# Patient Record
Sex: Female | Born: 1965
Health system: Southern US, Community
[De-identification: ages and names within clinical notes are randomized; demographics above are authoritative.]

## PROBLEM LIST (undated history)

## (undated) DIAGNOSIS — C029 Malignant neoplasm of tongue, unspecified: Secondary | ICD-10-CM

## (undated) DIAGNOSIS — I10 Essential (primary) hypertension: Secondary | ICD-10-CM

## (undated) DIAGNOSIS — E119 Type 2 diabetes mellitus without complications: Secondary | ICD-10-CM

## (undated) DIAGNOSIS — C801 Malignant (primary) neoplasm, unspecified: Secondary | ICD-10-CM

## (undated) DIAGNOSIS — E785 Hyperlipidemia, unspecified: Secondary | ICD-10-CM

## (undated) DIAGNOSIS — I639 Cerebral infarction, unspecified: Secondary | ICD-10-CM

## (undated) HISTORY — PX: TONGUE SURGERY: SHX810

## (undated) HISTORY — PX: BREAST EXCISIONAL BIOPSY: SUR124

## (undated) HISTORY — PX: BREAST LUMPECTOMY: SHX2

## (undated) HISTORY — PX: TRACHEOSTOMY: SUR1362

## (undated) HISTORY — DX: Type 2 diabetes mellitus without complications: E11.9

## (undated) HISTORY — DX: Malignant (primary) neoplasm, unspecified: C80.1

## (undated) HISTORY — DX: Hyperlipidemia, unspecified: E78.5

## (undated) HISTORY — DX: Malignant neoplasm of tongue, unspecified: C02.9

---

## 1989-12-18 HISTORY — PX: BREAST EXCISIONAL BIOPSY: SUR124

## 1999-10-19 HISTORY — PX: TONGUE SURGERY: SHX810

## 2003-01-12 ENCOUNTER — Ambulatory Visit (HOSPITAL_COMMUNITY): Admission: AD | Admit: 2003-01-12 | Discharge: 2003-01-12 | Payer: Self-pay | Admitting: Obstetrics & Gynecology

## 2003-01-16 ENCOUNTER — Ambulatory Visit (HOSPITAL_COMMUNITY): Admission: AD | Admit: 2003-01-16 | Discharge: 2003-01-16 | Payer: Self-pay | Admitting: Obstetrics & Gynecology

## 2003-01-21 ENCOUNTER — Ambulatory Visit (HOSPITAL_COMMUNITY): Admission: AD | Admit: 2003-01-21 | Discharge: 2003-01-21 | Payer: Self-pay | Admitting: Obstetrics & Gynecology

## 2003-01-21 ENCOUNTER — Ambulatory Visit (HOSPITAL_COMMUNITY): Admission: RE | Admit: 2003-01-21 | Discharge: 2003-01-21 | Payer: Self-pay | Admitting: Obstetrics & Gynecology

## 2003-01-26 ENCOUNTER — Ambulatory Visit (HOSPITAL_COMMUNITY): Admission: AD | Admit: 2003-01-26 | Discharge: 2003-01-26 | Payer: Self-pay | Admitting: Obstetrics & Gynecology

## 2003-01-30 ENCOUNTER — Ambulatory Visit (HOSPITAL_COMMUNITY): Admission: RE | Admit: 2003-01-30 | Discharge: 2003-01-30 | Payer: Self-pay | Admitting: Obstetrics & Gynecology

## 2003-02-02 ENCOUNTER — Ambulatory Visit (HOSPITAL_COMMUNITY): Admission: AD | Admit: 2003-02-02 | Discharge: 2003-02-02 | Payer: Self-pay | Admitting: Obstetrics & Gynecology

## 2003-02-06 ENCOUNTER — Inpatient Hospital Stay (HOSPITAL_COMMUNITY): Admission: RE | Admit: 2003-02-06 | Discharge: 2003-02-09 | Payer: Self-pay | Admitting: Obstetrics & Gynecology

## 2003-11-25 ENCOUNTER — Ambulatory Visit (HOSPITAL_COMMUNITY): Admission: RE | Admit: 2003-11-25 | Discharge: 2003-11-25 | Payer: Self-pay | Admitting: Obstetrics & Gynecology

## 2004-05-19 ENCOUNTER — Ambulatory Visit: Payer: Self-pay | Admitting: Endocrinology

## 2004-06-06 ENCOUNTER — Ambulatory Visit: Payer: Self-pay | Admitting: Endocrinology

## 2004-06-28 ENCOUNTER — Ambulatory Visit: Payer: Self-pay | Admitting: Endocrinology

## 2004-07-07 ENCOUNTER — Ambulatory Visit: Payer: Self-pay | Admitting: Endocrinology

## 2004-07-21 ENCOUNTER — Ambulatory Visit: Payer: Self-pay | Admitting: Endocrinology

## 2004-08-11 ENCOUNTER — Ambulatory Visit: Payer: Self-pay | Admitting: Endocrinology

## 2004-08-31 ENCOUNTER — Ambulatory Visit: Payer: Self-pay | Admitting: Endocrinology

## 2004-09-23 ENCOUNTER — Ambulatory Visit: Payer: Self-pay | Admitting: Endocrinology

## 2004-09-30 ENCOUNTER — Other Ambulatory Visit: Admission: RE | Admit: 2004-09-30 | Discharge: 2004-09-30 | Payer: Self-pay | Admitting: Endocrinology

## 2004-09-30 ENCOUNTER — Ambulatory Visit: Payer: Self-pay | Admitting: Endocrinology

## 2004-12-27 ENCOUNTER — Ambulatory Visit: Payer: Self-pay | Admitting: Endocrinology

## 2005-01-03 ENCOUNTER — Ambulatory Visit: Payer: Self-pay | Admitting: Endocrinology

## 2005-04-25 ENCOUNTER — Ambulatory Visit: Payer: Self-pay | Admitting: Endocrinology

## 2005-05-17 ENCOUNTER — Ambulatory Visit: Payer: Self-pay | Admitting: Endocrinology

## 2005-06-28 ENCOUNTER — Ambulatory Visit: Payer: Self-pay | Admitting: Endocrinology

## 2005-08-08 ENCOUNTER — Ambulatory Visit: Payer: Self-pay | Admitting: Endocrinology

## 2005-08-15 ENCOUNTER — Ambulatory Visit: Payer: Self-pay | Admitting: Endocrinology

## 2005-11-08 ENCOUNTER — Ambulatory Visit: Payer: Self-pay | Admitting: Endocrinology

## 2005-11-15 ENCOUNTER — Ambulatory Visit: Payer: Self-pay | Admitting: Endocrinology

## 2005-11-15 ENCOUNTER — Other Ambulatory Visit: Admission: RE | Admit: 2005-11-15 | Discharge: 2005-11-15 | Payer: Self-pay | Admitting: Endocrinology

## 2005-11-15 ENCOUNTER — Encounter: Payer: Self-pay | Admitting: Endocrinology

## 2006-10-03 ENCOUNTER — Encounter: Payer: Self-pay | Admitting: Endocrinology

## 2006-10-03 DIAGNOSIS — E109 Type 1 diabetes mellitus without complications: Secondary | ICD-10-CM | POA: Insufficient documentation

## 2007-01-16 ENCOUNTER — Ambulatory Visit: Payer: Self-pay | Admitting: Endocrinology

## 2007-01-16 LAB — CONVERTED CEMR LAB
ALT: 13 units/L (ref 0–35)
AST: 21 units/L (ref 0–37)
Albumin: 3.9 g/dL (ref 3.5–5.2)
Alkaline Phosphatase: 93 units/L (ref 39–117)
BUN: 10 mg/dL (ref 6–23)
Basophils Absolute: 0 10*3/uL (ref 0.0–0.1)
Basophils Relative: 0.6 % (ref 0.0–1.0)
Bilirubin Urine: NEGATIVE
Bilirubin, Direct: 0.1 mg/dL (ref 0.0–0.3)
CO2: 23 meq/L (ref 19–32)
Calcium: 8.4 mg/dL (ref 8.4–10.5)
Chloride: 102 meq/L (ref 96–112)
Cholesterol: 221 mg/dL (ref 0–200)
Creatinine, Ser: 0.6 mg/dL (ref 0.4–1.2)
Creatinine,U: 39.7 mg/dL
Direct LDL: 158.4 mg/dL
Eosinophils Absolute: 0.4 10*3/uL (ref 0.0–0.6)
Eosinophils Relative: 5.6 % — ABNORMAL HIGH (ref 0.0–5.0)
GFR calc Af Amer: 142 mL/min
GFR calc non Af Amer: 118 mL/min
Glucose, Bld: 161 mg/dL — ABNORMAL HIGH (ref 70–99)
HCT: 36.1 % (ref 36.0–46.0)
HDL: 46.4 mg/dL (ref >39.0)
Hemoglobin, Urine: NEGATIVE
Hemoglobin: 12.5 g/dL (ref 12.0–15.0)
Hgb A1c MFr Bld: 9.5 % — ABNORMAL HIGH (ref 4.6–6.0)
Ketones, ur: NEGATIVE mg/dL
Leukocytes, UA: NEGATIVE
Lymphocytes Relative: 30.9 % (ref 12.0–46.0)
MCHC: 34.6 g/dL (ref 30.0–36.0)
MCV: 89.5 fL (ref 78.0–100.0)
Microalb Creat Ratio: 12.6 mg/g (ref 0.0–30.0)
Microalb, Ur: 0.5 mg/dL (ref 0.0–1.9)
Monocytes Absolute: 0.4 10*3/uL (ref 0.2–0.7)
Monocytes Relative: 5.3 % (ref 3.0–11.0)
Neutro Abs: 4.6 10*3/uL (ref 1.4–7.7)
Neutrophils Relative %: 57.6 % (ref 43.0–77.0)
Nitrite: NEGATIVE
Platelets: 300 10*3/uL (ref 150–400)
Potassium: 3.3 meq/L — ABNORMAL LOW (ref 3.5–5.1)
RBC: 4.04 M/uL (ref 3.87–5.11)
RDW: 12.4 % (ref 11.5–14.6)
Sodium: 136 meq/L (ref 135–145)
Specific Gravity, Urine: <=1.005 (ref 1.000–1.03)
TSH: 1.7 microintl units/mL (ref 0.35–5.50)
Total Bilirubin: 0.4 mg/dL (ref 0.3–1.2)
Total CHOL/HDL Ratio: 4.8
Total Protein, Urine: NEGATIVE mg/dL
Total Protein: 7.8 g/dL (ref 6.0–8.3)
Triglycerides: 147 mg/dL (ref 0–149)
Urine Glucose: NEGATIVE mg/dL
Urobilinogen, UA: 0.2 (ref 0.0–1.0)
VLDL: 29 mg/dL (ref 0–40)
WBC: 7.8 10*3/uL (ref 4.5–10.5)
pH: 6 (ref 5.0–8.0)

## 2007-01-23 ENCOUNTER — Ambulatory Visit: Payer: Self-pay | Admitting: Endocrinology

## 2007-02-01 ENCOUNTER — Ambulatory Visit (HOSPITAL_COMMUNITY): Admission: RE | Admit: 2007-02-01 | Discharge: 2007-02-01 | Payer: Self-pay | Admitting: Endocrinology

## 2008-01-27 ENCOUNTER — Telehealth: Payer: Self-pay | Admitting: Endocrinology

## 2008-07-31 ENCOUNTER — Ambulatory Visit: Payer: Self-pay | Admitting: Endocrinology

## 2008-07-31 DIAGNOSIS — E785 Hyperlipidemia, unspecified: Secondary | ICD-10-CM | POA: Insufficient documentation

## 2008-08-03 ENCOUNTER — Ambulatory Visit: Payer: Self-pay | Admitting: Endocrinology

## 2008-08-03 LAB — CONVERTED CEMR LAB: Rubella: 71.6 intl units/mL — ABNORMAL HIGH

## 2008-08-24 ENCOUNTER — Ambulatory Visit: Payer: Self-pay | Admitting: Endocrinology

## 2010-04-17 LAB — CONVERTED CEMR LAB
ALT: 11 units/L (ref 0–35)
AST: 17 units/L (ref 0–37)
Albumin: 3.9 g/dL (ref 3.5–5.2)
Alkaline Phosphatase: 87 units/L (ref 39–117)
BUN: 9 mg/dL (ref 6–23)
Bilirubin Urine: NEGATIVE
Bilirubin, Direct: 0.1 mg/dL (ref 0.0–0.3)
CO2: 31 meq/L (ref 19–32)
Calcium: 9 mg/dL (ref 8.4–10.5)
Chloride: 104 meq/L (ref 96–112)
Cholesterol: 222 mg/dL — ABNORMAL HIGH (ref 0–200)
Creatinine, Ser: 0.6 mg/dL (ref 0.4–1.2)
Creatinine,U: 24.1 mg/dL
Direct LDL: 159.9 mg/dL
GFR calc non Af Amer: 141.28 mL/min (ref >60)
Glucose, Bld: 105 mg/dL — ABNORMAL HIGH (ref 70–99)
HDL: 47.1 mg/dL (ref >39.00)
Hgb A1c MFr Bld: 11.3 % — ABNORMAL HIGH (ref 4.6–6.5)
Ketones, ur: NEGATIVE mg/dL
Leukocytes, UA: NEGATIVE
Microalb Creat Ratio: 16.6 mg/g (ref 0.0–30.0)
Microalb, Ur: 0.4 mg/dL (ref 0.0–1.9)
Mumps IgG: 3.12 — ABNORMAL HIGH
Nitrite: NEGATIVE
Potassium: 4.1 meq/L (ref 3.5–5.1)
Rubeola IgG: 1.96 — ABNORMAL HIGH
Sodium: 143 meq/L (ref 135–145)
Specific Gravity, Urine: <=1.005 (ref 1.000–1.030)
TSH: 1.08 microintl units/mL (ref 0.35–5.50)
Total Bilirubin: 0.5 mg/dL (ref 0.3–1.2)
Total CHOL/HDL Ratio: 5
Total Protein, Urine: NEGATIVE mg/dL
Total Protein: 7.8 g/dL (ref 6.0–8.3)
Triglycerides: 82 mg/dL (ref 0.0–149.0)
Urine Glucose: NEGATIVE mg/dL
Urobilinogen, UA: 0.2 (ref 0.0–1.0)
VLDL: 16.4 mg/dL (ref 0.0–40.0)
pH: 5 (ref 5.0–8.0)

## 2010-08-05 NOTE — Discharge Summary (Signed)
NAME:  Cheryl Middleton, Cheryl Middleton                        ACCOUNT NO.:  [id number]   MEDICAL RECORD NO.:  [id number]                   PATIENT TYPE:  INP   LOCATION:  A418                                 FACILITY:  [facility]   PHYSICIAN:  Lazaro Arms, M.D.                DATE OF BIRTH:  12/05/1965   DATE OF ADMISSION:  02/06/2003  DATE OF DISCHARGE:  02/09/2003                                 DISCHARGE SUMMARY   DISCHARGE DIAGNOSES:  1. Status post a primary cesarean section due to inadequate bony pelvis.  2. Class B diabetes.   PROCEDURE:  Primary Caesarian section.  Please refer to the transcribed history and physical and the op note for  details for admission to hospital.   HOSPITAL COURSE:  The patient was admitted postoperatively.  She had an  unremarkable postop course.  Blood sugars were stable on a sliding scale of  insulin.  She tolerated clear liquids and a regular diet.  She voided  without symptoms,  remained ambulatory, and had progression with normal  bowel function, with flatus and bowel movement.  Her incision was clean, dry  and intact.  The baby did well as well.  She was discharged to home on the  morning of postop day #3 in good, stable condition.  Her hemoglobin and  hematocrit on postop day #3 was 10.5 and 31.1.  She remained afebrile.  She  was discharged to home to follow up in the office next week to have her  staples removed and incision evaluated.  She was given instructions and  precautions for return prior to that time.     ___________________________________________                                         Lazaro Arms, M.D.   [name]  D:  03/10/2003  T:  03/10/2003  Job:  [id number]

## 2010-08-05 NOTE — Op Note (Signed)
NAME:  Cheryl Middleton, Cheryl Middleton                        ACCOUNT NO.:  [id number]   MEDICAL RECORD NO.:  [id number]                   PATIENT TYPE:  AMB   LOCATION:  DAY                                  FACILITY:  [facility]   PHYSICIAN:  Lazaro Arms, M.D.                DATE OF BIRTH:  1965/07/10   DATE OF PROCEDURE:  02/06/2003  DATE OF DISCHARGE:                                 OPERATIVE REPORT   PREOPERATIVE DIAGNOSES:  1. Intrauterine pregnancy at [redacted] weeks gestation.  2. Inadequate bony pelvis.  3. Class B diabetes.   POSTOPERATIVE DIAGNOSES:  1. Intrauterine pregnancy at [redacted] weeks gestation.  2. Inadequate bony pelvis.  3. Class B diabetes.   PROCEDURE:  Primary low transverse cesarean section.   SURGEON:  Lazaro Arms, M.D.   ANESTHESIA:  Spinal.   FINDINGS:  Over a low transverse hysterotomy incision was delivered a viable  female at 0834 hours with Apgars of 9 and 9, weighing 6 pounds and 10 ounces,  there was three vessel cord, cord blood and cord gas were sent.  Placenta  was normal, intact, slightly calcified.  Uterus, tubes, and ovaries were  normal.   DESCRIPTION OF OPERATION:  Patient was taken to operating room, underwent  spinal anesthetic, placed in supine position and put a roll in her right  hip.  She was prepped and draped in the usual sterile fashion.  A  Pfannenstiel skin incision was made and carried down sharply to the rectus  fascia which was scored in the midline and extended laterally.  The fascia  was taken off the muscles superiorly and inferiorly without difficulty.  The  muscles were divided.  Peritoneal cavity was entered.  Bladder blade was  placed.  A vesicouterine serosal flap was created.  A low transverse  hysterotomy incision was made.  Over this incision was delivered a viable  female at 0834 hours with Apgars of 9 and 9, weighing 6 pounds and 10 ounces.  There was a three vessel cord.  The infant was handed to Dr. Milford Cage and then  labor delivery  nursery nurses who were in attendance for routine neonatal  resuscitation.  Placenta was delivered spontaneously, wiped clean with clean  lap pad and exteriorized.  The uterus was closed in two layers, the first  being running interlocking layer, the second being imbricating layer.  Three  interrupted figure-of-eight sutures were also placed for hemostasis.  The  uterus was replaced in the peritoneal cavity.  Both pericolic gutters were  irrigated vigorously and there was good hemostasis.  The muscles  reapproximated  loosely, the fascia was closed using 0 Vicryl running, subcutaneous tissue  was made hemostatic and irrigated, the skin was closed using skin staples.  The patient tolerated procedure well.  She experienced 500 mL blood loss and  was taken to recovery in good stable condition.  All counts were correct x3.  ___________________________________________                                            Lazaro Arms, M.D.   LHE/MEDQ  D:  02/06/2003  T:  02/06/2003  Job:  [id number]

## 2011-01-02 ENCOUNTER — Other Ambulatory Visit: Payer: Self-pay | Admitting: Family Medicine

## 2011-01-02 DIAGNOSIS — Z139 Encounter for screening, unspecified: Secondary | ICD-10-CM

## 2011-01-09 ENCOUNTER — Ambulatory Visit (HOSPITAL_COMMUNITY)
Admission: RE | Admit: 2011-01-09 | Discharge: 2011-01-09 | Disposition: A | Discharge status: Home / Self Care | Payer: BC Managed Care – PPO | Source: Ambulatory Visit | Attending: Family Medicine | Admitting: Family Medicine

## 2011-01-09 DIAGNOSIS — Z139 Encounter for screening, unspecified: Secondary | ICD-10-CM

## 2011-01-09 DIAGNOSIS — Z1231 Encounter for screening mammogram for malignant neoplasm of breast: Secondary | ICD-10-CM | POA: Insufficient documentation

## 2012-02-07 ENCOUNTER — Other Ambulatory Visit: Payer: Self-pay | Admitting: Family Medicine

## 2012-02-07 DIAGNOSIS — Z139 Encounter for screening, unspecified: Secondary | ICD-10-CM

## 2012-02-12 ENCOUNTER — Ambulatory Visit (HOSPITAL_COMMUNITY)
Admission: RE | Admit: 2012-02-12 | Discharge: 2012-02-12 | Disposition: A | Discharge status: Home / Self Care | Payer: BC Managed Care – PPO | Source: Ambulatory Visit | Attending: Family Medicine | Admitting: Family Medicine

## 2012-02-12 DIAGNOSIS — Z1231 Encounter for screening mammogram for malignant neoplasm of breast: Secondary | ICD-10-CM | POA: Insufficient documentation

## 2012-02-12 DIAGNOSIS — Z139 Encounter for screening, unspecified: Secondary | ICD-10-CM

## 2012-03-04 ENCOUNTER — Other Ambulatory Visit: Payer: Self-pay | Admitting: Obstetrics & Gynecology

## 2012-03-04 ENCOUNTER — Other Ambulatory Visit (HOSPITAL_COMMUNITY)
Admission: RE | Admit: 2012-03-04 | Discharge: 2012-03-04 | Disposition: A | Discharge status: Home / Self Care | Payer: BC Managed Care – PPO | Source: Ambulatory Visit | Attending: Obstetrics & Gynecology | Admitting: Obstetrics & Gynecology

## 2012-03-04 DIAGNOSIS — Z01419 Encounter for gynecological examination (general) (routine) without abnormal findings: Secondary | ICD-10-CM | POA: Insufficient documentation

## 2012-11-12 ENCOUNTER — Other Ambulatory Visit: Payer: Self-pay | Admitting: Family Medicine

## 2013-01-13 ENCOUNTER — Other Ambulatory Visit: Payer: Self-pay | Admitting: Family Medicine

## 2013-01-13 DIAGNOSIS — Z139 Encounter for screening, unspecified: Secondary | ICD-10-CM

## 2013-02-17 ENCOUNTER — Ambulatory Visit (HOSPITAL_COMMUNITY)
Admission: RE | Admit: 2013-02-17 | Discharge: 2013-02-17 | Disposition: A | Discharge status: Home / Self Care | Payer: BC Managed Care – PPO | Source: Ambulatory Visit | Attending: Family Medicine | Admitting: Family Medicine

## 2013-02-17 DIAGNOSIS — Z139 Encounter for screening, unspecified: Secondary | ICD-10-CM

## 2013-02-17 DIAGNOSIS — Z1231 Encounter for screening mammogram for malignant neoplasm of breast: Secondary | ICD-10-CM | POA: Insufficient documentation

## 2013-04-28 ENCOUNTER — Encounter: Payer: Self-pay | Admitting: Nurse Practitioner

## 2013-04-28 ENCOUNTER — Ambulatory Visit (INDEPENDENT_AMBULATORY_CARE_PROVIDER_SITE_OTHER): Payer: BC Managed Care – PPO | Admitting: Nurse Practitioner

## 2013-04-28 VITALS — BP 124/70 | Ht 61.5 in | Wt 157.1 lb

## 2013-04-28 DIAGNOSIS — Z124 Encounter for screening for malignant neoplasm of cervix: Secondary | ICD-10-CM

## 2013-04-28 DIAGNOSIS — Z01419 Encounter for gynecological examination (general) (routine) without abnormal findings: Secondary | ICD-10-CM

## 2013-04-28 DIAGNOSIS — Z0000 Encounter for general adult medical examination without abnormal findings: Secondary | ICD-10-CM

## 2013-04-29 LAB — PAP IG W/ RFLX HPV ASCU

## 2013-05-05 NOTE — Progress Notes (Signed)
   Subjective:    Patient ID: Cheryl Middleton, female    DOB: 06-15-1965, 48 y.o.   MRN: [id number]  HPI presents for wellness exam. Having mostly regular menstrual cycles light lasting 3-5 days. No current sexual partner. No pelvic pain. Sees Dr. Dorris Fetch for her diabetes including foot exam. Is currently on Victoza. Has noticed increase regurgitation. No abdominal pain. Gets regular vision and dental exams. Mammogram done in December.    Review of Systems  Constitutional: Negative for fever, activity change, appetite change and fatigue.  HENT: Negative for dental problem, ear pain, sinus pressure and sore throat.   Respiratory: Negative for cough, chest tightness, shortness of breath and wheezing.   Cardiovascular: Negative for chest pain and leg swelling.  Gastrointestinal: Positive for nausea. Negative for vomiting, abdominal pain, diarrhea and constipation.  Genitourinary: Negative for dysuria, urgency, frequency, vaginal discharge, difficulty urinating, genital sores, menstrual problem and pelvic pain.       Objective:   Physical Exam  Vitals reviewed. Constitutional: She is oriented to person, place, and time. She appears well-developed. No distress.  HENT:  Right Ear: External ear normal.  Left Ear: External ear normal.  Mouth/Throat: Oropharynx is clear and moist.  Neck: Normal range of motion. Neck supple. No tracheal deviation present. No thyromegaly present.  Cardiovascular: Normal rate, regular rhythm and normal heart sounds.  Exam reveals no gallop.   No murmur heard. Pulmonary/Chest: Effort normal and breath sounds normal.  Abdominal: Soft. She exhibits no distension. There is no tenderness.  Genitourinary: Vagina normal and uterus normal. No vaginal discharge found.  Musculoskeletal: She exhibits no edema.  Lymphadenopathy:    She has no cervical adenopathy.  Neurological: She is alert and oriented to person, place, and time.  Skin: Skin is warm and dry. No rash  noted.  Psychiatric: She has a normal mood and affect. Her behavior is normal.   Breast exam no masses noted, axillae no adenopathy. External GU normal, no rashes or lesions noted. Vagina clear discharge. No CMT. Bimanual exam no obvious masses or tenderness noted.        Assessment & Plan:  Well woman exam  Screening for cervical cancer - Plan: Pap IG w/ reflex to HPV when ASC-U  Recommend daily vitamin D and calcium supplementation. Healthy diet and regular activity. Next physical in one year.

## 2013-05-07 ENCOUNTER — Other Ambulatory Visit: Payer: Self-pay | Admitting: Obstetrics & Gynecology

## 2013-08-15 LAB — HEMOGLOBIN A1C: A1c: 8.8

## 2013-12-09 ENCOUNTER — Encounter: Payer: Self-pay | Admitting: *Deleted

## 2014-04-27 ENCOUNTER — Other Ambulatory Visit: Payer: Self-pay | Admitting: Family Medicine

## 2014-04-27 DIAGNOSIS — Z1231 Encounter for screening mammogram for malignant neoplasm of breast: Secondary | ICD-10-CM

## 2014-05-04 ENCOUNTER — Ambulatory Visit (HOSPITAL_COMMUNITY)
Admission: RE | Admit: 2014-05-04 | Discharge: 2014-05-04 | Disposition: A | Discharge status: Home / Self Care | Payer: BLUE CROSS/BLUE SHIELD | Source: Ambulatory Visit | Attending: Family Medicine | Admitting: Family Medicine

## 2014-05-04 DIAGNOSIS — Z1231 Encounter for screening mammogram for malignant neoplasm of breast: Secondary | ICD-10-CM | POA: Diagnosis not present

## 2014-05-06 LAB — HM DIABETES EYE EXAM

## 2014-05-14 ENCOUNTER — Ambulatory Visit (INDEPENDENT_AMBULATORY_CARE_PROVIDER_SITE_OTHER): Payer: BLUE CROSS/BLUE SHIELD | Admitting: Nurse Practitioner

## 2014-05-14 ENCOUNTER — Encounter: Payer: Self-pay | Admitting: Nurse Practitioner

## 2014-05-14 VITALS — BP 138/86 | Ht 61.5 in | Wt 154.8 lb

## 2014-05-14 DIAGNOSIS — Z0000 Encounter for general adult medical examination without abnormal findings: Secondary | ICD-10-CM | POA: Diagnosis not present

## 2014-05-14 DIAGNOSIS — Z01419 Encounter for gynecological examination (general) (routine) without abnormal findings: Secondary | ICD-10-CM

## 2014-05-18 ENCOUNTER — Encounter: Payer: Self-pay | Admitting: Nurse Practitioner

## 2014-05-18 NOTE — Progress Notes (Signed)
   Subjective:    Patient ID: Cheryl Middleton, female    DOB: 1965/07/18, 49 y.o.   MRN: [id number]  HPI presents for her wellness physical. Having irregular menses. Had one in October then another this month. Denies any extreme bleeding. No new sexual partners. Had labs done yesterday; will fax results. Sees Dr. Dorris Fetch for diabetes. Regular vision and dental exams. Active job but otherwise no regular exercise.    Review of Systems  Constitutional: Negative for fever, activity change, appetite change and fatigue.  HENT: Negative for dental problem, ear pain, sinus pressure and sore throat.   Respiratory: Negative for cough, chest tightness, shortness of breath and wheezing.   Cardiovascular: Negative for chest pain.  Gastrointestinal: Negative for nausea, vomiting, abdominal pain, diarrhea, constipation and abdominal distention.  Genitourinary: Positive for frequency. Negative for dysuria, urgency, vaginal discharge, enuresis, difficulty urinating, genital sores and pelvic pain.       Objective:   Physical Exam  Constitutional: She is oriented to person, place, and time. She appears well-developed. No distress.  HENT:  Right Ear: External ear normal.  Left Ear: External ear normal.  Mouth/Throat: Oropharynx is clear and moist.  Neck: Normal range of motion. Neck supple. No tracheal deviation present. No thyromegaly present.  Cardiovascular: Normal rate, regular rhythm and normal heart sounds.  Exam reveals no gallop.   No murmur heard. Pulmonary/Chest: Effort normal and breath sounds normal.  Abdominal: Soft. She exhibits no distension. There is no tenderness.  Genitourinary: Vagina normal and uterus normal. No vaginal discharge found.  External GU: no rashes or lesions. Vagina: no discharge. Cervix nl in appearance. No CMT. Bimanual exam no tenderness or masses.   Musculoskeletal: She exhibits no edema.  Lymphadenopathy:    She has no cervical adenopathy.  Neurological: She is  alert and oriented to person, place, and time.  Skin: Skin is warm and dry. No rash noted.  Psychiatric: She has a normal mood and affect. Her behavior is normal.  Vitals reviewed. Breast exam: no dominant masses; axillae no adenopathy.        Assessment & Plan:  Well woman exam  Encouraged healthy diet, regular exercise and daily vitamin D/calcium supplementation. Continue follow up with Dr. Dorris Fetch for diabetes and cholesterol.  Return in about 1 year (around 05/15/2015) for physical.

## 2015-01-09 ENCOUNTER — Other Ambulatory Visit: Payer: Self-pay | Admitting: "Endocrinology

## 2015-03-04 LAB — HEMOGLOBIN A1C: Hemoglobin A1C: 11.2

## 2015-03-12 ENCOUNTER — Ambulatory Visit: Payer: BLUE CROSS/BLUE SHIELD | Admitting: "Endocrinology

## 2015-03-16 ENCOUNTER — Encounter: Payer: Self-pay | Admitting: "Endocrinology

## 2015-03-16 ENCOUNTER — Ambulatory Visit (INDEPENDENT_AMBULATORY_CARE_PROVIDER_SITE_OTHER): Payer: BLUE CROSS/BLUE SHIELD | Admitting: "Endocrinology

## 2015-03-16 VITALS — BP 121/79 | HR 87 | Ht 62.0 in | Wt 155.0 lb

## 2015-03-16 DIAGNOSIS — Z794 Long term (current) use of insulin: Secondary | ICD-10-CM | POA: Insufficient documentation

## 2015-03-16 DIAGNOSIS — E785 Hyperlipidemia, unspecified: Secondary | ICD-10-CM

## 2015-03-16 DIAGNOSIS — I10 Essential (primary) hypertension: Secondary | ICD-10-CM | POA: Diagnosis not present

## 2015-03-16 DIAGNOSIS — E1165 Type 2 diabetes mellitus with hyperglycemia: Secondary | ICD-10-CM | POA: Diagnosis not present

## 2015-03-16 DIAGNOSIS — IMO0002 Reserved for concepts with insufficient information to code with codable children: Secondary | ICD-10-CM

## 2015-03-16 DIAGNOSIS — E118 Type 2 diabetes mellitus with unspecified complications: Secondary | ICD-10-CM

## 2015-03-16 NOTE — Progress Notes (Signed)
Subjective:    Patient ID: Cheryl Middleton, female    DOB: Nov 29, 1965, PCP Sallee Lange, MD   Past Medical History  Diagnosis Date  . Hyperlipidemia   . Diabetes mellitus, type II ([place])   . Malignant neoplasm of tongue [facility])    Past Surgical History  Procedure Laterality Date  . Breast lumpectomy    . Tracheostomy     Social History   Social History  . Marital Status: Legally Separated    Spouse Name: N/A  . Number of Children: N/A  . Years of Education: N/A   Social History Main Topics  . Smoking status: Never Smoker   . Smokeless tobacco: Never Used  . Alcohol Use: No  . Drug Use: No  . Sexual Activity: Not Currently    Birth Control/ Protection: None   Other Topics Concern  . None   Social History Narrative   Outpatient Encounter Prescriptions as of 03/16/2015  Medication Sig  . insulin NPH-regular Human (NOVOLIN 70/30) (70-30) 100 UNIT/ML injection Inject 20 Units into the skin 2 (two) times daily with a meal.  . INVOKANA 100 MG TABS tablet Take 100 mg by mouth daily.  . metFORMIN (GLUCOPHAGE-XR) 750 MG 24 hr tablet TAKE 1 TABLET BY MOUTH EVERY DAY  . pravastatin (PRAVACHOL) 20 MG tablet TAKE 1 TABLET BY MOUTH AT BEDTIME  . [DISCONTINUED] insulin glargine (LANTUS) 100 UNIT/ML injection Inject 30 Units into the skin at bedtime.  . [DISCONTINUED] Liraglutide (VICTOZA) 18 MG/3ML SOPN Inject 1.2 Units into the skin daily.   No facility-administered encounter medications on file as of 03/16/2015.   ALLERGIES: Allergies  Allergen Reactions  . Ciprofloxacin   . Penicillins   . Sulfonamide Derivatives    VACCINATION STATUS: Immunization History  Administered Date(s) Administered  . Td 07/31/2008    Diabetes She presents for her follow-up diabetic visit. She has type 2 diabetes mellitus. Onset time: She was diagnosed at approximate age of 24 years. Her disease course has been worsening. There are no hypoglycemic associated symptoms. Pertinent negatives  for hypoglycemia include no confusion, headaches, pallor or seizures. Associated symptoms include fatigue. Pertinent negatives for diabetes include no chest pain, no polydipsia, no polyphagia and no polyuria. There are no hypoglycemic complications. Symptoms are worsening. There are no diabetic complications. Risk factors for coronary artery disease include diabetes mellitus, dyslipidemia, hypertension and sedentary lifestyle. Current diabetic treatment includes oral agent (monotherapy). She is compliant with treatment some of the time. She is following a generally unhealthy diet. When asked about meal planning, she reported none. She has not had a previous visit with a dietitian. She never participates in exercise. Home blood sugar record trend: She did not bring the meter nor log. She was supposed to be monitoring at least twice a day along with use of insulin, however she did not use her insulin saying "not convenient to take the vials" to work.  Hyperlipidemia This is a chronic problem. The current episode started more than 1 year ago. Pertinent negatives include no chest pain, myalgias or shortness of breath. Current antihyperlipidemic treatment includes statins. Risk factors for coronary artery disease include dyslipidemia, diabetes mellitus, hypertension and a sedentary lifestyle.  Hypertension This is a chronic problem. The current episode started more than 1 year ago. Pertinent negatives include no chest pain, headaches, palpitations or shortness of breath. Risk factors for coronary artery disease include diabetes mellitus, dyslipidemia and sedentary lifestyle. Compliance problems include diet, exercise, medication cost and psychosocial issues.  Review of Systems  Constitutional: Positive for fatigue. Negative for fever, chills and unexpected weight change.  HENT: Negative for trouble swallowing and voice change.   Eyes: Negative for visual disturbance.  Respiratory: Negative for cough,  shortness of breath and wheezing.   Cardiovascular: Negative for chest pain, palpitations and leg swelling.  Gastrointestinal: Negative for nausea, vomiting and diarrhea.  Endocrine: Negative for cold intolerance, heat intolerance, polydipsia, polyphagia and polyuria.  Musculoskeletal: Negative for myalgias and arthralgias.  Skin: Negative for color change, pallor, rash and wound.  Neurological: Negative for seizures and headaches.  Psychiatric/Behavioral: Negative for suicidal ideas and confusion.    Objective:    BP 121/79 mmHg  Pulse 87  Ht 5\' 2"  (1.575 m)  Wt 155 lb (70.308 kg)  BMI 28.34 kg/m2  SpO2 98%  Wt Readings from Last 3 Encounters:  03/16/15 155 lb (70.308 kg)  05/14/14 154 lb 12.8 oz (70.217 kg)  04/28/13 157 lb 2 oz (71.271 kg)    Physical Exam  Constitutional: She is oriented to person, place, and time. She appears well-developed.  HENT:  Head: Normocephalic and atraumatic.  Eyes: EOM are normal.  Neck: Normal range of motion. Neck supple. No tracheal deviation present. No thyromegaly present.  Cardiovascular: Normal rate and regular rhythm.   Pulmonary/Chest: Effort normal and breath sounds normal.  Abdominal: Soft. Bowel sounds are normal. There is no tenderness. There is no guarding.  Musculoskeletal: Normal range of motion. She exhibits no edema.  Neurological: She is alert and oriented to person, place, and time. She has normal reflexes. No cranial nerve deficit. Coordination normal.  Skin: Skin is warm and dry. No rash noted. No erythema. No pallor.  Psychiatric: She has a normal mood and affect. Judgment normal.    CMP     Component Value Date/Time   NA 143 07/31/2008 0000   K 4.1 07/31/2008 0000   CL 104 07/31/2008 0000   CO2 31 07/31/2008 0000   GLUCOSE 105* 07/31/2008 0000   BUN 9 07/31/2008 0000   CREATININE 0.6 07/31/2008 0000   CALCIUM 9.0 07/31/2008 0000   PROT 7.8 07/31/2008 0000   ALBUMIN 3.9 07/31/2008 0000   AST 17 07/31/2008 0000    ALT 11 07/31/2008 0000   ALKPHOS 87 07/31/2008 0000   BILITOT 0.5 07/31/2008 0000   GFRNONAA 141.28 07/31/2008 0000   GFRAA 142 01/16/2007 0734     Diabetic Labs (most recent): Lab Results  Component Value Date   HGBA1C 11.2 03/04/2015   HGBA1C 11.3* 07/31/2008   HGBA1C 9.5* 01/16/2007    Lipid Panel     Component Value Date/Time   CHOL 222* 07/31/2008 0000   TRIG 82.0 07/31/2008 0000   HDL 47.10 07/31/2008 0000   CHOLHDL 5 07/31/2008 0000   VLDL 16.4 07/31/2008 0000   LDLDIRECT 159.9 07/31/2008 0000     Assessment & Plan:   1. Uncontrolled type 2 diabetes mellitus with complication, with long-term current use of insulin ([place])  - patient remains at a high risk for more acute and chronic complications of diabetes which include CAD, CVA, CKD, retinopathy, and neuropathy. These are all discussed in detail with the patient.  Patient came with out any meter nor blood glucose profile, and  recent A1c higher at 11.2 %.  Recent labs reviewed.   - I have re-counseled the patient on diet management   by adopting a carbohydrate restricted / protein rich  Diet.  - Suggestion is made for patient to avoid simple carbohydrates  from their diet including Cakes , Desserts, Ice Cream,  Soda (  diet and regular) , Sweet Tea , Candies,  Chips, Cookies, Artificial Sweeteners,   and "Sugar-free" Products .  This will help patient to have stable blood glucose profile and potentially avoid unintended  Weight gain.  - Patient is advised to stick to a routine mealtimes to eat 3 meals  a day and avoid unnecessary snacks ( to snack only to correct hypoglycemia).  - The patient  will be  scheduled with Jearld Fenton, RDN, CDE for individualized DM education.  - I have approached patient with the following individualized plan to manage diabetes and patient agrees.  - Based on her A1c, she would need basal bolus insulin, however she hesitates to engage. However she is willing to use Novolin 70/30  properly going forward. - I will resume Novolin 70/3020 units with breakfast and 20 units with supper for pre-meal glycemia above 90 mg/dL.  -She is advised to resume strict strict monitoring of glucose  AC  breakfast and supper, at least 2 times a day for safe use of insulin. - Patient is warned not to take insulin without proper monitoring per orders. -Adjustment parameters are given for hypo and hyperglycemia in writing. -Patient is encouraged to call clinic for blood glucose levels less than 70 or above 300 mg /dl. - I will continue Invokana 100 mg once a day and Glucophage 750 mg once a day, therapeutically suitable for patient.  - Patient specific target  for A1c; LDL, HDL, Triglycerides, and  Waist Circumference were discussed in detail.  2) BP/HTN: Controlled. Continue current medications. 3) Lipids/HPL:  continue statins. 4)  Weight/Diet: CDE consult  will be initiated , exercise, and carbohydrates information provided.  5) Chronic Care/Health Maintenance:  -Patient is on Statin medications and encouraged to continue to follow up with Ophthalmology, Podiatrist at least yearly or according to recommendations, and advised to  stay away from smoking. I have recommended yearly flu vaccine and pneumonia vaccination at least every 5 years; moderate intensity exercise for up to 150 minutes weekly; and  sleep for at least 7 hours a day.  - 25 minutes of time was spent on the care of this patient , 50% of which was applied for counseling on diabetes complications and their preventions.  - I advised patient to maintain close follow up with Sallee Lange, MD for primary care needs.  Patient is asked to bring meter and  blood glucose logs during their next visit.   Follow up plan: -Return in about 4 weeks (around 04/13/2015) for diabetes, high blood pressure, high cholesterol, follow up with meter and logs- no labs.  Glade Lloyd, MD Phone: [phone]  Fax: [phone]   03/16/2015, 10:55  AM

## 2015-03-16 NOTE — Patient Instructions (Signed)

## 2015-04-14 ENCOUNTER — Encounter: Payer: Self-pay | Admitting: "Endocrinology

## 2015-04-14 ENCOUNTER — Ambulatory Visit (INDEPENDENT_AMBULATORY_CARE_PROVIDER_SITE_OTHER): Payer: BLUE CROSS/BLUE SHIELD | Admitting: "Endocrinology

## 2015-04-14 VITALS — BP 125/77 | HR 72 | Ht 62.0 in | Wt 159.0 lb

## 2015-04-14 DIAGNOSIS — IMO0002 Reserved for concepts with insufficient information to code with codable children: Secondary | ICD-10-CM

## 2015-04-14 DIAGNOSIS — Z9119 Patient's noncompliance with other medical treatment and regimen: Secondary | ICD-10-CM | POA: Diagnosis not present

## 2015-04-14 DIAGNOSIS — I10 Essential (primary) hypertension: Secondary | ICD-10-CM

## 2015-04-14 DIAGNOSIS — E118 Type 2 diabetes mellitus with unspecified complications: Secondary | ICD-10-CM | POA: Diagnosis not present

## 2015-04-14 DIAGNOSIS — E785 Hyperlipidemia, unspecified: Secondary | ICD-10-CM

## 2015-04-14 DIAGNOSIS — Z91199 Patient's noncompliance with other medical treatment and regimen due to unspecified reason: Secondary | ICD-10-CM | POA: Insufficient documentation

## 2015-04-14 DIAGNOSIS — Z794 Long term (current) use of insulin: Secondary | ICD-10-CM

## 2015-04-14 DIAGNOSIS — E1165 Type 2 diabetes mellitus with hyperglycemia: Secondary | ICD-10-CM | POA: Diagnosis not present

## 2015-04-14 NOTE — Patient Instructions (Signed)

## 2015-04-14 NOTE — Progress Notes (Signed)
Subjective:    Patient ID: Cheryl Middleton, female    DOB: 1965/08/30, PCP Sallee Lange, MD   Past Medical History  Diagnosis Date  . Hyperlipidemia   . Diabetes mellitus, type II ([place])   . Malignant neoplasm of tongue [facility])    Past Surgical History  Procedure Laterality Date  . Breast lumpectomy    . Tracheostomy     Social History   Social History  . Marital Status: Legally Separated    Spouse Name: N/A  . Number of Children: N/A  . Years of Education: N/A   Social History Main Topics  . Smoking status: Never Smoker   . Smokeless tobacco: Never Used  . Alcohol Use: No  . Drug Use: No  . Sexual Activity: Not Currently    Birth Control/ Protection: None   Other Topics Concern  . None   Social History Narrative   Outpatient Encounter Prescriptions as of 04/14/2015  Medication Sig  . insulin NPH-regular Human (NOVOLIN 70/30) (70-30) 100 UNIT/ML injection Inject 20 Units into the skin 2 (two) times daily with a meal.  . INVOKANA 100 MG TABS tablet Take 100 mg by mouth daily.  . metFORMIN (GLUCOPHAGE-XR) 750 MG 24 hr tablet TAKE 1 TABLET BY MOUTH EVERY DAY  . pravastatin (PRAVACHOL) 20 MG tablet TAKE 1 TABLET BY MOUTH AT BEDTIME   No facility-administered encounter medications on file as of 04/14/2015.   ALLERGIES: Allergies  Allergen Reactions  . Ciprofloxacin   . Penicillins   . Sulfonamide Derivatives    VACCINATION STATUS: Immunization History  Administered Date(s) Administered  . Td 07/31/2008    Diabetes She presents for her follow-up diabetic visit. She has type 2 diabetes mellitus. Onset time: She was diagnosed at approximate age of 32 years. Her disease course has been worsening. There are no hypoglycemic associated symptoms. Pertinent negatives for hypoglycemia include no confusion, headaches, pallor or seizures. Associated symptoms include fatigue and polydipsia. Pertinent negatives for diabetes include no chest pain, no polyphagia and no  polyuria. There are no hypoglycemic complications. Symptoms are improving. There are no diabetic complications. Risk factors for coronary artery disease include diabetes mellitus, dyslipidemia, hypertension and sedentary lifestyle. Current diabetic treatment includes oral agent (monotherapy). She is compliant with treatment some of the time. Her weight is increasing steadily. She is following a generally unhealthy diet. When asked about meal planning, she reported none. She has not had a previous visit with a dietitian. She never participates in exercise. Home blood sugar record trend: She did not bring her log. She was supposed to be monitoring at least twice a day along with use of insulin, however she did not  monitor adequately. Her overall blood glucose range is 140-180 mg/dl.  Hyperlipidemia This is a chronic problem. The current episode started more than 1 year ago. Pertinent negatives include no chest pain, myalgias or shortness of breath. Current antihyperlipidemic treatment includes statins. Risk factors for coronary artery disease include dyslipidemia, diabetes mellitus, hypertension and a sedentary lifestyle.  Hypertension This is a chronic problem. The current episode started more than 1 year ago. Pertinent negatives include no chest pain, headaches, palpitations or shortness of breath. Risk factors for coronary artery disease include diabetes mellitus, dyslipidemia and sedentary lifestyle. Compliance problems include diet, exercise, medication cost and psychosocial issues.      Review of Systems  Constitutional: Positive for fatigue. Negative for fever, chills and unexpected weight change.  HENT: Negative for trouble swallowing and voice change.  Eyes: Negative for visual disturbance.  Respiratory: Negative for cough, shortness of breath and wheezing.   Cardiovascular: Negative for chest pain, palpitations and leg swelling.  Gastrointestinal: Negative for nausea, vomiting and diarrhea.   Endocrine: Positive for polydipsia. Negative for cold intolerance, heat intolerance, polyphagia and polyuria.  Musculoskeletal: Negative for myalgias and arthralgias.  Skin: Negative for color change, pallor, rash and wound.  Neurological: Negative for seizures and headaches.  Psychiatric/Behavioral: Negative for suicidal ideas and confusion.    Objective:    BP 125/77 mmHg  Pulse 72  Ht 5\' 2"  (1.575 m)  Wt 159 lb (72.122 kg)  BMI 29.07 kg/m2  SpO2 95%  Wt Readings from Last 3 Encounters:  04/14/15 159 lb (72.122 kg)  03/16/15 155 lb (70.308 kg)  05/14/14 154 lb 12.8 oz (70.217 kg)    Physical Exam  Constitutional: She is oriented to person, place, and time. She appears well-developed.  HENT:  Head: Normocephalic and atraumatic.  Eyes: EOM are normal.  Neck: Normal range of motion. Neck supple. No tracheal deviation present. No thyromegaly present.  Cardiovascular: Normal rate and regular rhythm.   Pulmonary/Chest: Effort normal and breath sounds normal.  Abdominal: Soft. Bowel sounds are normal. There is no tenderness. There is no guarding.  Musculoskeletal: Normal range of motion. She exhibits no edema.  Neurological: She is alert and oriented to person, place, and time. She has normal reflexes. No cranial nerve deficit. Coordination normal.  Skin: Skin is warm and dry. No rash noted. No erythema. No pallor.  Psychiatric: She has a normal mood and affect. Judgment normal.    CMP     Component Value Date/Time   NA 143 07/31/2008 0000   K 4.1 07/31/2008 0000   CL 104 07/31/2008 0000   CO2 31 07/31/2008 0000   GLUCOSE 105* 07/31/2008 0000   BUN 9 07/31/2008 0000   CREATININE 0.6 07/31/2008 0000   CALCIUM 9.0 07/31/2008 0000   PROT 7.8 07/31/2008 0000   ALBUMIN 3.9 07/31/2008 0000   AST 17 07/31/2008 0000   ALT 11 07/31/2008 0000   ALKPHOS 87 07/31/2008 0000   BILITOT 0.5 07/31/2008 0000   GFRNONAA 141.28 07/31/2008 0000   GFRAA 142 01/16/2007 0734      Diabetic Labs (most recent): Lab Results  Component Value Date   HGBA1C 11.2 03/04/2015   HGBA1C 11.3* 07/31/2008   HGBA1C 9.5* 01/16/2007    Lipid Panel     Component Value Date/Time   CHOL 222* 07/31/2008 0000   TRIG 82.0 07/31/2008 0000   HDL 47.10 07/31/2008 0000   CHOLHDL 5 07/31/2008 0000   VLDL 16.4 07/31/2008 0000   LDLDIRECT 159.9 07/31/2008 0000     Assessment & Plan:   1. Uncontrolled type 2 diabetes mellitus with complication, with long-term current use of insulin ([place])  - patient remains at a high risk for more acute and chronic complications of diabetes which include CAD, CVA, CKD, retinopathy, and neuropathy. These are all discussed in detail with the patient.  Patient came with out  Her logs and her meter shows only 1 test a day, despite my advice for her to test 4 times a day. Her recent A1c was  higher at 11.2 %.  She remains non compliant, her EAG is 161.  Recent labs reviewed.   - I have re-counseled the patient on diet management   by adopting a carbohydrate restricted / protein rich  Diet.  - Suggestion is made for patient to avoid simple carbohydrates  from their diet including Cakes , Desserts, Ice Cream,  Soda (  diet and regular) , Sweet Tea , Candies,  Chips, Cookies, Artificial Sweeteners,   and "Sugar-free" Products .  This will help patient to have stable blood glucose profile and potentially avoid unintended  Weight gain.  - Patient is advised to stick to a routine mealtimes to eat 3 meals  a day and avoid unnecessary snacks ( to snack only to correct hypoglycemia).  - The patient  will be  scheduled with Jearld Fenton, RDN, CDE for individualized DM education.  - I have approached patient with the following individualized plan to manage diabetes and patient agrees.  - Based on her A1c, she would need basal bolus insulin, however she hesitates to engage. However she is willing to use Novolin 70/30 properly going forward. - I urged her  to  resume Novolin 70/30 20 units with breakfast and 20 units with supper for pre-meal glycemia above 90 mg/dL.  -She is advised to resume strict strict monitoring of glucose  AC  breakfast and supper, at least 2 times a day for safe use of insulin. - Patient is warned not to take insulin without proper monitoring per orders. -Adjustment parameters are given for hypo and hyperglycemia in writing. -Patient is encouraged to call clinic for blood glucose levels less than 70 or above 300 mg /dl. - I will continue Invokana 100 mg once a day and Glucophage 750 mg once a day, therapeutically suitable for patient.  - Patient specific target  for A1c; LDL, HDL, Triglycerides, and  Waist Circumference were discussed in detail.  2) BP/HTN: Controlled. Continue current medications. 3) Lipids/HPL:  continue statins. 4)  Weight/Diet: CDE consult  will be initiated , exercise, and carbohydrates information provided.  5) Chronic Care/Health Maintenance:  -Patient is on Statin medications and encouraged to continue to follow up with Ophthalmology, Podiatrist at least yearly or according to recommendations, and advised to  stay away from smoking. I have recommended yearly flu vaccine and pneumonia vaccination at least every 5 years; moderate intensity exercise for up to 150 minutes weekly; and  sleep for at least 7 hours a day.  - 25 minutes of time was spent on the care of this patient , 50% of which was applied for counseling on diabetes complications and their preventions.  - I advised patient to maintain close follow up with Sallee Lange, MD for primary care needs.  Patient is asked to bring meter and  blood glucose logs during their next visit.   Follow up plan: -Return in about 3 months (around 07/13/2015) for diabetes, high blood pressure, high cholesterol, follow up with pre-visit labs, meter, and logs.  Glade Lloyd, MD Phone: [phone]  Fax: [phone]   04/14/2015, 4:27 PM

## 2015-04-16 LAB — HM DIABETES EYE EXAM

## 2015-04-26 ENCOUNTER — Other Ambulatory Visit: Payer: Self-pay | Admitting: Nurse Practitioner

## 2015-04-26 DIAGNOSIS — Z1231 Encounter for screening mammogram for malignant neoplasm of breast: Secondary | ICD-10-CM

## 2015-05-17 ENCOUNTER — Ambulatory Visit (INDEPENDENT_AMBULATORY_CARE_PROVIDER_SITE_OTHER): Payer: BLUE CROSS/BLUE SHIELD | Admitting: Nurse Practitioner

## 2015-05-17 ENCOUNTER — Ambulatory Visit (HOSPITAL_COMMUNITY)
Admission: RE | Admit: 2015-05-17 | Discharge: 2015-05-17 | Disposition: A | Discharge status: Home / Self Care | Payer: BLUE CROSS/BLUE SHIELD | Source: Ambulatory Visit | Attending: Nurse Practitioner | Admitting: Nurse Practitioner

## 2015-05-17 ENCOUNTER — Encounter: Payer: Self-pay | Admitting: Nurse Practitioner

## 2015-05-17 VITALS — BP 136/82 | Ht 61.5 in | Wt 162.6 lb

## 2015-05-17 DIAGNOSIS — Z0000 Encounter for general adult medical examination without abnormal findings: Secondary | ICD-10-CM

## 2015-05-17 DIAGNOSIS — IMO0002 Reserved for concepts with insufficient information to code with codable children: Secondary | ICD-10-CM

## 2015-05-17 DIAGNOSIS — E118 Type 2 diabetes mellitus with unspecified complications: Secondary | ICD-10-CM

## 2015-05-17 DIAGNOSIS — Z794 Long term (current) use of insulin: Secondary | ICD-10-CM

## 2015-05-17 DIAGNOSIS — E1165 Type 2 diabetes mellitus with hyperglycemia: Secondary | ICD-10-CM | POA: Diagnosis not present

## 2015-05-17 DIAGNOSIS — Z01419 Encounter for gynecological examination (general) (routine) without abnormal findings: Secondary | ICD-10-CM

## 2015-05-17 DIAGNOSIS — Z1231 Encounter for screening mammogram for malignant neoplasm of breast: Secondary | ICD-10-CM | POA: Insufficient documentation

## 2015-05-17 DIAGNOSIS — R928 Other abnormal and inconclusive findings on diagnostic imaging of breast: Secondary | ICD-10-CM | POA: Insufficient documentation

## 2015-05-18 LAB — MICROALBUMIN / CREATININE URINE RATIO: Creatinine, Urine: 20.7 mg/dL

## 2015-05-21 ENCOUNTER — Other Ambulatory Visit: Payer: Self-pay | Admitting: Nurse Practitioner

## 2015-05-21 DIAGNOSIS — R928 Other abnormal and inconclusive findings on diagnostic imaging of breast: Secondary | ICD-10-CM

## 2015-05-22 ENCOUNTER — Encounter: Payer: Self-pay | Admitting: Nurse Practitioner

## 2015-05-22 NOTE — Progress Notes (Signed)
   Subjective:    Patient ID: Cheryl Middleton, female    DOB: [birth year], 50 y.o.   MRN: [id number]  HPI presents for her wellness exam. Having a menses every 2-3 months. Normal flow. No new sexual partners. Regular vision and dental exams. Mammogram scheduled today. Regular activity. Sees Dr. Dorris Fetch for her diabetes.     Review of Systems  Constitutional: Negative for activity change, appetite change and fatigue.  HENT: Negative for dental problem, ear pain, sinus pressure and sore throat.   Respiratory: Negative for cough, chest tightness, shortness of breath and wheezing.   Cardiovascular: Negative for chest pain.  Gastrointestinal: Negative for nausea, vomiting, abdominal pain, diarrhea, constipation and abdominal distention.  Genitourinary: Negative for dysuria, urgency, frequency, vaginal discharge, enuresis, difficulty urinating, genital sores and pelvic pain.       Objective:   Physical Exam  Constitutional: She is oriented to person, place, and time. She appears well-developed. No distress.  HENT:  Right Ear: External ear normal.  Left Ear: External ear normal.  Mouth/Throat: Oropharynx is clear and moist.  Neck: Normal range of motion. Neck supple. No tracheal deviation present. No thyromegaly present.  Cardiovascular: Normal rate, regular rhythm and normal heart sounds.  Exam reveals no gallop.   No murmur heard. Pulmonary/Chest: Effort normal and breath sounds normal.  Abdominal: Soft. She exhibits no distension. There is no tenderness.  Genitourinary: Vagina normal and uterus normal. No vaginal discharge found.  External GU: no rashes or lesions. Vagina: no discharge. Cervix normal in appearance; no CMT. Bimanual exam: no tenderness or obvious masses.   Musculoskeletal: She exhibits no edema.  Lymphadenopathy:    She has no cervical adenopathy.  Neurological: She is alert and oriented to person, place, and time.  Skin: Skin is warm and dry. No rash noted.    Psychiatric: She has a normal mood and affect. Her behavior is normal.  Vitals reviewed. Breast exam: no masses; axillae no adenopathy. Diabetic Foot Exam - Simple   Simple Foot Form  Diabetic Foot exam was performed with the following findings:  Yes 05/17/2015  3:40 PM  Visual Inspection  No deformities, no ulcerations, no other skin breakdown bilaterally:  Yes  Sensation Testing  Intact to touch and monofilament testing bilaterally:  Yes  Pulse Check  See comments:  Yes  Comments  DP pulses present bilat; toes warm with normal cap refill.              Assessment & Plan:   Problem List Items Addressed This Visit      Endocrine   Uncontrolled type 2 diabetes mellitus with complication, with long-term current use of insulin ([place])   Relevant Orders   Microalbumin / creatinine urine ratio (Completed)    Other Visit Diagnoses    Well woman exam    -  Primary      Recommend healthy diet, weight loss and daily vitamin D/calcium. Return in about 1 year (around 05/16/2016) for physical.

## 2015-05-24 ENCOUNTER — Other Ambulatory Visit: Payer: Self-pay | Admitting: "Endocrinology

## 2015-05-25 ENCOUNTER — Other Ambulatory Visit (HOSPITAL_COMMUNITY): Payer: Self-pay | Admitting: Nurse Practitioner

## 2015-05-25 ENCOUNTER — Encounter: Payer: Self-pay | Admitting: Family Medicine

## 2015-05-25 ENCOUNTER — Ambulatory Visit (INDEPENDENT_AMBULATORY_CARE_PROVIDER_SITE_OTHER): Payer: BLUE CROSS/BLUE SHIELD | Admitting: Family Medicine

## 2015-05-25 VITALS — BP 148/82 | Temp 98.8°F | Ht 61.5 in | Wt 169.0 lb

## 2015-05-25 DIAGNOSIS — S161XXA Strain of muscle, fascia and tendon at neck level, initial encounter: Secondary | ICD-10-CM

## 2015-05-25 DIAGNOSIS — R928 Other abnormal and inconclusive findings on diagnostic imaging of breast: Secondary | ICD-10-CM

## 2015-05-25 DIAGNOSIS — R21 Rash and other nonspecific skin eruption: Secondary | ICD-10-CM

## 2015-05-25 MED ORDER — HYDROCODONE-ACETAMINOPHEN 5-325 MG PO TABS: 1.0000 | ORAL_TABLET | Freq: Four times a day (QID) | ORAL | Status: DC | PRN

## 2015-05-25 MED ORDER — CHLORZOXAZONE 500 MG PO TABS: 500.0000 mg | ORAL_TABLET | Freq: Three times a day (TID) | ORAL | Status: DC | PRN

## 2015-05-25 MED ORDER — PREDNISONE 10 MG PO TABS: ORAL_TABLET | ORAL | Status: DC

## 2015-05-25 NOTE — Progress Notes (Signed)
   Subjective:    Patient ID: Cheryl Middleton, female    DOB: 02-Jun-1965, 50 y.o.   MRN: [id number]  Rash This is a new problem. The current episode started yesterday. Location: all over from neck down. The rash is characterized by itchiness. Treatments tried: benadryl.   Neck pain on 5 days ago. Was taking ibuprofen and lidocaine patch.   Neck and shoulder pain tendr in tha tarea  Hx of stiff neck but gemerally not pain ''  Took ibuprofen 800 every six hrs  Tried lidocaine patch which did not help a lto   Review of Systems  Skin: Positive for rash.   no headache no chest pain no shortness breath     Objective:   Physical Exam  Alert no major distress right lateral posterior cervical tenderness to deep palpation some pain with rotation neck TMs pharynx normal lungs clear. Heart regular in rhythm. He impressive diffuse coalescing erythematous warm rash pruritic in nature      Assessment & Plan:  Impression probable ibuprofen allergy with rash occurring after several days of high-dose ibuprofen. #2 lateral cervical strain discussed plan prednisone taper. Avoid ibuprofen. Chlorzoxazone when necessary for spasm hydrocodone. For pain local measures discussed WSL

## 2015-06-01 ENCOUNTER — Ambulatory Visit (HOSPITAL_COMMUNITY)
Admission: RE | Admit: 2015-06-01 | Discharge: 2015-06-01 | Disposition: A | Discharge status: Home / Self Care | Payer: BLUE CROSS/BLUE SHIELD | Source: Ambulatory Visit | Attending: Nurse Practitioner | Admitting: Nurse Practitioner

## 2015-06-01 DIAGNOSIS — R928 Other abnormal and inconclusive findings on diagnostic imaging of breast: Secondary | ICD-10-CM | POA: Insufficient documentation

## 2015-06-03 ENCOUNTER — Other Ambulatory Visit: Payer: Self-pay | Admitting: "Endocrinology

## 2015-07-07 ENCOUNTER — Other Ambulatory Visit: Payer: Self-pay | Admitting: "Endocrinology

## 2015-07-14 ENCOUNTER — Encounter: Payer: Self-pay | Admitting: "Endocrinology

## 2015-07-14 ENCOUNTER — Ambulatory Visit (INDEPENDENT_AMBULATORY_CARE_PROVIDER_SITE_OTHER): Payer: BLUE CROSS/BLUE SHIELD | Admitting: "Endocrinology

## 2015-07-14 VITALS — BP 121/75 | HR 105 | Ht 61.5 in | Wt 162.0 lb

## 2015-07-14 DIAGNOSIS — Z9119 Patient's noncompliance with other medical treatment and regimen: Secondary | ICD-10-CM

## 2015-07-14 DIAGNOSIS — E785 Hyperlipidemia, unspecified: Secondary | ICD-10-CM

## 2015-07-14 DIAGNOSIS — Z794 Long term (current) use of insulin: Secondary | ICD-10-CM | POA: Diagnosis not present

## 2015-07-14 DIAGNOSIS — IMO0002 Reserved for concepts with insufficient information to code with codable children: Secondary | ICD-10-CM

## 2015-07-14 DIAGNOSIS — E118 Type 2 diabetes mellitus with unspecified complications: Secondary | ICD-10-CM

## 2015-07-14 DIAGNOSIS — I10 Essential (primary) hypertension: Secondary | ICD-10-CM

## 2015-07-14 DIAGNOSIS — E1165 Type 2 diabetes mellitus with hyperglycemia: Secondary | ICD-10-CM

## 2015-07-14 DIAGNOSIS — Z91199 Patient's noncompliance with other medical treatment and regimen due to unspecified reason: Secondary | ICD-10-CM

## 2015-07-14 NOTE — Progress Notes (Signed)
Subjective:    Patient ID: Cheryl Middleton, female    DOB: Jul 28, 1965, PCP Sallee Lange, MD   Past Medical History  Diagnosis Date  . Hyperlipidemia   . Diabetes mellitus, type II ([place])   . Malignant neoplasm of tongue [facility])    Past Surgical History  Procedure Laterality Date  . Breast lumpectomy    . Tracheostomy     Social History   Social History  . Marital Status: Legally Separated    Spouse Name: N/A  . Number of Children: N/A  . Years of Education: N/A   Social History Main Topics  . Smoking status: Never Smoker   . Smokeless tobacco: Never Used  . Alcohol Use: No  . Drug Use: No  . Sexual Activity: Not Currently    Birth Control/ Protection: None   Other Topics Concern  . None   Social History Narrative   Outpatient Encounter Prescriptions as of 07/14/2015  Medication Sig  . BAYER CONTOUR TEST test strip TEST 4 TIMES DAILY  . chlorzoxazone (PARAFON) 500 MG tablet Take 1 tablet (500 mg total) by mouth 3 (three) times daily as needed for muscle spasms.  Marland Kitchen HYDROcodone-acetaminophen (NORCO/VICODIN) 5-325 MG tablet Take 1 tablet by mouth every 6 (six) hours as needed.  . insulin NPH-regular Human (NOVOLIN 70/30) (70-30) 100 UNIT/ML injection Inject 20 Units into the skin 2 (two) times daily with a meal.  . INVOKANA 100 MG TABS tablet TAKE 1 TABLET BY MOUTH EVERY DAY  . metFORMIN (GLUCOPHAGE-XR) 750 MG 24 hr tablet TAKE 1 TABLET BY MOUTH EVERY DAY  . pravastatin (PRAVACHOL) 20 MG tablet TAKE 1 TABLET BY MOUTH AT BEDTIME  . predniSONE (DELTASONE) 10 MG tablet Take 4 tabs for 3 days, then 3 tabs for 3 days, then 2 tabs for 2 days   No facility-administered encounter medications on file as of 07/14/2015.   ALLERGIES: Allergies  Allergen Reactions  . Ciprofloxacin   . Penicillins   . Sulfonamide Derivatives   . Ibuprofen Rash   VACCINATION STATUS: Immunization History  Administered Date(s) Administered  . Td 07/31/2008    Diabetes She presents for  her follow-up diabetic visit. She has type 2 diabetes mellitus. Onset time: She was diagnosed at approximate age of 57 years. Her disease course has been improving. There are no hypoglycemic associated symptoms. Pertinent negatives for hypoglycemia include no confusion, headaches, pallor or seizures. Associated symptoms include fatigue and polydipsia. Pertinent negatives for diabetes include no chest pain, no polyphagia and no polyuria. There are no hypoglycemic complications. Symptoms are improving. There are no diabetic complications. Risk factors for coronary artery disease include diabetes mellitus, dyslipidemia, hypertension and sedentary lifestyle. Current diabetic treatment includes oral agent (monotherapy). She is compliant with treatment some of the time. Her weight is increasing steadily. She is following a generally unhealthy diet. When asked about meal planning, she reported none. She has not had a previous visit with a dietitian. She never participates in exercise. Home blood sugar record trend: She has not been consistent using her insulin with breakfast because she does not eat breakfast sometimes lunch is also late. Her fasting blood glucose profile is tightly controlled. Her breakfast blood glucose range is generally 90-110 mg/dl. Her lunch blood glucose range is generally 180-200 mg/dl. Her highest blood glucose is 180-200 mg/dl. Her overall blood glucose range is 140-180 mg/dl.  Hyperlipidemia This is a chronic problem. The current episode started more than 1 year ago. Pertinent negatives include no chest pain,  myalgias or shortness of breath. Current antihyperlipidemic treatment includes statins. Risk factors for coronary artery disease include dyslipidemia, diabetes mellitus, hypertension and a sedentary lifestyle.  Hypertension This is a chronic problem. The current episode started more than 1 year ago. Pertinent negatives include no chest pain, headaches, palpitations or shortness of  breath. Risk factors for coronary artery disease include diabetes mellitus, dyslipidemia and sedentary lifestyle. Compliance problems include diet, exercise, medication cost and psychosocial issues.      Review of Systems  Constitutional: Positive for fatigue. Negative for fever, chills and unexpected weight change.  HENT: Negative for trouble swallowing and voice change.   Eyes: Negative for visual disturbance.  Respiratory: Negative for cough, shortness of breath and wheezing.   Cardiovascular: Negative for chest pain, palpitations and leg swelling.  Gastrointestinal: Negative for nausea, vomiting and diarrhea.  Endocrine: Positive for polydipsia. Negative for cold intolerance, heat intolerance, polyphagia and polyuria.  Musculoskeletal: Negative for myalgias and arthralgias.  Skin: Negative for color change, pallor, rash and wound.  Neurological: Negative for seizures and headaches.  Psychiatric/Behavioral: Negative for suicidal ideas and confusion.    Objective:    BP 121/75 mmHg  Pulse 105  Ht 5' 1.5" (1.562 m)  Wt 162 lb (73.483 kg)  BMI 30.12 kg/m2  SpO2 98%  Wt Readings from Last 3 Encounters:  07/14/15 162 lb (73.483 kg)  05/25/15 169 lb (76.658 kg)  05/17/15 162 lb 9.6 oz (73.755 kg)    Physical Exam  Constitutional: She is oriented to person, place, and time. She appears well-developed.  HENT:  Head: Normocephalic and atraumatic.  Eyes: EOM are normal.  Neck: Normal range of motion. Neck supple. No tracheal deviation present. No thyromegaly present.  Cardiovascular: Normal rate and regular rhythm.   Pulmonary/Chest: Effort normal and breath sounds normal.  Abdominal: Soft. Bowel sounds are normal. There is no tenderness. There is no guarding.  Musculoskeletal: Normal range of motion. She exhibits no edema.  Neurological: She is alert and oriented to person, place, and time. She has normal reflexes. No cranial nerve deficit. Coordination normal.  Skin: Skin is warm  and dry. No rash noted. No erythema. No pallor.  Psychiatric: She has a normal mood and affect. Judgment normal.    CMP     Component Value Date/Time   NA 143 07/31/2008 0000   K 4.1 07/31/2008 0000   CL 104 07/31/2008 0000   CO2 31 07/31/2008 0000   GLUCOSE 105* 07/31/2008 0000   BUN 9 07/31/2008 0000   CREATININE 0.6 07/31/2008 0000   CALCIUM 9.0 07/31/2008 0000   PROT 7.8 07/31/2008 0000   ALBUMIN 3.9 07/31/2008 0000   AST 17 07/31/2008 0000   ALT 11 07/31/2008 0000   ALKPHOS 87 07/31/2008 0000   BILITOT 0.5 07/31/2008 0000   GFRNONAA 141.28 07/31/2008 0000   GFRAA 142 01/16/2007 0734     Diabetic Labs (most recent): Lab Results  Component Value Date   HGBA1C 11.2 03/04/2015   HGBA1C 11.3* 07/31/2008   HGBA1C 9.5* 01/16/2007    Lipid Panel     Component Value Date/Time   CHOL 222* 07/31/2008 0000   TRIG 82.0 07/31/2008 0000   HDL 47.10 07/31/2008 0000   CHOLHDL 5 07/31/2008 0000   VLDL 16.4 07/31/2008 0000   LDLDIRECT 159.9 07/31/2008 0000     Assessment & Plan:   1. Uncontrolled type 2 diabetes mellitus with complication, with long-term current use of insulin ([place])  - patient remains at a high risk for more  acute and chronic complications of diabetes which include CAD, CVA, CKD, retinopathy, and neuropathy. These are all discussed in detail with the patient.  Patient came with out  Her logs and her meter shows only 1 test a day, despite my advice for her to test 4 times a day. Her recent A1c  Slightly improved to 10.3% from 11.2 %.  She remains non compliant, as far as her meal timing is concerned.  Recent labs reviewed.   - I have re-counseled the patient on diet management   by adopting a carbohydrate restricted / protein rich  Diet.  - Suggestion is made for patient to avoid simple carbohydrates   from their diet including Cakes , Desserts, Ice Cream,  Soda (  diet and regular) , Sweet Tea , Candies,  Chips, Cookies, Artificial Sweeteners,   and  "Sugar-free" Products .  This will help patient to have stable blood glucose profile and potentially avoid unintended  Weight gain.  - Patient is advised to stick to a routine mealtimes to eat 3 meals  a day and avoid unnecessary snacks ( to snack only to correct hypoglycemia).  - The patient  will be  scheduled with Jearld Fenton, RDN, CDE for individualized DM education.  - I have approached patient with the following individualized plan to manage diabetes and patient agrees.  - Based on her A1c, she would need basal bolus insulin, however she hesitates to engage. However she is willing to use Novolin 70/30 properly going forward. - I urged her to  resume Novolin 70/30 20 units with breakfast and  lower to 15 units with supper for pre-meal glycemia above 90 mg/dL.  -She is advised to resume strict strict monitoring of glucose  AC  breakfast and supper, at least 2 times a day for safe use of insulin. - Patient is warned not to take insulin without proper monitoring per orders. -Adjustment parameters are given for hypo and hyperglycemia in writing. -Patient is encouraged to call clinic for blood glucose levels less than 70 or above 300 mg /dl. - I will continue Invokana 100 mg once a day and Glucophage 750 mg once a day, therapeutically suitable for patient.  - Patient specific target  for A1c; LDL, HDL, Triglycerides, and  Waist Circumference were discussed in detail.  2) BP/HTN: Controlled. Continue current medications. 3) Lipids/HPL:  continue statins. 4)  Weight/Diet: CDE consult  will be initiated , exercise, and carbohydrates information provided.  5) Chronic Care/Health Maintenance:  -Patient is on Statin medications and encouraged to continue to follow up with Ophthalmology, Podiatrist at least yearly or according to recommendations, and advised to  stay away from smoking. I have recommended yearly flu vaccine and pneumonia vaccination at least every 5 years; moderate intensity  exercise for up to 150 minutes weekly; and  sleep for at least 7 hours a day.  - 25 minutes of time was spent on the care of this patient , 50% of which was applied for counseling on diabetes complications and their preventions.  - I advised patient to maintain close follow up with Sallee Lange, MD for primary care needs.  Patient is asked to bring meter and  blood glucose logs during their next visit.   Follow up plan: -Return in about 3 months (around 10/13/2015) for diabetes, high blood pressure, high cholesterol, follow up with pre-visit labs, meter, and logs.  Glade Lloyd, MD Phone: [phone]  Fax: [phone]   07/14/2015, 3:50 PM

## 2015-07-14 NOTE — Patient Instructions (Signed)

## 2015-08-12 ENCOUNTER — Encounter: Payer: Self-pay | Admitting: "Endocrinology

## 2015-10-14 ENCOUNTER — Ambulatory Visit: Payer: BLUE CROSS/BLUE SHIELD | Admitting: "Endocrinology

## 2016-06-01 ENCOUNTER — Other Ambulatory Visit: Payer: Self-pay | Admitting: Nurse Practitioner

## 2016-06-01 DIAGNOSIS — Z1231 Encounter for screening mammogram for malignant neoplasm of breast: Secondary | ICD-10-CM

## 2016-06-09 ENCOUNTER — Telehealth: Payer: Self-pay | Admitting: Family Medicine

## 2016-06-09 NOTE — Telephone Encounter (Signed)
Review lab results faxed from [facility] in results folder.

## 2016-06-09 NOTE — Telephone Encounter (Signed)
I reviewed over her lab work. This patient has significant diabetes and cholesterol issues. She definitely needs to be seen. She has a physical on Monday if we are covering these issues as well and regards to management prescriptions and there will be an additional management charge in addition to a wellness exam. A copy of her lab work is being forwarded to [place] who will be seeing her on Monday

## 2016-06-12 ENCOUNTER — Ambulatory Visit (INDEPENDENT_AMBULATORY_CARE_PROVIDER_SITE_OTHER): Payer: BLUE CROSS/BLUE SHIELD | Admitting: Nurse Practitioner

## 2016-06-12 ENCOUNTER — Encounter: Payer: Self-pay | Admitting: Nurse Practitioner

## 2016-06-12 VITALS — BP 142/92 | Ht 61.5 in | Wt 162.0 lb

## 2016-06-12 DIAGNOSIS — IMO0002 Reserved for concepts with insufficient information to code with codable children: Secondary | ICD-10-CM

## 2016-06-12 DIAGNOSIS — E782 Mixed hyperlipidemia: Secondary | ICD-10-CM | POA: Diagnosis not present

## 2016-06-12 DIAGNOSIS — E559 Vitamin D deficiency, unspecified: Secondary | ICD-10-CM

## 2016-06-12 DIAGNOSIS — Z794 Long term (current) use of insulin: Secondary | ICD-10-CM | POA: Diagnosis not present

## 2016-06-12 DIAGNOSIS — E1165 Type 2 diabetes mellitus with hyperglycemia: Secondary | ICD-10-CM

## 2016-06-12 DIAGNOSIS — E118 Type 2 diabetes mellitus with unspecified complications: Secondary | ICD-10-CM

## 2016-06-12 MED ORDER — ROSUVASTATIN CALCIUM 20 MG PO TABS: 20.0000 mg | ORAL_TABLET | Freq: Every day | ORAL | 2 refills | Status: DC

## 2016-06-12 MED ORDER — VITAMIN D (ERGOCALCIFEROL) 1.25 MG (50000 UNIT) PO CAPS: 50000.0000 [IU] | ORAL_CAPSULE | ORAL | 2 refills | Status: DC

## 2016-06-12 MED ORDER — INSULIN GLARGINE 300 UNIT/ML ~~LOC~~ SOPN: 40.0000 [IU] | PEN_INJECTOR | Freq: Every day | SUBCUTANEOUS | 2 refills | Status: DC

## 2016-06-12 NOTE — Telephone Encounter (Signed)
Noted  

## 2016-06-12 NOTE — Progress Notes (Signed)
Subjective:  51 yo female presented today for a wellness exam, but visit changed to focus on Diabetes.  Pre-visit blood work was drawn for patient showing a significantly elevated fasting blood glucose and HgA1C.  Was seeing an endocrinologist, but is struggling financially between specialist visits and increased costs of diabetic medications.  Has previously tried victoza with good success, but was switched to invokana.  Was uncomfortable with reports of significant side effects so stopped.  Continues to take metformin with good compliance and tolerance.  Has started a diabetic education class focusing on improving diet and exercise. Would like to have diabetic care and meds managed through this office.       Objective:   BP (!) 142/92   Ht 5' 1.5" (1.562 m)   Wt 162 lb (73.5 kg)   BMI 30.11 kg/m  Alert and oriented, NAD.  Chest: Lungs CTA Cardiac:  RRR, no murmurs   Labs 06/05/16  BG 330 K 5.5 Triglycerides 283 LDL 195 HDL 60  Vit D 14.4 HgA1C 15.5   Diabetic Foot Exam - Simple   Simple Foot Form Visual Inspection No deformities, no ulcerations, no other skin breakdown bilaterally:  Yes See comments:  Yes Sensation Testing Intact to touch and monofilament testing bilaterally:  Yes Pulse Check Posterior Tibialis and Dorsalis pulse intact bilaterally:  Yes Comments Dry and flaky skin to Left foot, no ulcerations noted     Assessment:   Problem List Items Addressed This Visit      Endocrine   Uncontrolled type 2 diabetes mellitus with complication, with long-term current use of insulin ([facility]) - Primary   Relevant Medications   Insulin Glargine (TOUJEO SOLOSTAR) 300 UNIT/ML SOPN   rosuvastatin (CRESTOR) 20 MG tablet     Other   Hyperlipidemia   Relevant Medications   rosuvastatin (CRESTOR) 20 MG tablet   Vitamin D deficiency       Plan:   Meds ordered this encounter  Medications  . Insulin Glargine (TOUJEO SOLOSTAR) 300 UNIT/ML SOPN    Sig: Inject 40 Units into the  skin at bedtime.    Dispense:  5 pen    Refill:  2    Please dispense thirty day supply    Order Specific Question:   Supervising Provider    Answer:   Mikey Kirschner [[id number]]  . Vitamin D, Ergocalciferol, (DRISDOL) 50000 units CAPS capsule    Sig: Take 1 capsule (50,000 Units total) by mouth every 7 (seven) days.    Dispense:  4 capsule    Refill:  2    Order Specific Question:   Supervising Provider    Answer:   Mikey Kirschner [[id number]]  . rosuvastatin (CRESTOR) 20 MG tablet    Sig: Take 1 tablet (20 mg total) by mouth daily.    Dispense:  30 tablet    Refill:  2    Order Specific Question:   Supervising Provider    Answer:   Mikey Kirschner [[id number]]   Has taken prescription Vitamin D in the past.  Will continue three month prescription dose, then continue on OTC Vitamin D.  Reviewed s/s of vitamin D deficiency.    Discussed in detail the extreme importance of tighter control of diabetes and hyperlipidemia related to high risk for cardiovascular event or Diabetic ketoacidosis.  Initiate Crestor 20mg  daily at this time for hyperlipidemia.  Patient agreed with this plan.    Continue Metformin.  Patient to start Toujeo with a savings card for assistance.  Reviewed warning signs for hyper- and hypoglycemia.  Pt. Familiar with how to inject insulin. Instructed to start with 20 units of toujeo and increase by 5 units every 3 days, with a fasting blood sugar goal of less than 130.  Recommended to monitor daily blood sugars in a log, bring log to next visit, and to communicate through [place] for any questions or concerns.    Due to insurances elevated cost of all injectable insulins, it took a considerable amount of time and consultation to find an affordable and safe solution for the patient.  Over 50% of this visit was spent in discussion and consultation Return in about 2 months (around 08/12/2016) for for physical.

## 2016-06-12 NOTE — Patient Instructions (Addendum)
Take the full course of prescription strength Vitamin D, then continue over the counter

## 2016-06-14 ENCOUNTER — Ambulatory Visit (HOSPITAL_COMMUNITY): Payer: BLUE CROSS/BLUE SHIELD

## 2016-06-14 ENCOUNTER — Encounter: Payer: Self-pay | Admitting: Nurse Practitioner

## 2016-06-19 ENCOUNTER — Ambulatory Visit (HOSPITAL_COMMUNITY)
Admission: RE | Admit: 2016-06-19 | Discharge: 2016-06-19 | Disposition: A | Discharge status: Home / Self Care | Payer: BLUE CROSS/BLUE SHIELD | Source: Ambulatory Visit | Attending: Nurse Practitioner | Admitting: Nurse Practitioner

## 2016-06-19 DIAGNOSIS — Z1231 Encounter for screening mammogram for malignant neoplasm of breast: Secondary | ICD-10-CM | POA: Insufficient documentation

## 2016-06-21 ENCOUNTER — Other Ambulatory Visit: Payer: Self-pay | Admitting: Nurse Practitioner

## 2016-06-22 NOTE — Telephone Encounter (Signed)
Left message return call 06/22/16

## 2016-06-23 MED ORDER — CLOTRIMAZOLE-BETAMETHASONE 1-0.05 % EX CREA: 1.0000 "application " | TOPICAL_CREAM | Freq: Two times a day (BID) | CUTANEOUS | 0 refills | Status: DC

## 2016-06-23 NOTE — Addendum Note (Signed)
Addended by: Nilda Simmer on: 06/23/2016 05:57 PM   Modules accepted: Orders

## 2016-06-23 NOTE — Telephone Encounter (Signed)
Med sent in.

## 2016-07-20 ENCOUNTER — Other Ambulatory Visit: Payer: Self-pay | Admitting: *Deleted

## 2016-07-20 MED ORDER — ROSUVASTATIN CALCIUM 20 MG PO TABS: 20.0000 mg | ORAL_TABLET | Freq: Every day | ORAL | 0 refills | Status: DC

## 2016-08-15 ENCOUNTER — Encounter: Payer: BLUE CROSS/BLUE SHIELD | Admitting: Nurse Practitioner

## 2016-08-17 ENCOUNTER — Other Ambulatory Visit: Payer: Self-pay | Admitting: Nurse Practitioner

## 2016-08-22 ENCOUNTER — Ambulatory Visit (INDEPENDENT_AMBULATORY_CARE_PROVIDER_SITE_OTHER): Payer: BLUE CROSS/BLUE SHIELD | Admitting: Nurse Practitioner

## 2016-08-22 ENCOUNTER — Encounter: Payer: Self-pay | Admitting: Nurse Practitioner

## 2016-08-22 VITALS — BP 138/80 | Ht 62.0 in | Wt 162.0 lb

## 2016-08-22 DIAGNOSIS — Z124 Encounter for screening for malignant neoplasm of cervix: Secondary | ICD-10-CM | POA: Diagnosis not present

## 2016-08-22 DIAGNOSIS — Z1151 Encounter for screening for human papillomavirus (HPV): Secondary | ICD-10-CM

## 2016-08-22 DIAGNOSIS — Z0000 Encounter for general adult medical examination without abnormal findings: Secondary | ICD-10-CM

## 2016-08-24 ENCOUNTER — Encounter: Payer: Self-pay | Admitting: Nurse Practitioner

## 2016-08-24 LAB — PAP IG AND HPV HIGH-RISK
HPV, HIGH-RISK: NEGATIVE
PAP Smear Comment: 0

## 2016-08-24 NOTE — Progress Notes (Addendum)
   Subjective:    Patient ID: Cheryl Middleton, female    DOB: Nov 07, 1965, 51 y.o.   MRN: [id number]  HPI presents for her wellness exam. Having a menses about every 2-3 months. No new sexual partners. Mildly depressed related to the loss of her mother in April. Active; tries to get in 3500-6000 steps daily. Overall healthy diet. Regular vision and dental exams.     Review of Systems  Constitutional: Positive for fatigue. Negative for activity change and appetite change.  HENT: Negative for dental problem, ear pain, sinus pressure and sore throat.   Respiratory: Negative for cough, chest tightness, shortness of breath and wheezing.   Cardiovascular: Negative for chest pain.  Gastrointestinal: Negative for abdominal distention, abdominal pain, blood in stool, constipation, diarrhea, nausea and vomiting.  Genitourinary: Positive for menstrual problem. Negative for difficulty urinating, dysuria, enuresis, frequency, genital sores, pelvic pain, urgency and vaginal discharge.       Objective:   Physical Exam  Constitutional: She is oriented to person, place, and time. She appears well-developed. No distress.  HENT:  Right Ear: External ear normal.  Left Ear: External ear normal.  Mouth/Throat: Oropharynx is clear and moist.  Neck: Normal range of motion. Neck supple. No tracheal deviation present. No thyromegaly present.  Cardiovascular: Normal rate, regular rhythm and normal heart sounds.  Exam reveals no gallop.   No murmur heard. Pulmonary/Chest: Effort normal and breath sounds normal.  Abdominal: Soft. She exhibits no distension. There is no tenderness.  Genitourinary: Vagina normal and uterus normal. No vaginal discharge found.  Genitourinary Comments: External GU: no rashes or lesions. Vagina: no discharge. No CMT. Bimanual exam: no tenderness or obvious masses.   Musculoskeletal: She exhibits no edema.  Lymphadenopathy:    She has no cervical adenopathy.  Neurological: She is  alert and oriented to person, place, and time.  Skin: Skin is warm and dry. No rash noted.  Psychiatric: She has a normal mood and affect. Her behavior is normal.  Vitals reviewed. Breast exam: no masses; axillae no adenopathy.         Assessment & Plan:  Routine general medical examination at a health care facility - Plan: Pap IG and HPV (high risk) DNA detection  Screening for cervical cancer - Plan: Pap IG and HPV (high risk) DNA detection  Screening for HPV (human papillomavirus) - Plan: Pap IG and HPV (high risk) DNA detection  Recommend daily vitamin D supplement. Recommend repeat A1C and yearly microalbumin through occupational health at work at end of June. Patient wants to wait until this fall after turning 51 to discuss colonoscopy.  Return in about 3 months (around 11/22/2016) for diabetes.

## 2016-08-24 NOTE — Progress Notes (Signed)
Patient having blood work done thru employer.

## 2016-09-06 ENCOUNTER — Telehealth: Payer: Self-pay | Admitting: Family Medicine

## 2016-09-06 NOTE — Telephone Encounter (Signed)
Review results to lab results in yellow folder.

## 2016-09-07 ENCOUNTER — Other Ambulatory Visit: Payer: Self-pay | Admitting: Nurse Practitioner

## 2016-09-07 MED ORDER — LOSARTAN POTASSIUM 25 MG PO TABS: 25.0000 mg | ORAL_TABLET | Freq: Every day | ORAL | 1 refills | Status: DC

## 2016-09-07 NOTE — Telephone Encounter (Signed)
Sent in generic Losartan lowest dose. This is one of the BP pills they recommend to protect kidneys from diabetes. Also keeping her sugars under good control will help.

## 2016-09-07 NOTE — Telephone Encounter (Signed)
Reviewed labs. Her A1C is still too high. Is she taking insulin as prescribed? How are her sugars running now? Also her urine test shows spilling of protein in the urine from diabetes. We need to start a low dose pill to help protect her kidneys. This is standard diabetes treatment. Please go over med list and verify. Thanks.

## 2016-09-07 NOTE — Telephone Encounter (Signed)
Patient notified Hoyle Sauer sent in generic Losartan lowest dose. This is one of the BP pills they recommend to protect kidneys from diabetes. Also keeping her sugars under good control will help. Patient verbalized understanding.

## 2016-09-07 NOTE — Telephone Encounter (Signed)
Spoke with patient and patient stated that she is currently taking her insulin as prescribed. She stated that her blood sugars in the morning are ranging from mid 100's to the 200's depending on what she eats. She states that she is ok with starting a pill for to protect her kidney's. She would like to know what pill you will be prescribing.

## 2016-09-12 ENCOUNTER — Other Ambulatory Visit: Payer: Self-pay | Admitting: Nurse Practitioner

## 2016-10-13 LAB — HM DIABETES EYE EXAM

## 2016-11-12 ENCOUNTER — Other Ambulatory Visit: Payer: Self-pay | Admitting: Family Medicine

## 2016-11-22 ENCOUNTER — Ambulatory Visit: Payer: Self-pay | Admitting: Nurse Practitioner

## 2016-11-27 ENCOUNTER — Encounter: Payer: Self-pay | Admitting: Nurse Practitioner

## 2016-11-27 ENCOUNTER — Ambulatory Visit (INDEPENDENT_AMBULATORY_CARE_PROVIDER_SITE_OTHER): Payer: Commercial Managed Care - PPO | Admitting: Nurse Practitioner

## 2016-11-27 VITALS — BP 128/84 | Ht 62.0 in | Wt 169.4 lb

## 2016-11-27 DIAGNOSIS — I10 Essential (primary) hypertension: Secondary | ICD-10-CM | POA: Diagnosis not present

## 2016-11-27 DIAGNOSIS — E1165 Type 2 diabetes mellitus with hyperglycemia: Secondary | ICD-10-CM

## 2016-11-27 DIAGNOSIS — IMO0002 Reserved for concepts with insufficient information to code with codable children: Secondary | ICD-10-CM

## 2016-11-27 DIAGNOSIS — Z794 Long term (current) use of insulin: Secondary | ICD-10-CM | POA: Diagnosis not present

## 2016-11-27 DIAGNOSIS — E118 Type 2 diabetes mellitus with unspecified complications: Secondary | ICD-10-CM | POA: Diagnosis not present

## 2016-11-27 MED ORDER — INSULIN GLARGINE 300 UNIT/ML ~~LOC~~ SOPN: 50.0000 [IU] | PEN_INJECTOR | Freq: Every day | SUBCUTANEOUS | 2 refills | Status: DC

## 2016-11-27 NOTE — Patient Instructions (Signed)
Decrease to 46 units daily on insulin

## 2016-11-28 ENCOUNTER — Encounter: Payer: Self-pay | Admitting: Nurse Practitioner

## 2016-11-28 NOTE — Progress Notes (Signed)
Subjective:  Presents for recheck of diabetes and HTN. Has blood sugar record with her. When she was on Toujeo 40 units, am BS ran 76-338, averaging in the 200s. Had one evening sugar of 537. Patient increased her dose to 50 units. BS range from 72-163. Has experienced occasional symptoms of hypoglycemia. Had a biopsy of her tongue last week. Waiting results. No CP/ischemic type pain or SOB. Plans to get labs done through work and needs a list from our office.   Objective:   BP 128/84   Ht '5\' 2"'$  (1.575 m)   Wt 169 lb 6.4 oz (76.8 kg)   BMI 30.98 kg/m  NAD. Alert, oriented. Lungs clear. Heart RRR. Large healing areas with exudate noted on lateral aspect of the tongue bilaterally.   Assessment:   Problem List Items Addressed This Visit      Cardiovascular and Mediastinum   Essential hypertension, benign     Endocrine   Uncontrolled type 2 diabetes mellitus with complication, with long-term current use of insulin (HCC) - Primary   Relevant Medications   Insulin Glargine (TOUJEO SOLOSTAR) 300 UNIT/ML SOPN       Plan:   Meds ordered this encounter  Medications  . Insulin Glargine (TOUJEO SOLOSTAR) 300 UNIT/ML SOPN    Sig: Inject 50 Units into the skin at bedtime.    Dispense:  5 pen    Refill:  2    Order Specific Question:   Supervising Provider    Answer:   Mikey Kirschner [[id number]]   Decrease Toujeo to 46 units daily. Monitor BS and slowly increase if needed. Reviewed signs and symptoms of hypoglycemia with appropriate action. Will refer for routine colonoscopy after age 2. Given list of lab work for her job including lipid liver hemoglobin A1c met 7 and vitamin D level. To have results faxed to our office. Follow-up with her specialist regarding her tongue biopsy. Patient will consider starting sliding scale for her insulin but not at this time. Given prescription for Contour Elenor Legato to see if her insurance will cover this. Return in about 3 months (around 02/26/2017) for diabetes  check up. Discussed importance of regular activity and diabetic diet.

## 2016-12-08 ENCOUNTER — Telehealth: Payer: Self-pay | Admitting: Nurse Practitioner

## 2016-12-08 DIAGNOSIS — E875 Hyperkalemia: Secondary | ICD-10-CM

## 2016-12-08 NOTE — Telephone Encounter (Signed)
Review lab results in yellow folder from [facility].

## 2016-12-11 NOTE — Telephone Encounter (Signed)
Labs were reviewed and notes sent to nurses station.

## 2016-12-11 NOTE — Telephone Encounter (Signed)
Per Hoyle Sauer: Blood sugar is high as expected. Are her sugars improving? Review high potassium foods( mailed paper with high potassium foods)- she needs to avoid potassium supplements and high potassium foods-this can be dangerous if gets too high. Repeat potassium in 2-3 weeks. Also take OTC Vit D 1000 units per day (level a little low)

## 2016-12-12 NOTE — Telephone Encounter (Signed)
Patient would like a prescription for freestyle libra

## 2016-12-12 NOTE — Telephone Encounter (Signed)
Results discussed with patient. Patient advised Per Hoyle Sauer: Blood sugar is high as expected. Are her sugars improving? Review high potassium foods( mailed paper with high potassium foods)- she needs to avoid potassium supplements and high potassium foods-this can be dangerous if gets too high. Repeat potassium in 2-3 weeks. Also take OTC Vit D 1000 units per day (level a little low). Patient verbalized understanding and stated her sugars are slowly coming down.

## 2016-12-15 ENCOUNTER — Other Ambulatory Visit: Payer: Self-pay | Admitting: Nurse Practitioner

## 2016-12-15 NOTE — Telephone Encounter (Signed)
Rx written.

## 2016-12-15 NOTE — Telephone Encounter (Signed)
Prescription faxed to pharmacy.

## 2016-12-21 ENCOUNTER — Telehealth: Payer: Self-pay | Admitting: Family Medicine

## 2016-12-21 NOTE — Telephone Encounter (Signed)
Pt is needing a prior auth done on a freestyle libre. The number that was given to her by [facility] is [phone].  [facility]

## 2016-12-25 MED ORDER — GLUCOSE BLOOD VI STRP: ORAL_STRIP | 3 refills | Status: DC

## 2016-12-25 NOTE — Telephone Encounter (Signed)
Spoke with patient and informed her per her insurance the [employer] is not covered by insurance. Patient verbalized understanding.

## 2017-01-30 IMAGING — MG MM DIGITAL SCREENING
4 series · 4 of 4 positions shown · non-contrast
Comparison: Previous exam(s).

CLINICAL DATA: Screening.

EXAM:
DIGITAL SCREENING BILATERAL MAMMOGRAM WITH CAD

[L CC]
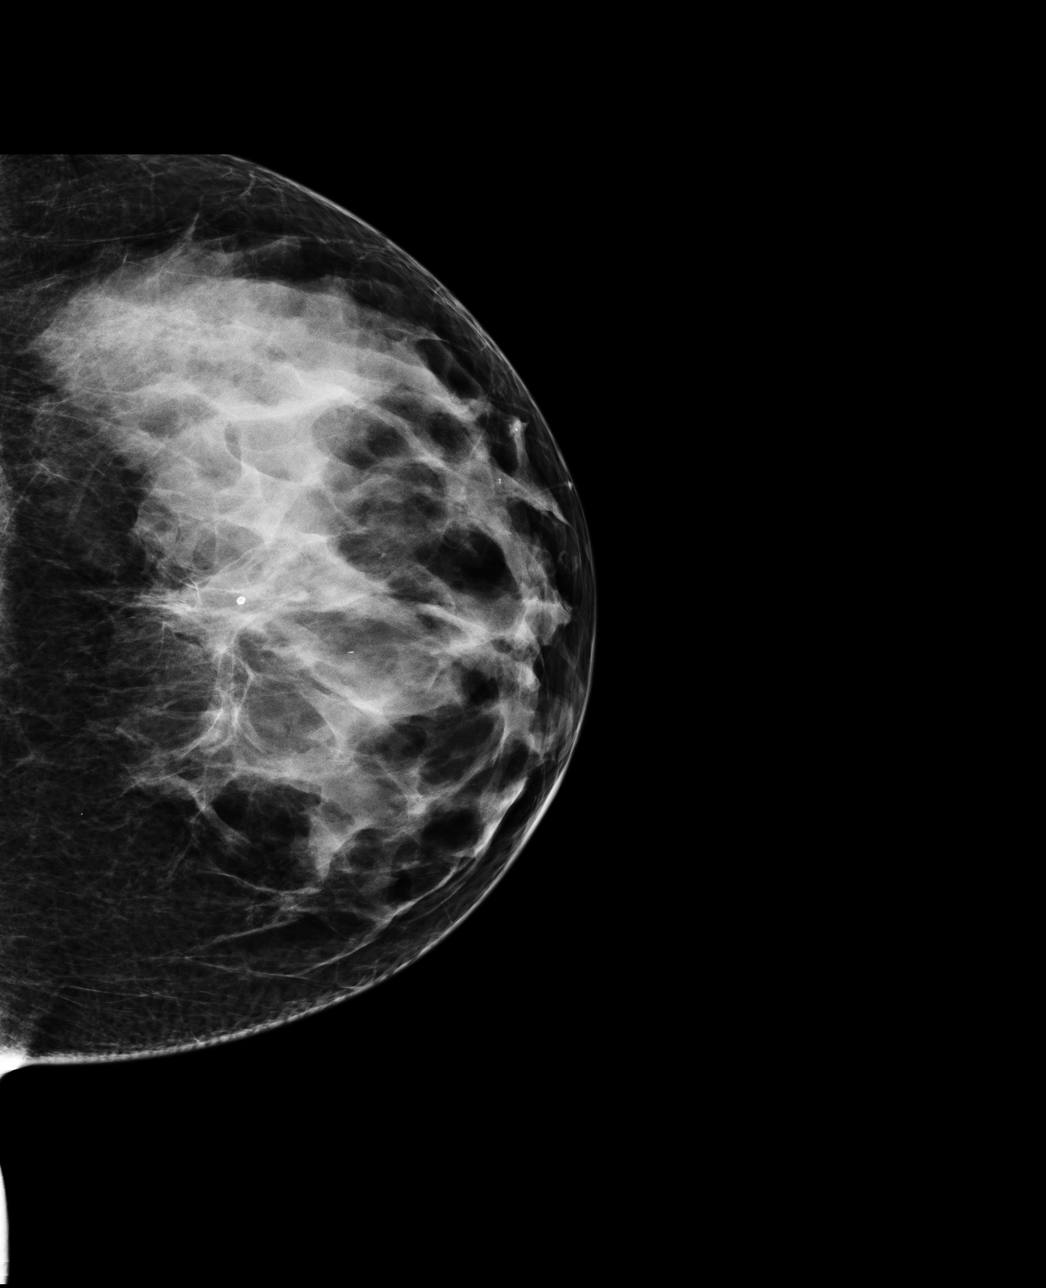

[L MLO]
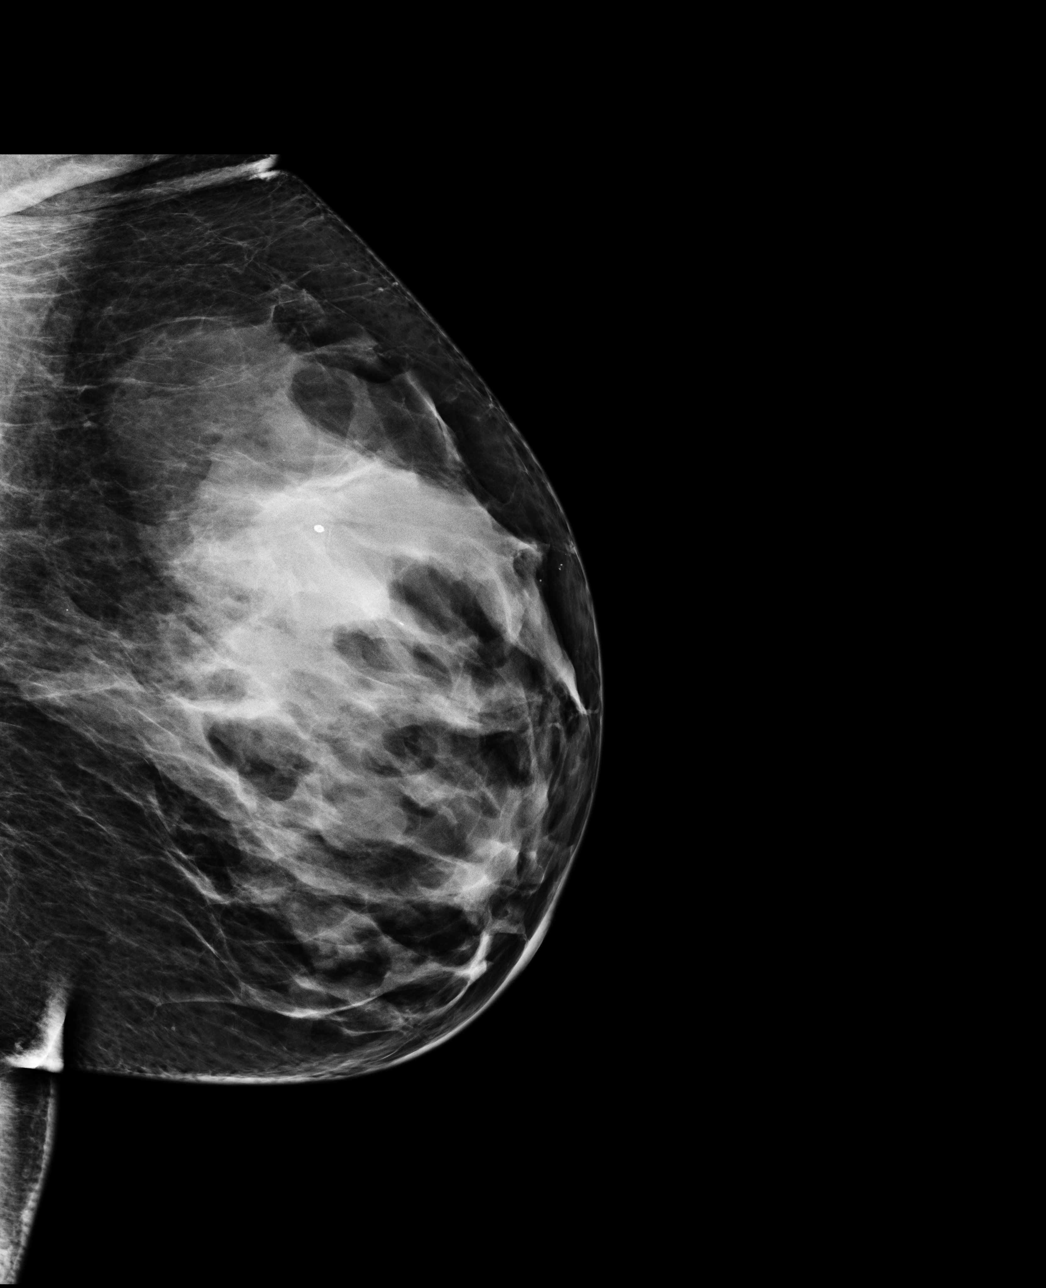

[R CC]
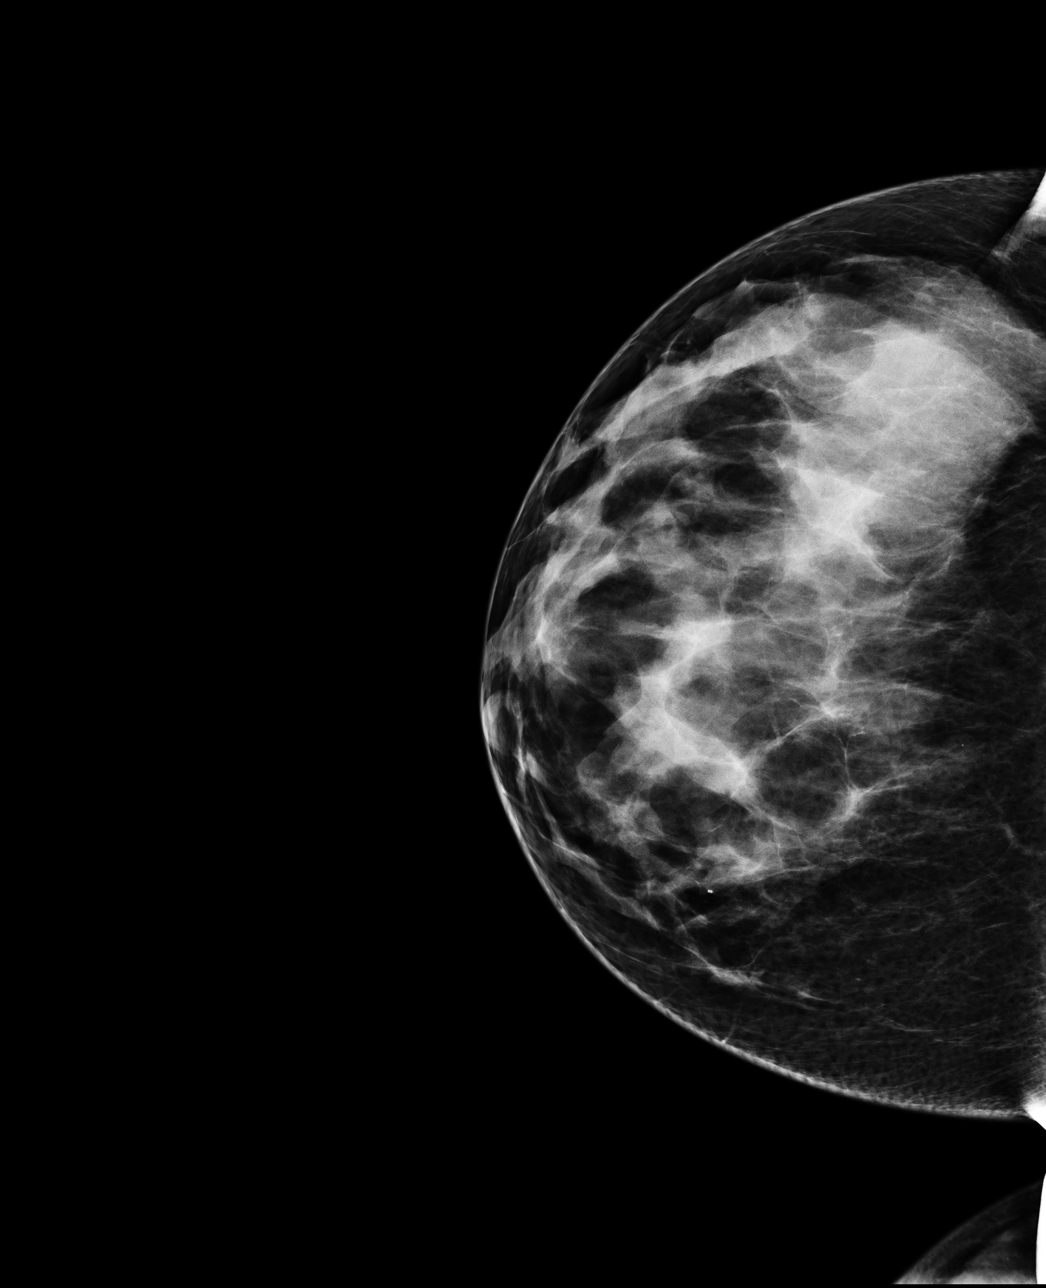

[R MLO]
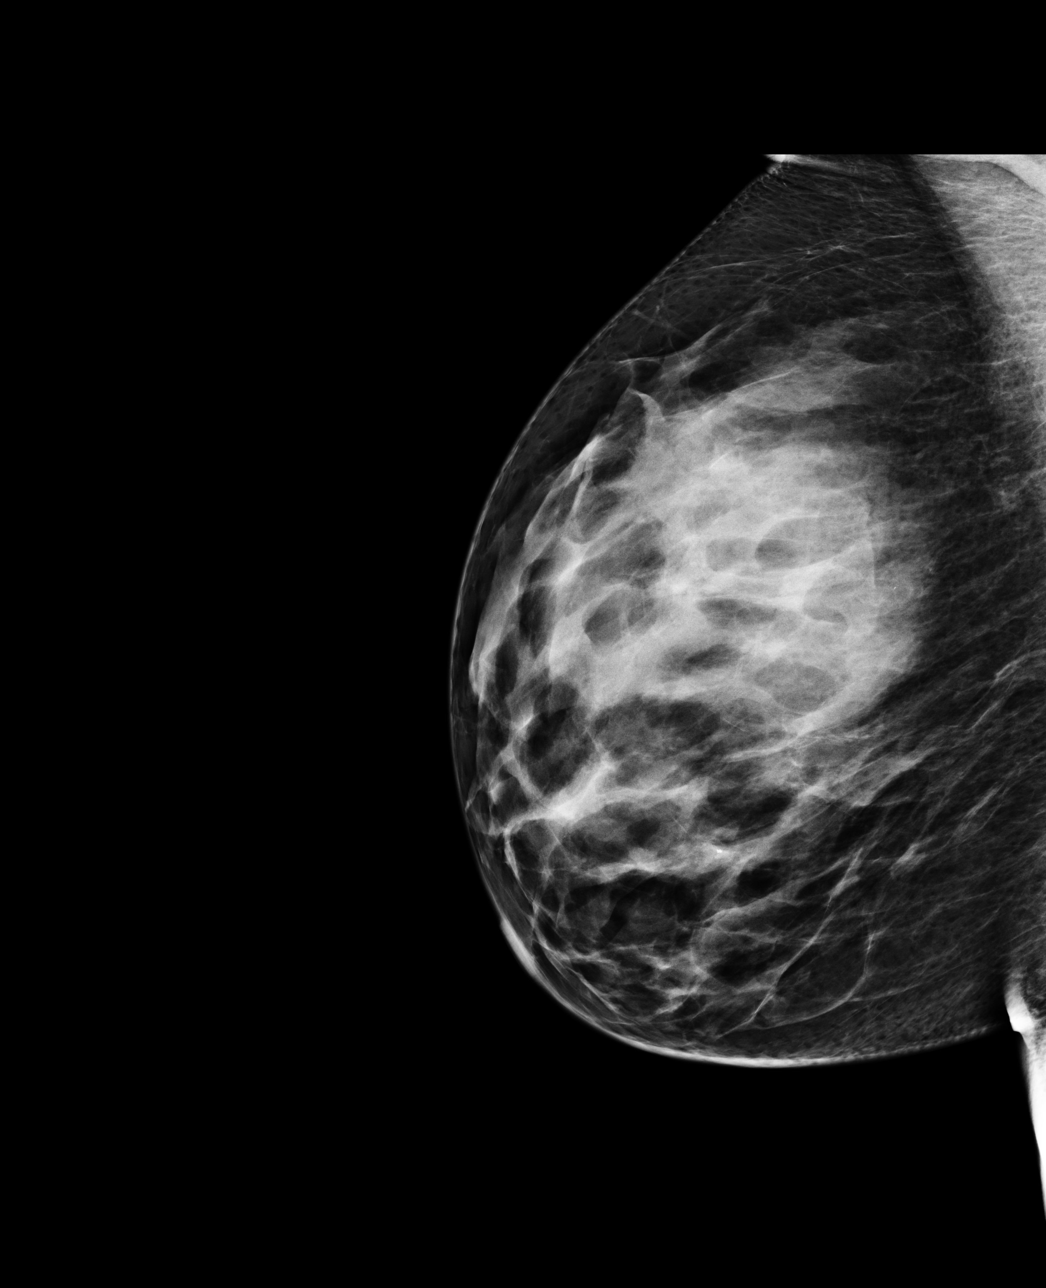

[4 of 4 positions shown; findings below may reference images not displayed]

ACR Breast Density Category d: The breast tissue is extremely dense,
which lowers the sensitivity of mammography.
FINDINGS: There are no findings suspicious for malignancy. Images were
processed with CAD.
IMPRESSION: No mammographic evidence of malignancy. A result letter of this
screening mammogram will be mailed directly to the patient.

RECOMMENDATION:
Screening mammogram in one year. (Code:[id number])

BI-RADS CATEGORY  1: Negative.

## 2017-02-28 ENCOUNTER — Encounter: Payer: Self-pay | Admitting: Nurse Practitioner

## 2017-02-28 ENCOUNTER — Ambulatory Visit (INDEPENDENT_AMBULATORY_CARE_PROVIDER_SITE_OTHER): Payer: Commercial Managed Care - PPO | Admitting: Nurse Practitioner

## 2017-02-28 VITALS — BP 132/84 | Ht 62.0 in | Wt 176.0 lb

## 2017-02-28 DIAGNOSIS — Z794 Long term (current) use of insulin: Secondary | ICD-10-CM | POA: Diagnosis not present

## 2017-02-28 DIAGNOSIS — Z1211 Encounter for screening for malignant neoplasm of colon: Secondary | ICD-10-CM

## 2017-02-28 DIAGNOSIS — E1165 Type 2 diabetes mellitus with hyperglycemia: Secondary | ICD-10-CM | POA: Diagnosis not present

## 2017-02-28 DIAGNOSIS — E118 Type 2 diabetes mellitus with unspecified complications: Secondary | ICD-10-CM | POA: Diagnosis not present

## 2017-02-28 DIAGNOSIS — IMO0002 Reserved for concepts with insufficient information to code with codable children: Secondary | ICD-10-CM

## 2017-02-28 NOTE — Patient Instructions (Addendum)
Find out what regular insulin is preferred in pen form  Increase insulin by 2 units every 3 days until fasting sugar is below 140 or you reach 80 units

## 2017-03-01 ENCOUNTER — Encounter: Payer: Self-pay | Admitting: Nurse Practitioner

## 2017-03-01 ENCOUNTER — Encounter: Payer: Self-pay | Admitting: Family Medicine

## 2017-03-01 NOTE — Progress Notes (Signed)
Subjective:  Presents for recheck on her diabetes. Has decreased her fast food intake and fixing more healthy recipes at home. Has gained some weight since going on [place] but just went on a cruise. Has gotten 3 fasting sugars under 200. The rest are 200-230 which is improved from fasting on last labs. No CP/ischemic type pain or SOB. No TIA symptomatology. Requesting referral for colon CA screening.   Objective:   BP 132/84   Ht 5\' 2"  (1.575 m)   Wt 176 lb (79.8 kg)   BMI 32.19 kg/m  NAD. Alert, oriented. Lungs clear. Heart RRR.   Assessment:   Problem List Items Addressed This Visit      Endocrine   Uncontrolled type 2 diabetes mellitus with complication, with long-term current use of insulin ([place]) - Primary    Other Visit Diagnoses    Screen for colon cancer       Relevant Orders   Ambulatory referral to Gastroenterology       Plan:  Given note for labs to be done through work since this is free including A1c. Also included [place] since she has not had a cycle in 4 months. Encouraged continued healthy diet and increase walking. Discussed again the potential health risks associated with uncontrolled diabetes.  Increase Toujeo insulin by 2 units every 3 days until fasting sugar is below 140 or she reaches 80 units. She will also check to see which regular insulin is covered in case we need to add sliding scale.  Return in about 3 months (around 05/29/2017) for diabetes check up. Contact office sooner if any problems.

## 2017-03-07 ENCOUNTER — Telehealth: Payer: Self-pay | Admitting: Nurse Practitioner

## 2017-03-07 ENCOUNTER — Encounter: Payer: Self-pay | Admitting: Nurse Practitioner

## 2017-03-07 ENCOUNTER — Other Ambulatory Visit: Payer: Self-pay | Admitting: Nurse Practitioner

## 2017-03-07 NOTE — Telephone Encounter (Signed)
Mychart message sent.

## 2017-03-07 NOTE — Telephone Encounter (Signed)
Review blood work results in Network engineer from [place].

## 2017-03-09 ENCOUNTER — Other Ambulatory Visit: Payer: Self-pay | Admitting: Nurse Practitioner

## 2017-03-09 ENCOUNTER — Telehealth: Payer: Self-pay | Admitting: Nurse Practitioner

## 2017-03-09 MED ORDER — INSULIN GLARGINE 300 UNIT/ML ~~LOC~~ SOPN: 60.0000 [IU] | PEN_INJECTOR | Freq: Every day | SUBCUTANEOUS | 2 refills | Status: DC

## 2017-03-09 NOTE — Telephone Encounter (Signed)
Noted. New dose sent in.

## 2017-03-09 NOTE — Telephone Encounter (Signed)
The dosage of toujeo that the pt takes is 60. She was told to call and let Hoyle Sauer know so that she can call in a prescription.     [facility]

## 2017-03-21 ENCOUNTER — Telehealth: Payer: Self-pay | Admitting: Nurse Practitioner

## 2017-03-21 NOTE — Telephone Encounter (Signed)
I called pt home #, a female answered and I asked that he have pt to r/c.

## 2017-03-21 NOTE — Telephone Encounter (Signed)
Nurses: this my chart message came back to me unread by the patient.  Cheryl Middleton, just reviewed your labs. Your [place] indicates you have gone through menopause so let me know if you have further vaginal bleeding. Vitamin D is good. Fasting sugar that day was good but A1C was very bad but we expected that. Continue increasing your Toujeo as we discussed. Hopefully your sugars will continue to improve. Recommend rechecking A1C in 3 months. You are definitely doing something right. Your fasting sugar on last labs was over 300 and this time 131! Good job! Cheryl Middleton

## 2017-03-21 NOTE — Telephone Encounter (Signed)
Patient is aware 

## 2017-03-27 ENCOUNTER — Ambulatory Visit (INDEPENDENT_AMBULATORY_CARE_PROVIDER_SITE_OTHER): Payer: Self-pay

## 2017-03-27 DIAGNOSIS — Z1211 Encounter for screening for malignant neoplasm of colon: Secondary | ICD-10-CM

## 2017-03-27 MED ORDER — PEG-KCL-NACL-NASULF-NA ASC-C 100 G PO SOLR: 1.0000 | ORAL | 0 refills | Status: DC

## 2017-03-27 NOTE — Patient Instructions (Signed)
SPLIT MOVIPREP INSTRUCTION SHEET  Please notify us immediately if you are diabetic, take iron supplements, or if you are on coumadin or any blood thinners.  Patient Name:  Cheryl Middleton Date of procedure:  04/20/17  Time to register at [facility]:  11:00 Provider:  Dr. Lynn Ito will need to purchase 1 fleet enema   Please hold the following medications: will call    04/19/17   1 Day prior to procedure:     CLEAR LIQUIDS ALL DAY--NO SOLID FOODS!  Diabetic Medication Instructions:  Will call   At 5:00 PM Begin the prep as follows:     Empty 1 pouch A and 1 pouch B into disposable container.  Add lukewarm drinking water to the top line of the container.  Mix to dissolve.  You can mix solution ahead of time & refrigerate prior to drinking.  The solution should be used within 24 hours.  The container is divided by 4 marks.  Every 15 minutes, drink the solution down to the next mark (approx 8 oz) until the liter is complete.  Be sure to drink 16 ounces of clear liquid of your choice (this is important to make sure you stay hydrated and the prep works)   [place]  ([place]). You may take heart, blood pressure, or breathing  medications  04/20/17  Day of Procedure    Diabetic medications adjustments for today:will call   Four hours before your procedure at 8:00am complete prep as follows:  Empty 1 pouch A and 1 pouch B into disposable container.  Add lukewarm drinking water to the top line of the container.  Mix to dissolve.  You can mix solution ahead of time & refrigerate prior to drinking.  The solution should be used within 24 hours.  The container is divided by 4 marks.  Every 15 minutes, drink the solution down to the next mark (approx 8 oz) until the liter is complete.  You must complete the entire prep to ensure the most effective cleaning.  Be sure to drink 16 ounces of clear liquid of your choice (this is  important to make sure you stay hydrated and the prep works)  Should complete within 1 and 1/2 hours.   NOTHING TO EAT OR DRINK AFTER 10:00AM (2 hours before your procedure)  Give yourself one Fleet enema about 1 hour prior to leaving for the hospital.  You may take TYLENOL products.  Please continue your regular medications unless we have instructed you otherwise.     Please note, on the day of your procedure you MUST be accompanied by an adult who is willing to assume responsibility for you at time of discharge. If you do not have such person with you, your procedure will have to be rescheduled.  Please leave ALL jewelry at home prior to coming to the hospital for your procedure.   *It is your responsibility to check with your insurance company for the benefits of coverage you have for this procedure. Unfortunately, not all insurance companies have benefits to cover all or part of these types of procedures. It is your responsibility to check your benefits, however we will be glad to assist you with any codes your insurance company may need.   Please note that most insurance companies will not cover a screening colonoscopy for people under the age of 53  For example, with some insurance companies you may have benefits for a screening colonoscopy, but if polyps are found the diagnosis will change and then you may have a deductible that will need to be met. Please make sure you check your benefits for screening colonoscopy as well as a diagnostic colonoscopy.   CLEAR LIQUIDS: (NO RED) Jello Apple Juice  White Grape Juice Water Banana popsicles  Kool-Aid  Coffee(No cream or milk) Tea (No cream or milk) Soft drinks Broth (fat free beef/chicken/vegetable)  Clear liquids allow you to see your fingers on the other side of the glass.  Be sure they are NOT RED in color, cloudy, but  CLEAR.  Do Not Eat: Dairy products of any kind Cranberry juice Tomato or V8 Juice  Orange Juice Grapefruit Juice  Red Grape Juice Solid foods like cereal, oatmeal, yogurt, fruits, vegetables, creamed soups, eggs, bread, etc   HELPFUL HINTS TO MAKE DRINKING EASIER: -Make sure prep is extremely COLD.  Refrigerate the night before.  You may also put in freezer. -You may try mixing Crystal Light or Country Time Lemonade if you prefer.  MIx in small amounts.  Add more if necessary. -Trying drinking through a straw. -Rinse mouth with water or mouthwash between glasses to remove aftertaste. -Try sipping on a cold beverage/ice popsicles between glasses of prep. -Place a piece of sugar-free hard candy in mouth between glasses. -If you become nauseated, try consuming smaller amounts or stretch out the time between glasses.  Stop for 30 minutes to an hour & slowly start back drinking.  Call our office with any questions or concerns at [phone].  Thank You

## 2017-03-27 NOTE — Progress Notes (Signed)
Gastroenterology Pre-Procedure Review   Cheryl Middleton- Adjust DM medication?    Request Date:03/27/17 Requesting Physician: Pearson Forster NP  PATIENT REVIEW QUESTIONS: The patient responded to the following health history questions as indicated:    1. Diabetes Melitis: yes (toujeo 60 units at bedtime) 2. Joint replacements in the past 12 months: no 3. Major health problems in the past 3 months: no 4. Has an artificial valve or MVP: no 5. Has a defibrillator: no 6. Has been advised in past to take antibiotics in advance of a procedure like teeth cleaning: no 7. Family history of colon cancer: no  8. Alcohol Use: no 9. History of sleep apnea: no  10. History of coronary artery or other vascular stents placed within the last 12 months: no 11. History of any prior anesthesia complications: no    MEDICATIONS & ALLERGIES:    Patient reports the following regarding taking any blood thinners:   Plavix? no Aspirin? no Coumadin? no Brilinta? no Xarelto? no Eliquis? no Pradaxa? no Savaysa? no Effient? no  Patient confirms/reports the following medications:  Current Outpatient Medications  Medication Sig Dispense Refill  . Cholecalciferol (VITAMIN D PO) Take by mouth. otc once daily    . clotrimazole-betamethasone (LOTRISONE) cream Apply 1 application topically 2 (two) times daily. Prn up to 2 weeks at a time 30 g 0  . Insulin Glargine (TOUJEO SOLOSTAR) 300 UNIT/ML SOPN Inject 60 Units into the skin at bedtime. 5 pen 2  . losartan (COZAAR) 25 MG tablet TAKE 1 TABLET BY MOUTH EVERY DAY 90 tablet 1  . rosuvastatin (CRESTOR) 20 MG tablet TAKE 1 TABLET BY MOUTH EVERY DAY 90 tablet 0  . glucose blood (BAYER CONTOUR TEST) test strip TEST 4 TIMES DAILY 150 each 3  . peg 3350 powder (MOVIPREP) 100 g SOLR Take 1 kit (200 g total) by mouth as directed. 1 kit 0   No current facility-administered medications for this visit.     Patient confirms/reports the following allergies:  Allergies  Allergen  Reactions  . Ciprofloxacin   . Penicillins   . Sulfonamide Derivatives   . Ibuprofen Rash    No orders of the defined types were placed in this encounter.   AUTHORIZATION INFORMATION Primary Insurance: [place],  [place] #: [id number]  Pre-Cert / Josem Kaufmann required:    SCHEDULE INFORMATION: Procedure has been scheduled as follows:  Date: 04/20/17, Time: 12:00  Location: [facility]  This Gastroenterology Pre-Precedure Review Form is being routed to the following provider(s): Roseanne Kaufman NP

## 2017-03-28 NOTE — Progress Notes (Signed)
Appropriate. Take 1/2 dose insulin the evening prior.

## 2017-03-28 NOTE — Progress Notes (Signed)
Pt is aware. I offered to mail her a letter with this information and she said she didn't need it

## 2017-04-19 NOTE — Progress Notes (Signed)
Spoke with Jenny Reichmann at [facility]- procedure has been approved. Approval number is [id number]. I have called and informed Jeneen Rinks at the pre-service center.

## 2017-04-20 ENCOUNTER — Encounter (HOSPITAL_COMMUNITY): Payer: Self-pay | Admitting: *Deleted

## 2017-04-20 ENCOUNTER — Ambulatory Visit (HOSPITAL_COMMUNITY)
Admission: RE | Admit: 2017-04-20 | Discharge: 2017-04-20 | Disposition: A | Discharge status: Home / Self Care | Payer: Commercial Managed Care - PPO | Source: Ambulatory Visit | Attending: Gastroenterology | Admitting: Gastroenterology

## 2017-04-20 ENCOUNTER — Encounter (HOSPITAL_COMMUNITY)
Admission: RE | Disposition: A | Discharge status: Home / Self Care | Payer: Self-pay | Source: Ambulatory Visit | Attending: Gastroenterology

## 2017-04-20 ENCOUNTER — Other Ambulatory Visit: Payer: Self-pay

## 2017-04-20 DIAGNOSIS — Z1212 Encounter for screening for malignant neoplasm of rectum: Secondary | ICD-10-CM

## 2017-04-20 DIAGNOSIS — Q438 Other specified congenital malformations of intestine: Secondary | ICD-10-CM | POA: Insufficient documentation

## 2017-04-20 DIAGNOSIS — Z8249 Family history of ischemic heart disease and other diseases of the circulatory system: Secondary | ICD-10-CM | POA: Diagnosis not present

## 2017-04-20 DIAGNOSIS — Z833 Family history of diabetes mellitus: Secondary | ICD-10-CM | POA: Diagnosis not present

## 2017-04-20 DIAGNOSIS — E785 Hyperlipidemia, unspecified: Secondary | ICD-10-CM | POA: Insufficient documentation

## 2017-04-20 DIAGNOSIS — Z880 Allergy status to penicillin: Secondary | ICD-10-CM | POA: Diagnosis not present

## 2017-04-20 DIAGNOSIS — Z794 Long term (current) use of insulin: Secondary | ICD-10-CM | POA: Insufficient documentation

## 2017-04-20 DIAGNOSIS — E119 Type 2 diabetes mellitus without complications: Secondary | ICD-10-CM | POA: Diagnosis not present

## 2017-04-20 DIAGNOSIS — K5730 Diverticulosis of large intestine without perforation or abscess without bleeding: Secondary | ICD-10-CM | POA: Insufficient documentation

## 2017-04-20 DIAGNOSIS — Z886 Allergy status to analgesic agent status: Secondary | ICD-10-CM | POA: Insufficient documentation

## 2017-04-20 DIAGNOSIS — Z1211 Encounter for screening for malignant neoplasm of colon: Secondary | ICD-10-CM | POA: Diagnosis present

## 2017-04-20 DIAGNOSIS — Z79899 Other long term (current) drug therapy: Secondary | ICD-10-CM | POA: Diagnosis not present

## 2017-04-20 DIAGNOSIS — Z882 Allergy status to sulfonamides status: Secondary | ICD-10-CM | POA: Diagnosis not present

## 2017-04-20 DIAGNOSIS — Z881 Allergy status to other antibiotic agents status: Secondary | ICD-10-CM | POA: Insufficient documentation

## 2017-04-20 DIAGNOSIS — D128 Benign neoplasm of rectum: Secondary | ICD-10-CM | POA: Insufficient documentation

## 2017-04-20 DIAGNOSIS — Z85810 Personal history of malignant neoplasm of tongue: Secondary | ICD-10-CM | POA: Insufficient documentation

## 2017-04-20 HISTORY — PX: COLONOSCOPY: SHX5424

## 2017-04-20 LAB — GLUCOSE, CAPILLARY: Glucose-Capillary: 99 mg/dL (ref 65–99)

## 2017-04-20 SURGERY — COLONOSCOPY
Anesthesia: Moderate Sedation

## 2017-04-20 MED ORDER — MIDAZOLAM HCL 5 MG/5ML IJ SOLN
INTRAMUSCULAR | Status: DC | PRN
  Administered 2017-04-20 (×2): 2 mg via INTRAVENOUS

## 2017-04-20 MED ORDER — STERILE WATER FOR IRRIGATION IR SOLN
Status: DC | PRN
  Administered 2017-04-20: 100 mL

## 2017-04-20 MED ORDER — SODIUM CHLORIDE 0.9 % IV SOLN
INTRAVENOUS | Status: DC
  Administered 2017-04-20: 11:00:00 via INTRAVENOUS

## 2017-04-20 MED ORDER — MEPERIDINE HCL 100 MG/ML IJ SOLN
INTRAMUSCULAR | Status: DC | PRN
  Administered 2017-04-20: 25 mg via INTRAVENOUS
  Administered 2017-04-20: 50 mg via INTRAVENOUS

## 2017-04-20 MED ORDER — MEPERIDINE HCL 100 MG/ML IJ SOLN
INTRAMUSCULAR | Status: AC
  Filled 2017-04-20: qty 2

## 2017-04-20 MED ORDER — MIDAZOLAM HCL 5 MG/5ML IJ SOLN
INTRAMUSCULAR | Status: AC
  Filled 2017-04-20: qty 10

## 2017-04-20 NOTE — Discharge Instructions (Signed)
You have small EXTERNAL hemorrhoids and diverticulosis IN YOUR RIGHT COLON. YOU HAD TWO SMALL POLYPS REMOVED.    DRINK WATER TO KEEP YOUR URINE LIGHT YELLOW.  CONTINUE YOUR WEIGHT LOSS EFFORTS. YOUR BODY MASS INDEX IS OVER 30 WHICH MEANS YOU ARE OBESE. OBESITY CAN ACTIVATE CANCER GENES. OBESITY IS ASSOCIATED WITH AN INCREASED FOR ALL CANCERS, INCLUDING ESOPHAGEAL AND COLON CANCER. A WEIGHT OF 160 LBS   WILL GET YOUR BODY MASS INDEX(BMI) UNDER 30.  FOLLOW A HIGH FIBER DIET. AVOID ITEMS THAT CAUSE BLOATING. See info below.  YOUR BIOPSY RESULTS WILL BE AVAILABLE IN MY CHART AFTER FEB 6 AND MY OFFICE WILL CONTACT YOU IN 10-14 DAYS WITH YOUR RESULTS.   USE PREPARATION H FOUR TIMES  A DAY IF NEEDED TO RELIEVE RECTAL PAIN/PRESSURE/BLEEDING.  Next colonoscopy BETWEEN 2024-2026.  Colonoscopy Care After Read the instructions outlined below and refer to this sheet in the next week. These discharge instructions provide you with general information on caring for yourself after you leave the hospital. While your treatment has been planned according to the most current medical practices available, unavoidable complications occasionally occur. If you have any problems or questions after discharge, call DR. Vonita Calloway, [phone].  ACTIVITY  You may resume your regular activity, but move at a slower pace for the next 24 hours.   Take frequent rest periods for the next 24 hours.   Walking will help get rid of the air and reduce the bloated feeling in your belly (abdomen).   No driving for 24 hours (because of the medicine (anesthesia) used during the test).   You may shower.   Do not sign any important legal documents or operate any machinery for 24 hours (because of the anesthesia used during the test).    NUTRITION  Drink plenty of fluids.   You may resume your normal diet as instructed by your doctor.   Begin with a light meal and progress to your normal diet. Heavy or fried foods are harder  to digest and may make you feel sick to your stomach (nauseated).   Avoid alcoholic beverages for 24 hours or as instructed.    MEDICATIONS  You may resume your normal medications.   WHAT YOU CAN EXPECT TODAY  Some feelings of bloating in the abdomen.   Passage of more gas than usual.   Spotting of blood in your stool or on the toilet paper  .  IF YOU HAD POLYPS REMOVED DURING THE COLONOSCOPY:  Eat a soft diet IF YOU HAVE NAUSEA, BLOATING, ABDOMINAL PAIN, OR VOMITING.    FINDING OUT THE RESULTS OF YOUR TEST Not all test results are available during your visit. DR. Oneida Alar WILL CALL YOU WITHIN 14 DAYS OF YOUR PROCEDUE WITH YOUR RESULTS. Do not assume everything is normal if you have not heard from DR. Aaban Griep, CALL HER OFFICE AT [phone].  SEEK IMMEDIATE MEDICAL ATTENTION AND CALL THE OFFICE: [phone] IF:  You have more than a spotting of blood in your stool.   Your belly is swollen (abdominal distention).   You are nauseated or vomiting.   You have a temperature over 101F.   You have abdominal pain or discomfort that is severe or gets worse throughout the day.  High-Fiber Diet A high-fiber diet changes your normal diet to include more whole grains, legumes, fruits, and vegetables. Changes in the diet involve replacing refined carbohydrates with unrefined foods. The calorie level of the diet is essentially unchanged. The Dietary Reference Intake (recommended amount) for adult  males is 38 grams per day. For adult females, it is 25 grams per day. Pregnant and lactating women should consume 28 grams of fiber per day.Fiber is the intact part of a plant that is not broken down during digestion. Functional fiber is fiber that has been isolated from the plant to provide a beneficial effect in the body.  PURPOSE  Increase stool bulk.   Ease and regulate bowel movements.   Lower cholesterol.   REDUCE RISK OF COLON CANCER  INDICATIONS THAT YOU NEED MORE  FIBER  Constipation and hemorrhoids.   Uncomplicated diverticulosis (intestine condition) and irritable bowel syndrome.   Weight management.   As a protective measure against hardening of the arteries (atherosclerosis), diabetes, and cancer.   GUIDELINES FOR INCREASING FIBER IN THE DIET  Start adding fiber to the diet slowly. A gradual increase of about 5 more grams (2 slices of whole-wheat bread, 2 servings of most fruits or vegetables, or 1 bowl of high-fiber cereal) per day is best. Too rapid an increase in fiber may result in constipation, flatulence, and bloating.   Drink enough water and fluids to keep your urine clear or pale yellow. Water, juice, or caffeine-free drinks are recommended. Not drinking enough fluid may cause constipation.   Eat a variety of high-fiber foods rather than one type of fiber.   Try to increase your intake of fiber through using high-fiber foods rather than fiber pills or supplements that contain small amounts of fiber.   The goal is to change the types of food eaten. Do not supplement your present diet with high-fiber foods, but replace foods in your present diet.   INCLUDE A VARIETY OF FIBER SOURCES  Replace refined and processed grains with whole grains, canned fruits with fresh fruits, and incorporate other fiber sources. White rice, white breads, and most bakery goods contain little or no fiber.   Brown whole-grain rice, buckwheat oats, and many fruits and vegetables are all good sources of fiber. These include: broccoli, Brussels sprouts, cabbage, cauliflower, beets, sweet potatoes, white potatoes (skin on), carrots, tomatoes, eggplant, squash, berries, fresh fruits, and dried fruits.   Cereals appear to be the richest source of fiber. Cereal fiber is found in whole grains and bran. Bran is the fiber-rich outer coat of cereal grain, which is largely removed in refining. In whole-grain cereals, the bran remains. In breakfast cereals, the largest  amount of fiber is found in those with "bran" in their names. The fiber content is sometimes indicated on the label.   You may need to include additional fruits and vegetables each day.   In baking, for 1 cup white flour, you may use the following substitutions:   1 cup whole-wheat flour minus 2 tablespoons.   1/2 cup white flour plus 1/2 cup whole-wheat flour.   Polyps, Colon  A polyp is extra tissue that grows inside your body. Colon polyps grow in the large intestine. The large intestine, also called the colon, is part of your digestive system. It is a long, hollow tube at the end of your digestive tract where your body makes and stores stool. Most polyps are not dangerous. They are benign. This means they are not cancerous. But over time, some types of polyps can turn into cancer. Polyps that are smaller than a pea are usually not harmful. But larger polyps could someday become or may already be cancerous. To be safe, doctors remove all polyps and test them.   PREVENTION There is not one sure way  to prevent polyps. You might be able to lower your risk of getting them if you:  Eat more fruits and vegetables and less fatty food.   Do not smoke.   Avoid alcohol.   Exercise every day.   Lose weight if you are overweight.   Eating more calcium and folate can also lower your risk of getting polyps. Some foods that are rich in calcium are milk, cheese, and broccoli. Some foods that are rich in folate are chickpeas, kidney beans, and spinach.    Diverticulosis Diverticulosis is a common condition that develops when small pouches (diverticula) form in the wall of the colon. The risk of diverticulosis increases with age. It happens more often in people who eat a low-fiber diet. Most individuals with diverticulosis have no symptoms. Those individuals with symptoms usually experience belly (abdominal) pain, constipation, or loose stools (diarrhea).  HOME CARE INSTRUCTIONS  Increase the  amount of fiber in your diet as directed by your caregiver or dietician. This may reduce symptoms of diverticulosis.   Drink at least 6 to 8 glasses of water each day to prevent constipation.   Try not to strain when you have a bowel movement.   Avoiding nuts and seeds to prevent complications is NOT NECESSARY.   FOODS HAVING HIGH FIBER CONTENT INCLUDE:  Fruits. Apple, peach, pear, tangerine, raisins, prunes.   Vegetables. Brussels sprouts, asparagus, broccoli, cabbage, carrot, cauliflower, romaine lettuce, spinach, summer squash, tomato, winter squash, zucchini.   Starchy Vegetables. Baked beans, kidney beans, lima beans, split peas, lentils, potatoes (with skin).   Grains. Whole wheat bread, brown rice, bran flake cereal, plain oatmeal, white rice, shredded wheat, bran muffins.   SEEK IMMEDIATE MEDICAL CARE IF:  You develop increasing pain or severe bloating.   You have an oral temperature above 101F.   You develop vomiting or bowel movements that are bloody or black.   Hemorrhoids Hemorrhoids are dilated (enlarged) veins around the rectum. Sometimes clots will form in the veins. This makes them swollen and painful. These are called thrombosed hemorrhoids. Causes of hemorrhoids include:  Constipation.   Straining to have a bowel movement.   HEAVY LIFTING   HOME CARE INSTRUCTIONS  Eat a well balanced diet and drink 6 to 8 glasses of water every day to avoid constipation. You may also use a bulk laxative.   Avoid straining to have bowel movements.   Keep anal area dry and clean.   Do not use a donut shaped pillow or sit on the toilet for long periods. This increases blood pooling and pain.   Move your bowels when your body has the urge; this will require less straining and will decrease pain and pressure.

## 2017-04-20 NOTE — H&P (Signed)
Primary Care Physician:  Kathyrn Drown, MD Primary Gastroenterologist:  Dr. Oneida Alar  Pre-Procedure History & Physical: HPI:  Cheryl Middleton is a 52 y.o. female here for [facility].  Past Medical History:  Diagnosis Date  . Diabetes mellitus, type II ([place])   . Hyperlipidemia   . Malignant neoplasm of tongue ([facility])    Tongue    Past Surgical History:  Procedure Laterality Date  . BREAST LUMPECTOMY    . TONGUE SURGERY    . TRACHEOSTOMY      Prior to Admission medications   Medication Sig Start Date End Date Taking? Authorizing Provider  cholecalciferol (VITAMIN D) 1000 units tablet Take 1 tablet by mouth daily. otc once daily    Yes [provider]  Insulin Glargine (TOUJEO SOLOSTAR) 300 UNIT/ML SOPN Inject 60 Units into the skin at bedtime. 03/09/17  Yes Nilda Simmer, NP  losartan (COZAAR) 25 MG tablet TAKE 1 TABLET BY MOUTH EVERY DAY 03/07/17  Yes Pearson Forster C, NP  rosuvastatin (CRESTOR) 20 MG tablet TAKE 1 TABLET BY MOUTH EVERY DAY 11/13/16  Yes Luking, Elayne Snare, MD  clotrimazole-betamethasone (LOTRISONE) cream Apply 1 application topically 2 (two) times daily. Prn up to 2 weeks at a time Patient taking differently: Apply 1 application topically 2 (two) times daily as needed (skin irritation). Prn up to 2 weeks at a time 06/23/16   Nilda Simmer, NP  glucose blood (BAYER CONTOUR TEST) test strip TEST 4 TIMES DAILY 12/25/16   Kathyrn Drown, MD    Allergies as of 03/28/2017 - Review Complete 03/27/2017  Allergen Reaction Noted  . Ciprofloxacin    . Penicillins    . Sulfonamide derivatives    . Ibuprofen Rash 05/25/2015    Family History  Problem Relation Age of Onset  . Hypertension Mother   . Diabetes Mother   . Kidney disease Mother   . Hypertension Father   . Diabetes Father   . Hypertension Sister     Social History   Socioeconomic History  . Marital status: Legally Separated    Spouse name: Not on file  . Number of  children: Not on file  . Years of education: Not on file  . Highest education level: Not on file  Social Needs  . Financial resource strain: Not on file  . Food insecurity - worry: Not on file  . Food insecurity - inability: Not on file  . Transportation needs - medical: Not on file  . Transportation needs - non-medical: Not on file  Occupational History  . Not on file  Tobacco Use  . Smoking status: Never Smoker  . Smokeless tobacco: Never Used  Substance and Sexual Activity  . Alcohol use: No  . Drug use: No  . Sexual activity: Not Currently    Birth control/protection: None  Other Topics Concern  . Not on file  Social History Narrative  . Not on file    Review of Systems: See HPI, otherwise negative ROS   Physical Exam: LMP 07/22/2016  General:   Alert,  pleasant and cooperative in NAD Head:  Normocephalic and atraumatic. Neck:  Supple; Lungs:  Clear throughout to auscultation.    Heart:  Regular rate and rhythm. Abdomen:  Soft, nontender and nondistended. Normal bowel sounds, without guarding, and without rebound.   Neurologic:  Alert and  oriented x4;  grossly normal neurologically.  Impression/Plan:     SCREENING  Plan:  1. TCS TODAY DISCUSSED PROCEDURE, BENEFITS, & RISKS: <  1% chance of medication reaction, bleeding, perforation, or rupture of spleen/liver.

## 2017-04-20 NOTE — Op Note (Signed)
[facility] Patient Name: Cheryl Middleton Procedure Date: 04/20/2017 10:29 AM MRN: [id number] Date of Birth: 10-28-1965 Attending MD: Barney Drain MD, MD CSN: [id number] Age: 52 Admit Type: Outpatient Procedure:                Colonoscopy WITH COLD SNARE POLYPECTOMY Indications:              Screening for colorectal malignant neoplasm Providers:                Barney Drain MD, MD, Rosina Lowenstein, RN, Nelma Rothman,                            Technician Referring MD:             Elayne Snare. Luking Medicines:                Meperidine 75 mg IV, Midazolam 4 mg IV Complications:            No immediate complications. Estimated Blood Loss:     Estimated blood loss was minimal. Procedure:                Pre-Anesthesia Assessment:                           - Prior to the procedure, a History and Physical                            was performed, and patient medications and                            allergies were reviewed. The patient's tolerance of                            previous anesthesia was also reviewed. The risks                            and benefits of the procedure and the sedation                            options and risks were discussed with the patient.                            All questions were answered, and informed consent                            was obtained. Prior Anticoagulants: The patient has                            taken no previous anticoagulant or antiplatelet                            agents. ASA Grade Assessment: II - A patient with                            mild systemic disease. After reviewing the risks  and benefits, the patient was deemed in                            satisfactory condition to undergo the procedure.                            After obtaining informed consent, the colonoscope                            was passed under direct vision. Throughout the                            procedure, the patient's blood  pressure, pulse, and                            oxygen saturations were monitored continuously. The                            EC-[id number] ([id number]) scope was introduced through                            the anus and advanced to the the cecum, identified                            by appendiceal orifice and ileocecal valve. The                            colonoscopy was technically difficult and complex                            due to a tortuous colon. Successful completion of                            the procedure was aided by straightening and                            shortening the scope to obtain bowel loop reduction                            and COLOWRAP. The patient tolerated the procedure                            well. The quality of the bowel preparation was                            good. The ileocecal valve, appendiceal orifice, and                            rectum were photographed. Scope In: 10:55:07 AM Scope Out: 11:14:19 AM Scope Withdrawal Time: 0 hours 15 minutes 15 seconds  Total Procedure Duration: 0 hours 19 minutes 12 seconds  Findings:      Two sessile polyps were found in the rectum. The polyps were 2 to 3 mm  in size. These polyps were removed with a cold snare. Resection and       retrieval were complete.      The recto-sigmoid colon, sigmoid colon and descending colon were       moderately redundant.      A few small and large-mouthed diverticula were found in the ascending       colon.      External hemorrhoids were found during retroflexion. The hemorrhoids       were small. Impression:               - Two 2 to 3 mm polyps in the rectum, removed with                            a cold snare. Resected and retrieved.                           - Redundant LEFT colon.                           - MILD Diverticulosis in the ascending colon.                           - The distal rectum and anal verge are normal on                             retroflexion view. Moderate Sedation:      Moderate (conscious) sedation was administered by the endoscopy nurse       and supervised by the endoscopist. The following parameters were       monitored: oxygen saturation, heart rate, blood pressure, and response       to care. Total physician intraservice time was 27 minutes. Recommendation:           - Repeat colonoscopy in 5-10 years for surveillance.                           - High fiber diet.                           - Continue present medications.                           - Await pathology results.                           - Patient has a contact number available for                            emergencies. The signs and symptoms of potential                            delayed complications were discussed with the                            patient. Return to normal activities tomorrow.  Written discharge instructions were provided to the                            patient. Procedure Code(s):        --- Professional ---                           [phone], Colonoscopy, flexible; with removal of                            tumor(s), polyp(s), or other lesion(s) by snare                            technique                           99152, Moderate sedation services provided by the                            same physician or other qualified health care                            professional performing the diagnostic or                            therapeutic service that the sedation supports,                            requiring the presence of an independent trained                            observer to assist in the monitoring of the                            patient's level of consciousness and physiological                            status; initial 15 minutes of intraservice time,                            patient age 86 years or older                           [phone], Moderate sedation services; each  additional                            15 minutes intraservice time Diagnosis Code(s):        --- Professional ---                           Z12.11, Encounter for screening for malignant                            neoplasm of colon  K62.1, Rectal polyp                           K57.30, Diverticulosis of large intestine without                            perforation or abscess without bleeding                           Q43.8, Other specified congenital malformations of                            intestine CPT copyright 2016 American Medical Association. All rights reserved. The codes documented in this report are preliminary and upon coder review may  be revised to meet current compliance requirements. Barney Drain, MD Barney Drain MD, MD 04/20/2017 11:24:02 AM This report has been signed electronically. Number of Addenda: 0

## 2017-04-24 ENCOUNTER — Encounter (HOSPITAL_COMMUNITY): Payer: Self-pay | Admitting: Gastroenterology

## 2017-04-26 NOTE — Progress Notes (Signed)
Left message to call.

## 2017-04-26 NOTE — Progress Notes (Signed)
Pt is aware.  

## 2017-05-08 ENCOUNTER — Telehealth: Payer: Self-pay | Admitting: Nurse Practitioner

## 2017-05-08 NOTE — Telephone Encounter (Signed)
error 

## 2017-05-19 ENCOUNTER — Other Ambulatory Visit: Payer: Self-pay | Admitting: Nurse Practitioner

## 2017-05-25 ENCOUNTER — Telehealth: Payer: Self-pay | Admitting: Nurse Practitioner

## 2017-05-25 NOTE — Telephone Encounter (Signed)
Review blood work results in [facility] from [facility].

## 2017-05-30 ENCOUNTER — Encounter: Payer: Self-pay | Admitting: Nurse Practitioner

## 2017-05-30 ENCOUNTER — Ambulatory Visit (INDEPENDENT_AMBULATORY_CARE_PROVIDER_SITE_OTHER): Payer: Commercial Managed Care - PPO | Admitting: Nurse Practitioner

## 2017-05-30 VITALS — BP 130/76 | Ht 61.5 in | Wt 177.0 lb

## 2017-05-30 DIAGNOSIS — E119 Type 2 diabetes mellitus without complications: Secondary | ICD-10-CM | POA: Insufficient documentation

## 2017-05-30 LAB — POCT GLYCOSYLATED HEMOGLOBIN (HGB A1C): HEMOGLOBIN A1C: 11.6

## 2017-05-30 MED ORDER — METFORMIN HCL ER (MOD) 1000 MG PO TB24: 1000.0000 mg | ORAL_TABLET | Freq: Every day | ORAL | 5 refills | Status: DC

## 2017-05-30 NOTE — Progress Notes (Signed)
Subjective:    Patient ID: Cheryl Middleton, female    DOB: 02-Apr-1965, 52 y.o.   MRN: [id number]   CC: three month DM follow up  HPI: patient is here for three month DM follow up.  No concerns today.  Past Medical History:  Diagnosis Date  . Diabetes mellitus, type II ([place])   . Hyperlipidemia   . Malignant neoplasm of tongue (HCC)    Tongue   Past Surgical History:  Procedure Laterality Date  . BREAST LUMPECTOMY    . COLONOSCOPY N/A 04/20/2017   Procedure: COLONOSCOPY;  Surgeon: Danie Binder, MD;  Location: [facility];  Service: Endoscopy;  Laterality: N/A;  12:00  . TONGUE SURGERY    . TRACHEOSTOMY     No hospitalizations, accidents, or injuries in the past three months  Allergies  Allergen Reactions  . Ciprofloxacin   . Sulfonamide Derivatives   . Ibuprofen Rash  . Penicillins Rash    Has patient had a PCN reaction causing immediate rash, facial/tongue/throat swelling, SOB or lightheadedness with hypotension: Yes Has patient had a PCN reaction causing severe rash involving mucus membranes or skin necrosis: no Has patient had a PCN reaction that required hospitalization: yes Has patient had a PCN reaction occurring within the last 10 years: yes If all of the above answers are "NO", then may proceed with Cephalosporin use.    Current Outpatient Medications on File Prior to Visit  Medication Sig Dispense Refill  . cholecalciferol (VITAMIN D) 1000 units tablet Take 1 tablet by mouth daily. otc once daily     . clotrimazole-betamethasone (LOTRISONE) cream Apply 1 application topically 2 (two) times daily. Prn up to 2 weeks at a time (Patient taking differently: Apply 1 application topically 2 (two) times daily as needed (skin irritation). Prn up to 2 weeks at a time) 30 g 0  . glucose blood (BAYER CONTOUR TEST) test strip TEST 4 TIMES DAILY 150 each 3  . losartan (COZAAR) 25 MG tablet TAKE 1 TABLET BY MOUTH EVERY DAY 90 tablet 1  . rosuvastatin (CRESTOR) 20 MG tablet  TAKE 1 TABLET BY MOUTH EVERY DAY 90 tablet 0  . TOUJEO SOLOSTAR 300 UNIT/ML SOPN INJECT 60 UNITS INTO THE SKIN AT BEDTIME. 4.5 mL 2   No current facility-administered medications on file prior to visit.    Family History  Problem Relation Age of Onset  . Hypertension Mother   . Diabetes Mother   . Kidney disease Mother   . Hypertension Father   . Diabetes Father   . Hypertension Sister    Immunization History  Administered Date(s) Administered  . Td 07/31/2008   Social history: works at the [employer], says the job is very stressful.  Wants to eat healthy but often skips breakfast, snacks throughout the day, and then eats a large carb-heavy meal for dinner.  She does not exercise now, but wants to go out and walk more once the weather gets nicer. Has never smoked or used smokeless tobacco.  Does not drink any alcohol.  No sexual partners right now.  Traveled to [place], [place], and [place] in October on a cruise ship.  LMP: June 2018  Review of Systems  Constitutional: Negative for activity change, appetite change and unexpected weight change.  Respiratory: Negative for chest tightness, shortness of breath and wheezing.   Cardiovascular: Negative for chest pain and leg swelling.  Gastrointestinal: Negative for abdominal distention, abdominal pain, constipation, diarrhea, nausea and vomiting.  Genitourinary: Negative  for difficulty urinating, dysuria, frequency and urgency.  Neurological: Negative for dizziness, syncope, speech difficulty, light-headedness, numbness and headaches.     Objective:  Blood pressure 130/76, height 5' 1.5" (1.562 m), weight 177 lb (80.3 kg), last menstrual period 07/22/2016. Body mass index is 32.9 kg/m.    Physical Exam  Constitutional: She is cooperative.  Neck: Normal carotid pulses present. Carotid bruit is not present.  Cardiovascular: Normal rate, regular rhythm, S1 normal, S2 normal and normal heart sounds.  Pulmonary/Chest:  Effort normal. No accessory muscle usage. No respiratory distress. She has no decreased breath sounds. She has no wheezes.  Abdominal: Bowel sounds are normal.  Neurological: She is alert.   Diabetic Foot Exam - Simple   Simple Foot Form Diabetic Foot exam was performed with the following findings:  Yes 05/30/2017  5:08 PM  Visual Inspection No deformities, no ulcerations, no other skin breakdown bilaterally:  Yes Sensation Testing Intact to touch and monofilament testing bilaterally:  Yes Pulse Check Posterior Tibialis and Dorsalis pulse intact bilaterally:  Yes Comments    Results for orders placed or performed in visit on 05/30/17  POCT glycosylated hemoglobin (Hb A1C)  Result Value Ref Range   Hemoglobin A1C 11.6   Last A1C was 13.9.    Assessment & Plan:   Problem List Items Addressed This Visit      Endocrine   Diabetes mellitus without complication ([facility]) - Primary   Relevant Medications   metFORMIN (GLUMETZA) 1000 MG (MOD) 24 hr tablet   Other Relevant Orders   POCT glycosylated hemoglobin (Hb A1C) (Completed)     Teaching: Continue to try to eat healthy, be active, and lose weight. Add Metformin to regimen. Went over possible gastrointestinal side effects of metformin, and instructed her to stop taking it and call the office if significant side effects.   Meds ordered this encounter  Medications  . metFORMIN (GLUMETZA) 1000 MG (MOD) 24 hr tablet    Sig: Take 1 tablet (1,000 mg total) by mouth daily with breakfast.    Dispense:  30 tablet    Refill:  5    Order Specific Question:   Supervising Provider    Answer:   Mikey Kirschner [[id number]]   Labs/diagnostic tests: will have lipid and liver tests through work and send to office. Continue insulin as directed.   Return in about 3 months (around 08/30/2017) for recheck .

## 2017-06-01 ENCOUNTER — Ambulatory Visit: Payer: Commercial Managed Care - PPO | Admitting: Nurse Practitioner

## 2017-06-01 ENCOUNTER — Encounter: Payer: Self-pay | Admitting: Nurse Practitioner

## 2017-06-01 NOTE — Telephone Encounter (Signed)
Reviewed with patient at visit on 3/13

## 2017-06-05 ENCOUNTER — Other Ambulatory Visit: Payer: Self-pay | Admitting: Nurse Practitioner

## 2017-06-05 ENCOUNTER — Telehealth: Payer: Self-pay | Admitting: *Deleted

## 2017-06-05 MED ORDER — METFORMIN HCL 1000 MG PO TABS: ORAL_TABLET | ORAL | 2 refills | Status: DC

## 2017-06-05 NOTE — Telephone Encounter (Signed)
Done

## 2017-06-05 NOTE — Telephone Encounter (Signed)
Left message to return call 

## 2017-06-05 NOTE — Telephone Encounter (Signed)
Metformin er 1000 mg gastr-tb one daily with breakfast is not covered by her insurance. Out of pocket cost is 3059. Can alternative be prescribed

## 2017-06-05 NOTE — Telephone Encounter (Signed)
Just confirmed that 500 mg ER tab is on the [facility] $4 list. She could take 2 of these. How does she want me to handle this?

## 2017-06-05 NOTE — Telephone Encounter (Signed)
[facility] states the reason is was so high is because a weird extended release version was sent over. The plain metformin 1000 mg once daily would be $12. Is it ok to change. Pt wants to get at [facility].

## 2017-07-29 ENCOUNTER — Encounter (HOSPITAL_COMMUNITY): Payer: Self-pay

## 2017-07-29 ENCOUNTER — Emergency Department (HOSPITAL_COMMUNITY): Payer: Commercial Managed Care - PPO

## 2017-07-29 ENCOUNTER — Emergency Department (HOSPITAL_COMMUNITY)
Admission: EM | Admit: 2017-07-29 | Discharge: 2017-07-29 | Disposition: A | Discharge status: Home / Self Care | Payer: Commercial Managed Care - PPO | Attending: Emergency Medicine | Admitting: Emergency Medicine

## 2017-07-29 ENCOUNTER — Other Ambulatory Visit: Payer: Self-pay

## 2017-07-29 DIAGNOSIS — S42215A Unspecified nondisplaced fracture of surgical neck of left humerus, initial encounter for closed fracture: Secondary | ICD-10-CM | POA: Diagnosis not present

## 2017-07-29 DIAGNOSIS — E119 Type 2 diabetes mellitus without complications: Secondary | ICD-10-CM | POA: Diagnosis not present

## 2017-07-29 DIAGNOSIS — Z794 Long term (current) use of insulin: Secondary | ICD-10-CM | POA: Insufficient documentation

## 2017-07-29 DIAGNOSIS — Z930 Tracheostomy status: Secondary | ICD-10-CM | POA: Diagnosis not present

## 2017-07-29 DIAGNOSIS — W01198A Fall on same level from slipping, tripping and stumbling with subsequent striking against other object, initial encounter: Secondary | ICD-10-CM | POA: Insufficient documentation

## 2017-07-29 DIAGNOSIS — Y998 Other external cause status: Secondary | ICD-10-CM | POA: Insufficient documentation

## 2017-07-29 DIAGNOSIS — Y93E1 Activity, personal bathing and showering: Secondary | ICD-10-CM | POA: Diagnosis not present

## 2017-07-29 DIAGNOSIS — Y92002 Bathroom of unspecified non-institutional (private) residence single-family (private) house as the place of occurrence of the external cause: Secondary | ICD-10-CM | POA: Diagnosis not present

## 2017-07-29 DIAGNOSIS — Z79899 Other long term (current) drug therapy: Secondary | ICD-10-CM | POA: Insufficient documentation

## 2017-07-29 DIAGNOSIS — Z85810 Personal history of malignant neoplasm of tongue: Secondary | ICD-10-CM | POA: Diagnosis not present

## 2017-07-29 DIAGNOSIS — S4992XA Unspecified injury of left shoulder and upper arm, initial encounter: Secondary | ICD-10-CM | POA: Diagnosis present

## 2017-07-29 MED ORDER — HYDROCODONE-ACETAMINOPHEN 5-325 MG PO TABS: 2.0000 | ORAL_TABLET | ORAL | 0 refills | Status: DC | PRN

## 2017-07-29 MED ORDER — ACETAMINOPHEN 500 MG PO TABS
1000.0000 mg | ORAL_TABLET | Freq: Once | ORAL | Status: AC
  Administered 2017-07-29: 1000 mg via ORAL
  Filled 2017-07-29: qty 2

## 2017-07-29 NOTE — Discharge Instructions (Signed)
You have a broken arm at the shoulder.  This fracture appears to be in line with where it needs to be but it needs to be treated with a sling and follow-up with an orthopedic surgeon. I have referred you to a local orthopedist, Dr. Aline Brochure. You may take Tylenol for mild pain, hydrocodone with Tylenol for more severe pain If you take the hydrocodone please take a stool softener as this can cause constipation If you should develop increasing pain swelling numbness or weakness of your arm or hand please return to the emergency department immediately These fractures usually heal without surgery over the course of 4 to 6 weeks

## 2017-07-29 NOTE — ED Provider Notes (Addendum)
[facility] EMERGENCY DEPARTMENT Provider Note   CSN: [id number] Arrival date & time: 07/29/17  0735     History   Chief Complaint Chief Complaint  Patient presents with  . Fall    HPI Cheryl Middleton is a 52 y.o. female.  The patient is a pleasant 52 year old female, she has a history of diabetes as well as a cancer of her tongue which required resection almost 20 years ago.  She has done extremely well with no recurrence.  She presents after having an accidental slip and fall in the shower this morning.  She reports that when she slipped and fell she struck her left shoulder on the edge of the bathtub.  This caused acute onset of pain which has been persistent, seems to be worse when she tries to let her arm hang or tries to move her arm at the shoulder.  There is no pain below the shoulder.  She denies any numbness or weakness of the arm  The history is provided by the patient and a relative.  Fall  This is a new problem. The current episode started less than 1 hour ago. The problem occurs constantly. The problem has not changed since onset.Pertinent negatives include no chest pain, no abdominal pain, no headaches and no shortness of breath. Exacerbated by: Moving the arm and using the left hand. Relieved by: Holding the arm still. She has tried nothing for the symptoms.  Shoulder Injury  Pertinent negatives include no chest pain, no abdominal pain, no headaches and no shortness of breath.    Past Medical History:  Diagnosis Date  . Diabetes mellitus, type II ([place])   . Hyperlipidemia   . Malignant neoplasm of tongue ([place])    Tongue    Patient Active Problem List   Diagnosis Date Noted  . Diabetes mellitus without complication ([place]) 70/17/7939  . Special screening for malignant neoplasms, colon   . Vitamin D deficiency 06/12/2016  . Personal history of noncompliance with medical treatment, presenting hazards to health 04/14/2015  . Uncontrolled type 2 diabetes mellitus  with complication, with long-term current use of insulin ([place]) 03/16/2015  . Essential hypertension, benign 03/16/2015  . Hyperlipidemia 07/31/2008    Past Surgical History:  Procedure Laterality Date  . BREAST LUMPECTOMY    . COLONOSCOPY N/A 04/20/2017   Procedure: COLONOSCOPY;  Surgeon: Danie Binder, MD;  Location: [facility] END[facility];  Service: Endoscopy;  Laterality: N/A;  12:00  . TONGUE SURGERY    . TRACHEOSTOMY       OB History   None      Home Medications    Prior to Admission medications   Medication Sig Start Date End Date Taking? Authorizing Provider  cholecalciferol (VITAMIN D) 1000 units tablet Take 1 tablet by mouth daily. otc once daily     [provider]  clotrimazole-betamethasone (LOTRISONE) cream Apply 1 application topically 2 (two) times daily. Prn up to 2 weeks at a time Patient taking differently: Apply 1 application topically 2 (two) times daily as needed (skin irritation). Prn up to 2 weeks at a time 06/23/16   Pearson Forster C, NP  glucose blood (BAYER CONTOUR TEST) test strip TEST 4 TIMES DAILY 12/25/16   Kathyrn Drown, MD  HYDROcodone-acetaminophen (NORCO/VICODIN) 5-325 MG tablet Take 2 tablets by mouth every 4 (four) hours as needed. 07/29/17   Noemi Chapel, MD  losartan (COZAAR) 25 MG tablet TAKE 1 TABLET BY MOUTH EVERY DAY 03/07/17   Nilda Simmer, NP  metFORMIN (GLUCOPHAGE) 1000 MG tablet Take one each day with a meal; if tolerated increase to one po BID 06/05/17   Nilda Simmer, NP  rosuvastatin (CRESTOR) 20 MG tablet TAKE 1 TABLET BY MOUTH EVERY DAY 11/13/16   Kathyrn Drown, MD  TOUJEO SOLOSTAR 300 UNIT/ML SOPN INJECT 60 UNITS INTO THE SKIN AT BEDTIME. 05/21/17   Nilda Simmer, NP    Family History Family History  Problem Relation Age of Onset  . Hypertension Mother   . Diabetes Mother   . Kidney disease Mother   . Hypertension Father   . Diabetes Father   . Hypertension Sister     Social History Social History    Tobacco Use  . Smoking status: Never Smoker  . Smokeless tobacco: Never Used  Substance Use Topics  . Alcohol use: No  . Drug use: No     Allergies   Ciprofloxacin; Sulfonamide derivatives; Ibuprofen; and Penicillins   Review of Systems Review of Systems  Respiratory: Negative for shortness of breath.   Cardiovascular: Negative for chest pain.  Gastrointestinal: Negative for abdominal pain.  Musculoskeletal: Positive for arthralgias. Negative for back pain and joint swelling.  Skin: Negative for rash and wound.  Neurological: Negative for weakness, numbness and headaches.  Hematological: Does not bruise/bleed easily.     Physical Exam Updated Vital Signs BP 135/80 (BP Location: Right Arm)   Pulse 95   Temp 98.2 F (36.8 C) (Oral)   Resp 16   Ht 5\' 1"  (1.549 m)   Wt 80.7 kg (178 lb)   LMP 07/22/2016   SpO2 100%   BMI 33.63 kg/m   Physical Exam  Constitutional: No distress.  HENT:  Head: Normocephalic and atraumatic.  Eyes: Conjunctivae are normal. Right eye exhibits no discharge. Left eye exhibits no discharge.  Cardiovascular: Normal rate and regular rhythm.  No murmur heard. Pulmonary/Chest: Effort normal. No respiratory distress.  Musculoskeletal: She exhibits tenderness ( Tenderness with range of motion of the shoulder in a passive way.  No tenderness to palpation). She exhibits no edema or deformity.  Tenderness with internal and external rotation and with trying to abduct the shoulder  Neurological: She is alert.  No numbness or weakness  Skin: Skin is warm and dry. No rash noted. She is not diaphoretic. No erythema.     ED Treatments / Results  Labs (all labs ordered are listed, but only abnormal results are displayed) Labs Reviewed - No data to display  EKG None  Radiology Dg Shoulder Left  Result Date: 07/29/2017 CLINICAL DATA:  [place] in the shower is morning, LEFT shoulder pain EXAM: LEFT SHOULDER - 2+ VIEW COMPARISON:  None FINDINGS: Mild  osseous demineralization. AC joint alignment normal. Nondisplaced fracture at surgical neck LEFT humerus, likely extending into greater tuberosity. No additional fracture, dislocation or bone destruction. Visualized LEFT ribs intact. IMPRESSION: Nondisplaced fracture at surgical neck LEFT humerus likely extending in the greater tuberosity. Electronically Signed   By: Lavonia Dana M.D.   On: 07/29/2017 08:11    Procedures Procedures (including critical care time)  Medications Ordered in ED Medications  acetaminophen (TYLENOL) tablet 1,000 mg (1,000 mg Oral Given 07/29/17 0754)     Initial Impression / Assessment and Plan / ED Course  I have reviewed the triage vital signs and the nursing notes.  Pertinent labs & imaging results that were available during my care of the patient were reviewed by me and considered in my medical decision making (see chart for details).  Clinical Course as of Jul 29 820  Nancy Fetter Jul 29, 2017  0816 I have looked at the shoulder and I believe that there is a fracture of the surgical neck - radiologist agrees - the pt has been placed in a sling and given pain medicine - she is driving -will give some opiate pain control for home and refer to ortho - Dr. Aline Brochure locally, pt agreeable    [BM]    Clinical Course User Index [BM] Noemi Chapel, MD    Neurologic exam is unremarkable, musculoskeletal exam is consistent with pain with range of motion of the shoulder but no obvious deformities.  Would consider rotator cuff injury, less likely to be bony abnormalities but x-rays will be ordered.  Patient has allergies to anti-inflammatories, Tylenol offered.  She will also be placed in a sling for comfort   Nursing applied splint  Recheck of NV status after with normal pulses  Pt informed of her results  Requests local f/u.    Final Clinical Impressions(s) / ED Diagnoses   Final diagnoses:  Closed nondisplaced fracture of surgical neck of left humerus, unspecified  fracture morphology, initial encounter    ED Discharge Orders        Ordered    HYDROcodone-acetaminophen (NORCO/VICODIN) 5-325 MG tablet  Every 4 hours PRN     07/29/17 0820       Noemi Chapel, MD 07/29/17 3202    Noemi Chapel, MD 07/29/17 [id number]

## 2017-07-29 NOTE — ED Triage Notes (Signed)
Pt reports slipped in the shower this morning and hit left shoulder.

## 2017-08-02 ENCOUNTER — Other Ambulatory Visit: Payer: Self-pay | Admitting: Nurse Practitioner

## 2017-08-02 ENCOUNTER — Ambulatory Visit (INDEPENDENT_AMBULATORY_CARE_PROVIDER_SITE_OTHER): Payer: Commercial Managed Care - PPO | Admitting: Orthopaedic Surgery

## 2017-08-02 ENCOUNTER — Encounter: Payer: Self-pay | Admitting: Orthopaedic Surgery

## 2017-08-02 VITALS — BP 147/82 | HR 110 | Ht 61.0 in | Wt 170.0 lb

## 2017-08-02 DIAGNOSIS — S42295A Other nondisplaced fracture of upper end of left humerus, initial encounter for closed fracture: Secondary | ICD-10-CM

## 2017-08-02 MED ORDER — HYDROCODONE-ACETAMINOPHEN 5-325 MG PO TABS: 1.0000 | ORAL_TABLET | ORAL | 0 refills | Status: AC | PRN

## 2017-08-02 NOTE — Progress Notes (Signed)
Subjective: I broke my shoulder    Patient ID: Cheryl Middleton, female    DOB: Mar 25, 1965, 52 y.o.   MRN: [id number]  HPI She fell in the shower at her home on 07-29-17 and hurt her left shoulder.  X-rays at the ER showed a nondisplaced fracture of the proximal left humerus.  She was given a sling and pain medicine.  She has no other injury.  She is doing better but still has pain. She has returned to work.   Review of Systems  Constitutional: Positive for activity change.  Musculoskeletal: Positive for arthralgias and joint swelling.  All other systems reviewed and are negative.  Past Medical History:  Diagnosis Date  . Diabetes mellitus, type II ([place])   . Hyperlipidemia   . Malignant neoplasm of tongue ([facility])    Tongue    Past Surgical History:  Procedure Laterality Date  . BREAST LUMPECTOMY    . COLONOSCOPY N/A 04/20/2017   Procedure: COLONOSCOPY;  Surgeon: Danie Binder, MD;  Location: [facility];  Service: Endoscopy;  Laterality: N/A;  12:00  . TONGUE SURGERY    . TRACHEOSTOMY      Current Outpatient Medications on File Prior to Visit  Medication Sig Dispense Refill  . cholecalciferol (VITAMIN D) 1000 units tablet Take 1 tablet by mouth daily. otc once daily     . clotrimazole-betamethasone (LOTRISONE) cream Apply 1 application topically 2 (two) times daily. Prn up to 2 weeks at a time (Patient taking differently: Apply 1 application topically 2 (two) times daily as needed (skin irritation). Prn up to 2 weeks at a time) 30 g 0  . glucose blood (BAYER CONTOUR TEST) test strip TEST 4 TIMES DAILY 150 each 3  . losartan (COZAAR) 25 MG tablet TAKE 1 TABLET BY MOUTH EVERY DAY 90 tablet 1  . metFORMIN (GLUCOPHAGE) 1000 MG tablet Take one each day with a meal; if tolerated increase to one po BID 60 tablet 2  . rosuvastatin (CRESTOR) 20 MG tablet TAKE 1 TABLET BY MOUTH EVERY DAY 90 tablet 0  . TOUJEO SOLOSTAR 300 UNIT/ML SOPN INJECT 60 UNITS INTO THE SKIN AT BEDTIME. 4.5 mL 2     No current facility-administered medications on file prior to visit.     Social History   Socioeconomic History  . Marital status: Legally Separated    Spouse name: Not on file  . Number of children: Not on file  . Years of education: Not on file  . Highest education level: Not on file  Occupational History  . Not on file  Social Needs  . Financial resource strain: Not on file  . Food insecurity:    Worry: Not on file    Inability: Not on file  . Transportation needs:    Medical: Not on file    Non-medical: Not on file  Tobacco Use  . Smoking status: Never Smoker  . Smokeless tobacco: Never Used  Substance and Sexual Activity  . Alcohol use: No  . Drug use: No  . Sexual activity: Not Currently    Birth control/protection: None  Lifestyle  . Physical activity:    Days per week: Not on file    Minutes per session: Not on file  . Stress: Not on file  Relationships  . Social connections:    Talks on phone: Not on file    Gets together: Not on file    Attends religious service: Not on file    Active member of club  or organization: Not on file    Attends meetings of clubs or organizations: Not on file    Relationship status: Not on file  . Intimate partner violence:    Fear of current or ex partner: Not on file    Emotionally abused: Not on file    Physically abused: Not on file    Forced sexual activity: Not on file  Other Topics Concern  . Not on file  Social History Narrative  . Not on file    Family History  Problem Relation Age of Onset  . Hypertension Mother   . Diabetes Mother   . Kidney disease Mother   . Hypertension Father   . Diabetes Father   . Hypertension Sister     BP (!) 147/82   Pulse (!) 110   Ht 5\' 1"  (1.549 m)   Wt 170 lb (77.1 kg)   LMP 07/22/2016   BMI 32.12 kg/m      Objective:   Physical Exam  Constitutional: She is oriented to person, place, and time. She appears well-developed and well-nourished.  HENT:  Head:  Normocephalic and atraumatic.  Eyes: Pupils are equal, round, and reactive to light. Conjunctivae and EOM are normal.  Neck: Normal range of motion. Neck supple.  Cardiovascular: Normal rate, regular rhythm and intact distal pulses.  Pulmonary/Chest: Effort normal.  Abdominal: Soft.  Musculoskeletal:       Left shoulder: She exhibits decreased range of motion, tenderness, bony tenderness and swelling.       Arms: Neurological: She is alert and oriented to person, place, and time. She has normal reflexes. She displays normal reflexes. No cranial nerve deficit. She exhibits normal muscle tone. Coordination normal.  Skin: Skin is warm and dry.  Psychiatric: She has a normal mood and affect. Her behavior is normal. Judgment and thought content normal.          Assessment & Plan:   Encounter Diagnosis  Name Primary?  . Other closed nondisplaced fracture of proximal end of left humerus, initial encounter Yes   Continue sling.  Precautions discussed.  Return in two weeks.  X-rays on return.  I have reviewed the [place] web site prior to prescribing narcotic medicine for this patient.  Electronically Signed Sanjuana Kava, MD 5/16/20194:19 PM

## 2017-08-16 ENCOUNTER — Ambulatory Visit (INDEPENDENT_AMBULATORY_CARE_PROVIDER_SITE_OTHER): Payer: Commercial Managed Care - PPO

## 2017-08-16 ENCOUNTER — Ambulatory Visit (INDEPENDENT_AMBULATORY_CARE_PROVIDER_SITE_OTHER): Payer: Commercial Managed Care - PPO | Admitting: Orthopaedic Surgery

## 2017-08-16 ENCOUNTER — Encounter: Payer: Self-pay | Admitting: Orthopaedic Surgery

## 2017-08-16 VITALS — BP 148/80 | HR 93 | Ht 61.0 in | Wt 176.0 lb

## 2017-08-16 DIAGNOSIS — S42295D Other nondisplaced fracture of upper end of left humerus, subsequent encounter for fracture with routine healing: Secondary | ICD-10-CM

## 2017-08-16 NOTE — Progress Notes (Signed)
CC:  My shoulder is better  She is using her sling.  She has little pain.  Left shoulder is tender.  She has resolving ecchymosis of the elbow area.  NV intact.  She had X-rays done, reported separately of the left shoulder.  Encounter Diagnosis  Name Primary?  . Other closed nondisplaced fracture of proximal end of left humerus with routine healing, subsequent encounter Yes   Return in two weeks.  Call if any problem.  Precautions discussed.   X-rays on return.  Electronically Signed Sanjuana Kava, MD 5/30/20193:03 PM

## 2017-08-23 ENCOUNTER — Encounter: Payer: Self-pay | Admitting: Orthopaedic Surgery

## 2017-08-29 ENCOUNTER — Ambulatory Visit: Payer: Commercial Managed Care - PPO | Admitting: Nurse Practitioner

## 2017-08-30 ENCOUNTER — Encounter: Payer: Self-pay | Admitting: Orthopaedic Surgery

## 2017-08-30 ENCOUNTER — Ambulatory Visit (INDEPENDENT_AMBULATORY_CARE_PROVIDER_SITE_OTHER): Payer: Self-pay | Admitting: Orthopaedic Surgery

## 2017-08-30 ENCOUNTER — Ambulatory Visit (INDEPENDENT_AMBULATORY_CARE_PROVIDER_SITE_OTHER): Payer: Commercial Managed Care - PPO

## 2017-08-30 VITALS — BP 141/78 | HR 89 | Ht 61.0 in | Wt 177.0 lb

## 2017-08-30 DIAGNOSIS — S42295D Other nondisplaced fracture of upper end of left humerus, subsequent encounter for fracture with routine healing: Secondary | ICD-10-CM | POA: Diagnosis not present

## 2017-08-30 NOTE — Progress Notes (Signed)
CC:  My shoulder is better  She has less pain of the left shoulder and more motion.  She has no numbness or new trauma.  She has abduction of 75, forward of 75 as well, extension 10.  NV intact.  She has no effusion.    X-rays were done of the left shoulder, reported separately.  Encounter Diagnosis  Name Primary?  . Other closed nondisplaced fracture of proximal end of left humerus with routine healing, subsequent encounter Yes   Return in two weeks.  I have gone over exercises for her to do at home.  I have encouraged her to get in a pool of water and do some exercises I have shown her.  X-rays on return of the left shoulder.  Call if any problem.  Precautions discussed.   Electronically Signed Sanjuana Kava, MD 6/13/20199:54 AM

## 2017-09-12 ENCOUNTER — Ambulatory Visit (INDEPENDENT_AMBULATORY_CARE_PROVIDER_SITE_OTHER): Payer: Commercial Managed Care - PPO | Admitting: Nurse Practitioner

## 2017-09-12 ENCOUNTER — Other Ambulatory Visit: Payer: Self-pay | Admitting: Nurse Practitioner

## 2017-09-12 ENCOUNTER — Encounter: Payer: Self-pay | Admitting: Nurse Practitioner

## 2017-09-12 VITALS — BP 124/72 | Ht 61.0 in | Wt 174.0 lb

## 2017-09-12 DIAGNOSIS — Z1231 Encounter for screening mammogram for malignant neoplasm of breast: Secondary | ICD-10-CM

## 2017-09-12 DIAGNOSIS — E119 Type 2 diabetes mellitus without complications: Secondary | ICD-10-CM | POA: Diagnosis not present

## 2017-09-12 DIAGNOSIS — N950 Postmenopausal bleeding: Secondary | ICD-10-CM | POA: Diagnosis not present

## 2017-09-12 LAB — POCT GLYCOSYLATED HEMOGLOBIN (HGB A1C): HEMOGLOBIN A1C: 10.2 % — AB (ref 4.0–5.6)

## 2017-09-12 NOTE — Patient Instructions (Addendum)
Increase your insulin by 2 units every 3-4 days until fasting sugar is below 150 or your reach 80 units

## 2017-09-13 ENCOUNTER — Ambulatory Visit (INDEPENDENT_AMBULATORY_CARE_PROVIDER_SITE_OTHER): Payer: Commercial Managed Care - PPO

## 2017-09-13 ENCOUNTER — Ambulatory Visit (INDEPENDENT_AMBULATORY_CARE_PROVIDER_SITE_OTHER): Payer: Self-pay | Admitting: Orthopaedic Surgery

## 2017-09-13 ENCOUNTER — Encounter: Payer: Self-pay | Admitting: Orthopaedic Surgery

## 2017-09-13 DIAGNOSIS — S42295D Other nondisplaced fracture of upper end of left humerus, subsequent encounter for fracture with routine healing: Secondary | ICD-10-CM | POA: Diagnosis not present

## 2017-09-13 NOTE — Progress Notes (Signed)
CC:  I am doing well  She has no problem with the left shoulder.  ROM of the left shoulder is full.  NV intact.  X-rays were done of the left shoulder, reported separately.  Encounter Diagnosis  Name Primary?  . Other closed nondisplaced fracture of proximal end of left humerus with routine healing, subsequent encounter Yes   I will see her in one month.  NO X-rays then.  She may call and cancel if doing very well.  Call if any problem.  Precautions discussed.   Electronically Signed Sanjuana Kava, MD 6/27/20193:01 PM

## 2017-09-14 ENCOUNTER — Ambulatory Visit (HOSPITAL_COMMUNITY)
Admission: RE | Admit: 2017-09-14 | Discharge: 2017-09-14 | Disposition: A | Discharge status: Home / Self Care | Payer: Commercial Managed Care - PPO | Source: Ambulatory Visit | Attending: Nurse Practitioner | Admitting: Nurse Practitioner

## 2017-09-14 ENCOUNTER — Encounter: Payer: Self-pay | Admitting: Nurse Practitioner

## 2017-09-14 DIAGNOSIS — Z1231 Encounter for screening mammogram for malignant neoplasm of breast: Secondary | ICD-10-CM | POA: Diagnosis present

## 2017-09-14 NOTE — Progress Notes (Signed)
Subjective: Presents for recheck on her diabetes.  Compliant with medications.  States her sugars at home are rarely under 200 at any time.  Is under tremendous stress related to her job. Denies CP/ischemic type pain or SOB. No visual changes. No difficulty speaking or swallowing. No numbness or weakness of the face, arms or legs.  Has lab work done through [employer] [facility].  Also states she had a 3-day menstrual cycle of the first time in a year recently.  No pelvic pain.   Objective:   BP 124/72   Ht 5\' 1"  (1.549 m)   Wt 174 lb (78.9 kg)   LMP 07/22/2016   BMI 32.88 kg/m  NAD.  Alert, oriented.  Lungs clear.  Heart regular rate and rhythm.  Carotids no bruits or thrills.  Lab work dated 09/12/2017 shows a glucose of 214.  See scanned lab results. Results for orders placed or performed in visit on 09/12/17  POCT glycosylated hemoglobin (Hb A1C)  Result Value Ref Range   Hemoglobin A1C 10.2 (A) 4.0 - 5.6 %   HbA1c POC (<> result, manual entry)  4.0 - 5.6 %   HbA1c, POC (prediabetic range)  5.7 - 6.4 %   HbA1c, POC (controlled diabetic range)  0.0 - 7.0 %   Explained to patient this is down from 11.6 in March but still not at goal. [facility] was 42.7 in December 2018.  Assessment:   Problem List Items Addressed This Visit      Endocrine   Diabetes mellitus without complication ([place]) - Primary   Relevant Orders   POCT glycosylated hemoglobin (Hb A1C) (Completed)    Other Visit Diagnoses    Post-menopausal bleeding       Relevant Orders   US PELVIS (TRANSABDOMINAL ONLY)   US Transvaginal Non-OB       Plan: Pelvic ultrasound to measure endometrial lining.  Further intervention based on results.  Increase your insulin by 2 units every 3-4 days until fasting sugar is below 150 or your reach 80 units.  Discussed signs and symptoms of hypoglycemia with appropriate action.  Strongly recommend regular activity and measures to help reduce stress. Return in about 3 months (around 12/13/2017) for  diabetes check up. Call back sooner if needed.

## 2017-09-19 ENCOUNTER — Ambulatory Visit (HOSPITAL_COMMUNITY): Payer: Commercial Managed Care - PPO

## 2017-10-11 ENCOUNTER — Ambulatory Visit: Payer: Commercial Managed Care - PPO | Admitting: Orthopaedic Surgery

## 2017-10-22 ENCOUNTER — Ambulatory Visit (HOSPITAL_COMMUNITY): Payer: Commercial Managed Care - PPO | Attending: Nurse Practitioner

## 2017-10-25 ENCOUNTER — Telehealth: Payer: Self-pay | Admitting: Family Medicine

## 2017-10-25 NOTE — Telephone Encounter (Signed)
Calling about prior auth for ultra sound scheduled 8-12.  Looks like the original scan was authorized but the u/s scheduled for Monday is a different test than what was originally ordered???? Call Tillie Rung [phone]

## 2017-10-25 NOTE — Telephone Encounter (Signed)
It was originally approved under primary insurance [place]- patient now has Medicaid as secondary and Medicaid requires prior approval for ultrasounds- no copy of medicaid card in chart but told it was available on website. Pre service center needs approval by Medicaid for the Ultrasound scheduled for Monday.

## 2017-10-29 ENCOUNTER — Ambulatory Visit (HOSPITAL_COMMUNITY): Admission: RE | Admit: 2017-10-29 | Payer: Medicaid Other | Source: Ambulatory Visit

## 2017-11-01 ENCOUNTER — Ambulatory Visit (HOSPITAL_COMMUNITY)
Admission: RE | Admit: 2017-11-01 | Discharge: 2017-11-01 | Disposition: A | Discharge status: Home / Self Care | Payer: Medicaid Other | Source: Ambulatory Visit | Attending: Nurse Practitioner | Admitting: Nurse Practitioner

## 2017-11-01 DIAGNOSIS — N950 Postmenopausal bleeding: Secondary | ICD-10-CM | POA: Diagnosis present

## 2017-11-23 LAB — HM DIABETES EYE EXAM

## 2017-11-29 ENCOUNTER — Encounter: Payer: Self-pay | Admitting: *Deleted

## 2017-12-06 ENCOUNTER — Other Ambulatory Visit: Payer: Self-pay

## 2017-12-06 MED ORDER — METFORMIN HCL 1000 MG PO TABS: ORAL_TABLET | ORAL | 1 refills | Status: DC

## 2017-12-17 ENCOUNTER — Telehealth: Payer: Self-pay | Admitting: *Deleted

## 2017-12-17 NOTE — Telephone Encounter (Signed)
Patient is currently on Toujeo 60 units at bedtime but this is now no [referred under Medicaid  Preferred lantus solostar or levemir flextouch Please advise

## 2017-12-18 MED ORDER — INSULIN GLARGINE 100 UNIT/ML SOLOSTAR PEN: 60.0000 [IU] | PEN_INJECTOR | Freq: Every day | SUBCUTANEOUS | 2 refills | Status: DC

## 2017-12-18 NOTE — Telephone Encounter (Signed)
The conversion rate for this is 1-1 therefore Lantus 60 units each evening she should keep track of her glucose readings she needs to make sure that she is following up at least every 6 months and she should send [place] some readings in several weeks Please make sure patient is having regular appointments to be seen for this issue May send in Lantus 60 units each evening may titrate up to 70 units if necessary 1 month supply with 2 refills

## 2017-12-18 NOTE — Telephone Encounter (Signed)
Patient advised per Dr Nicki Reaper : Her insurance no longer covers the Toujeo and prefers Lantus. The conversion rate for this is 1-1 therefore Lantus 60 units each evening she should keep track of her glucose readings she needs to make sure that she is following up at least every 6 months and she should send [place] some readings in several weeks. Patient verbalized understanding and scheduled follow up office visit this month. Prescriptions sent electronically to pharmacy.

## 2017-12-18 NOTE — Addendum Note (Signed)
Addended by: Dairl Ponder on: 12/18/2017 11:44 AM   Modules accepted: Orders

## 2017-12-24 ENCOUNTER — Encounter: Payer: Self-pay | Admitting: Family Medicine

## 2017-12-24 ENCOUNTER — Ambulatory Visit (INDEPENDENT_AMBULATORY_CARE_PROVIDER_SITE_OTHER): Payer: Medicaid Other | Admitting: Family Medicine

## 2017-12-24 VITALS — BP 126/78 | Ht 61.0 in | Wt 156.0 lb

## 2017-12-24 DIAGNOSIS — E119 Type 2 diabetes mellitus without complications: Secondary | ICD-10-CM | POA: Diagnosis not present

## 2017-12-24 DIAGNOSIS — E782 Mixed hyperlipidemia: Secondary | ICD-10-CM | POA: Diagnosis not present

## 2017-12-24 DIAGNOSIS — Z79899 Other long term (current) drug therapy: Secondary | ICD-10-CM | POA: Diagnosis not present

## 2017-12-24 DIAGNOSIS — I10 Essential (primary) hypertension: Secondary | ICD-10-CM

## 2017-12-24 LAB — POCT GLYCOSYLATED HEMOGLOBIN (HGB A1C): Hemoglobin A1C: 13 % — AB (ref 4.0–5.6)

## 2017-12-24 MED ORDER — LOSARTAN POTASSIUM 25 MG PO TABS: 25.0000 mg | ORAL_TABLET | Freq: Every day | ORAL | 1 refills | Status: DC

## 2017-12-24 MED ORDER — ROSUVASTATIN CALCIUM 20 MG PO TABS: 20.0000 mg | ORAL_TABLET | Freq: Every day | ORAL | 1 refills | Status: DC

## 2017-12-24 NOTE — Progress Notes (Addendum)
Subjective:    Patient ID: Cheryl Middleton, female    DOB: 22-Aug-1965, 52 y.o.   MRN: [id number]  Diabetes  She presents for her follow-up diabetic visit. She has type 2 diabetes mellitus. There are no diabetic associated symptoms. There are no hypoglycemic complications. There are no diabetic complications. Compliance with diabetes treatment: Pt states she has not had diabetic in about a month due to insurance.  She does not see a podiatrist.Eye exam is current.   States she has been taking the metformin, but has been out of insulin x 1 month d/t insurance changes.  Home blood sugars averaging around 300 fasting and post-prandial.  Reports intermittent light-headedness since being out of insulin, states she drinks water and sits down and it passes. Denies any low blood sugars or symptoms of hypoglycemia. Reports eye exam one month ago with stable retinopathy per patient.   Working on diet, reports 18 lb weight loss since last visit.   Results for orders placed or performed in visit on 12/24/17  POCT HgB A1C  Result Value Ref Range   Hemoglobin A1C 13.0 (A) 4.0 - 5.6 %   HbA1c POC (<> result, manual entry)     HbA1c, POC (prediabetic range)     HbA1c, POC (controlled diabetic range)     (A1C machine read greater than 13.0) Pt also states that she had the ultrasound to see why her body came out of menopause. Pt states she has had period in June and August. Pt states that Dr.Eure is suppose to be calling her but has not contacted her as of now.  HTN: doing well on current medication, reports compliance, denies any adverse effects.  HLD: doing well on current medication reports compliance, denies any adverse effects  Review of Systems  Cardiovascular: Negative for leg swelling.  Neurological: Negative for syncope.       Negative for paresthesias       Objective:   Physical Exam  Constitutional: She is oriented to person, place, and time. She appears well-developed and  well-nourished. No distress.  HENT:  Head: Normocephalic.  Neck: Neck supple.  Cardiovascular: Normal rate, regular rhythm and normal heart sounds.  No murmur heard. Pulmonary/Chest: Effort normal and breath sounds normal. No respiratory distress.  Neurological: She is alert and oriented to person, place, and time.  Skin: Skin is warm and dry.  Psychiatric: She has a normal mood and affect.  Nursing note and vitals reviewed.     Assessment & Plan:  1. Diabetes mellitus without complication [facility]) It is extremely important that Cheryl Middleton get back on her insulin.  Encouraged her to check with her pharmacy today about any barriers to getting this filled today and to let us know if there is anything we can do to assist in this process.  Restart Lantus at 40 units daily.  She needs to check her blood sugars twice a day 1 fasting and call us with these results on Friday.  At that time based on her blood sugars we will titrate her insulin.  We will check a urine microalbumin today.  She will follow-up with [place] in 4 to 6 weeks for recheck on diabetes.  - POCT HgB A1C - Urine Microalbumin w/creat. ratio  2. Essential hypertension, benign Blood pressures well controlled on current medications.  Will send in refills to her pharmacy.  Lab work before next visit. - Basic Metabolic Panel (BMET)  3. Mixed hyperlipidemia We will get a lipid profile before her next  visit.  Continue current medication, refill sent to pharmacy. - Lipid Profile  4. High risk medication use - Hepatic function panel  Dr. Sallee Lange was consulted on this case and in agreement with the above plan.

## 2017-12-24 NOTE — Patient Instructions (Signed)
Start taking lantus 40 units daily, check blood sugars twice a day and write them down. Call us on Friday with your blood sugar results and we will decide then how to titrate your insulin.

## 2017-12-25 LAB — HEPATIC FUNCTION PANEL
ALBUMIN: 4.7 g/dL (ref 3.5–5.5)
ALT: 12 IU/L (ref 0–32)
AST: 14 IU/L (ref 0–40)
Alkaline Phosphatase: 126 IU/L — ABNORMAL HIGH (ref 39–117)
BILIRUBIN TOTAL: 0.3 mg/dL (ref 0.0–1.2)
BILIRUBIN, DIRECT: 0.07 mg/dL (ref 0.00–0.40)
Total Protein: 7.8 g/dL (ref 6.0–8.5)

## 2017-12-25 LAB — BASIC METABOLIC PANEL
BUN / CREAT RATIO: 15 (ref 9–23)
BUN: 12 mg/dL (ref 6–24)
CALCIUM: 10.1 mg/dL (ref 8.7–10.2)
CO2: 18 mmol/L — ABNORMAL LOW (ref 20–29)
Chloride: 94 mmol/L — ABNORMAL LOW (ref 96–106)
Creatinine, Ser: 0.79 mg/dL (ref 0.57–1.00)
GFR, EST AFRICAN AMERICAN: 101 mL/min/{1.73_m2} (ref >59)
GFR, EST NON AFRICAN AMERICAN: 88 mL/min/{1.73_m2} (ref >59)
Glucose: 340 mg/dL — ABNORMAL HIGH (ref 65–99)
Potassium: 5.3 mmol/L — ABNORMAL HIGH (ref 3.5–5.2)
Sodium: 136 mmol/L (ref 134–144)

## 2017-12-25 LAB — LIPID PANEL
CHOLESTEROL TOTAL: 303 mg/dL — AB (ref 100–199)
Chol/HDL Ratio: 4.7 ratio — ABNORMAL HIGH (ref 0.0–4.4)
HDL: 64 mg/dL (ref >39)
LDL Calculated: 189 mg/dL — ABNORMAL HIGH (ref 0–99)
Triglycerides: 252 mg/dL — ABNORMAL HIGH (ref 0–149)
VLDL Cholesterol Cal: 50 mg/dL — ABNORMAL HIGH (ref 5–40)

## 2017-12-25 LAB — MICROALBUMIN / CREATININE URINE RATIO
Creatinine, Urine: 58.2 mg/dL
MICROALBUM., U, RANDOM: 105.6 ug/mL
Microalb/Creat Ratio: 181.4 mg/g creat — ABNORMAL HIGH (ref 0.0–30.0)

## 2017-12-25 NOTE — Addendum Note (Signed)
Addended by: Karle Barr on: 12/25/2017 01:46 PM   Modules accepted: Orders

## 2018-01-22 ENCOUNTER — Ambulatory Visit: Payer: Medicaid Other | Admitting: Family Medicine

## 2018-01-29 ENCOUNTER — Encounter: Payer: Self-pay | Admitting: Family Medicine

## 2018-01-29 ENCOUNTER — Other Ambulatory Visit: Payer: Self-pay | Admitting: *Deleted

## 2018-01-29 ENCOUNTER — Ambulatory Visit: Payer: Medicaid Other | Admitting: Family Medicine

## 2018-01-29 VITALS — BP 118/72 | Ht 61.0 in | Wt 168.8 lb

## 2018-01-29 DIAGNOSIS — Z794 Long term (current) use of insulin: Secondary | ICD-10-CM

## 2018-01-29 DIAGNOSIS — E118 Type 2 diabetes mellitus with unspecified complications: Secondary | ICD-10-CM

## 2018-01-29 DIAGNOSIS — IMO0002 Reserved for concepts with insufficient information to code with codable children: Secondary | ICD-10-CM

## 2018-01-29 DIAGNOSIS — E1165 Type 2 diabetes mellitus with hyperglycemia: Secondary | ICD-10-CM

## 2018-01-29 DIAGNOSIS — N950 Postmenopausal bleeding: Secondary | ICD-10-CM

## 2018-01-29 NOTE — Progress Notes (Signed)
   Subjective:    Patient ID: Cheryl Middleton, female    DOB: 1966-01-26, 52 y.o.   MRN: [id number]  Diabetes  She presents for her follow-up diabetic visit. She has type 2 diabetes mellitus. Pertinent negatives for hypoglycemia include no dizziness. Pertinent negatives for diabetes include no chest pain, no polydipsia, no polyphagia, no polyuria and no weakness. Current diabetic treatments: lantus 60 units qhs, metformin 1,000mg  bid. She is compliant with treatment all of the time. She does not see a podiatrist.Eye exam is current (summer 2019).   needs rx for pen needles for lantus solostar pen. A1C done on bloodwork oct 7th. Result 13.0.  Pt states no concerns today. Declines flu vaccine.   Last week fasting blood sugars ranging from 70-185. Denies symptoms of hypoglycemia. Reports 3 blood sugars last week less than 80. Taking 60 units of lantus qhs, Metformin bid. Reports compliance with medications.   Pt has resumed taking crestor, denies adverse effects.   Pt also reports she was supposed to have referral to GYN for f/u on ultrasound for postmenopausal bleeding back in August but has not heard from them.   Review of Systems  Eyes: Negative for visual disturbance.  Respiratory: Negative for shortness of breath.   Cardiovascular: Negative for chest pain.  Endocrine: Negative for polydipsia, polyphagia and polyuria.  Neurological: Negative for dizziness, syncope, weakness, light-headedness and numbness.       Objective:   Physical Exam  Constitutional: She is oriented to person, place, and time. She appears well-developed and well-nourished. No distress.  HENT:  Head: Normocephalic and atraumatic.  Neck: Neck supple.  Cardiovascular: Normal rate, regular rhythm and normal heart sounds.  No murmur heard. Pulmonary/Chest: Effort normal and breath sounds normal. No respiratory distress.  Neurological: She is alert and oriented to person, place, and time.  Skin: Skin is warm and  dry.  Psychiatric: She has a normal mood and affect.  Nursing note and vitals reviewed.      Assessment & Plan:  Uncontrolled type 2 diabetes mellitus with complication, with long-term current use of insulin (HCC)  Decrease lantus to 56 units at bedtime, d/t several fasting blood sugars <80. Check blood sugars bid, discussed random sampling and goals for blood sugars, will notify us in a couple weeks of results. Will f/u with Dr. Nicki Reaper in 6 weeks and discuss possibility of needing short acting insulin. Will notify us if any hypoglycemia episodes.   Referral placed for GYN, instructed patient if she does not hear anything in the next couple weeks she should let us know.  Dr. Sallee Lange was consulted on this case and is in agreement with the above treatment plan.

## 2018-01-29 NOTE — Patient Instructions (Signed)
Decrease lantus to 56 units at bedtime.  Check blood sugars twice daily and send [place] your readings in the next couple weeks. Check once in the morning and once either before dinner or at bedtime.   Goal blood sugars: Fasting: 100-130 Before meals: <160 Bedtime: <180

## 2018-02-06 ENCOUNTER — Other Ambulatory Visit: Payer: Self-pay | Admitting: Family Medicine

## 2018-02-07 ENCOUNTER — Encounter: Payer: Self-pay | Admitting: Obstetrics & Gynecology

## 2018-02-07 ENCOUNTER — Ambulatory Visit: Payer: Medicaid Other | Admitting: Obstetrics & Gynecology

## 2018-02-07 VITALS — BP 145/81 | HR 105 | Ht 61.5 in | Wt 166.0 lb

## 2018-02-07 DIAGNOSIS — N924 Excessive bleeding in the premenopausal period: Secondary | ICD-10-CM

## 2018-02-10 ENCOUNTER — Encounter: Payer: Self-pay | Admitting: Obstetrics & Gynecology

## 2018-02-10 NOTE — Progress Notes (Signed)
Chief Complaint  Patient presents with  . discuss [place] from 8/15      52 y.o. G1P1001 Patient's last menstrual period was 07/22/2016. The current method of family planning is tubal ligation.  Outpatient Encounter Medications as of 02/07/2018  Medication Sig Note  . cholecalciferol (VITAMIN D) 1000 units tablet Take 1 tablet by mouth daily. otc once daily    . glucose blood (BAYER CONTOUR TEST) test strip TEST 4 TIMES DAILY   . Insulin Glargine (LANTUS SOLOSTAR) 100 UNIT/ML Solostar Pen Inject 60 Units into the skin at bedtime. May titrate to 70 units 01/29/2018: Take 56 units at bedtime  . losartan (COZAAR) 25 MG tablet Take 1 tablet (25 mg total) by mouth daily.   . metFORMIN (GLUCOPHAGE) 1000 MG tablet TAKE 1 TABLET BY MOUTH EVERY DAY WITH FOOD, IF TOLERATED INCREASE TO TWICE A DAY   . rosuvastatin (CRESTOR) 20 MG tablet Take 1 tablet (20 mg total) by mouth daily.   . [DISCONTINUED] clotrimazole-betamethasone (LOTRISONE) cream Apply 1 application topically 2 (two) times daily. Prn up to 2 weeks at a time (Patient taking differently: Apply 1 application topically 2 (two) times daily as needed (skin irritation). Prn up to 2 weeks at a time)    No facility-administered encounter medications on file as of 02/07/2018.     Subjective Pt had perimenopausal bleeding and sonogram was ordered outside of our office 10/2017 which revealed 4 mm stripe  She has had a couple of periods over the past year maybe 3 so she is not yet menopausal The endometrium has no pathology We talked at length regarding her diabetes metabolic syndrome and discussed "The End of Diabetes" by Dr Excell Seltzer and she is ordring it Past Medical History:  Diagnosis Date  . Cancer (HCC)    tongue  . Diabetes mellitus, type II ([place])   . Hyperlipidemia   . Malignant neoplasm of tongue (HCC)    Tongue    Past Surgical History:  Procedure Laterality Date  . BREAST LUMPECTOMY    . COLONOSCOPY N/A 04/20/2017   Procedure: COLONOSCOPY;  Surgeon: Danie Binder, MD;  Location: [facility];  Service: Endoscopy;  Laterality: N/A;  12:00  . TONGUE SURGERY    . TRACHEOSTOMY      OB History    Gravida  1   Para  1   Term  1   Preterm      AB      Living  1     SAB      TAB      Ectopic      Multiple      Live Births  1           Allergies  Allergen Reactions  . Ciprofloxacin   . Sulfonamide Derivatives   . Ibuprofen Rash  . Penicillins Rash    Has patient had a PCN reaction causing immediate rash, facial/tongue/throat swelling, SOB or lightheadedness with hypotension: Yes Has patient had a PCN reaction causing severe rash involving mucus membranes or skin necrosis: no Has patient had a PCN reaction that required hospitalization: yes Has patient had a PCN reaction occurring within the last 10 years: yes If all of the above answers are "NO", then may proceed with Cephalosporin use.     Social History   Socioeconomic History  . Marital status: Legally Separated    Spouse name: Not on file  . Number of children: Not on file  . Years  of education: Not on file  . Highest education level: Not on file  Occupational History  . Not on file  Social Needs  . Financial resource strain: Not on file  . Food insecurity:    Worry: Not on file    Inability: Not on file  . Transportation needs:    Medical: Not on file    Non-medical: Not on file  Tobacco Use  . Smoking status: Never Smoker  . Smokeless tobacco: Never Used  Substance and Sexual Activity  . Alcohol use: No  . Drug use: No  . Sexual activity: Yes    Birth control/protection: Post-menopausal  Lifestyle  . Physical activity:    Days per week: Not on file    Minutes per session: Not on file  . Stress: Not on file  Relationships  . Social connections:    Talks on phone: Not on file    Gets together: Not on file    Attends religious service: Not on file    Active member of club or organization: Not on  file    Attends meetings of clubs or organizations: Not on file    Relationship status: Not on file  Other Topics Concern  . Not on file  Social History Narrative  . Not on file    Family History  Problem Relation Age of Onset  . Hypertension Mother   . Diabetes Mother   . Kidney disease Mother   . Hypertension Father   . Diabetes Father   . Congestive Heart Failure Father   . COPD Father   . Hypertension Sister   . Diabetes Sister   . Diabetes Sister        borderline  . ADD / ADHD Son   . Eczema Son     Medications:       Current Outpatient Medications:  .  cholecalciferol (VITAMIN D) 1000 units tablet, Take 1 tablet by mouth daily. otc once daily , Disp: , Rfl:  .  glucose blood (BAYER CONTOUR TEST) test strip, TEST 4 TIMES DAILY, Disp: 150 each, Rfl: 3 .  Insulin Glargine (LANTUS SOLOSTAR) 100 UNIT/ML Solostar Pen, Inject 60 Units into the skin at bedtime. May titrate to 70 units, Disp: 5 pen, Rfl: 2 .  losartan (COZAAR) 25 MG tablet, Take 1 tablet (25 mg total) by mouth daily., Disp: 90 tablet, Rfl: 1 .  metFORMIN (GLUCOPHAGE) 1000 MG tablet, TAKE 1 TABLET BY MOUTH EVERY DAY WITH FOOD, IF TOLERATED INCREASE TO TWICE A DAY, Disp: 60 tablet, Rfl: 2 .  rosuvastatin (CRESTOR) 20 MG tablet, Take 1 tablet (20 mg total) by mouth daily., Disp: 90 tablet, Rfl: 1  Objective Blood pressure (!) 145/81, pulse (!) 105, height 5' 1.5" (1.562 m), weight 166 lb (75.3 kg), last menstrual period 07/22/2016.  Gen WDWN NAD  Pertinent ROS .lherosm   Labs or studies Reviewed labs and sonogram    Impression Diagnoses this Encounter::   ICD-10-CM   1. Abnormal perimenopausal bleeding N92.4    normal endometrium     Established relevant diagnosis(es):   Plan/Recommendations: No orders of the defined types were placed in this encounter.   Labs or Scans Ordered: No orders of the defined types were placed in this encounter.   Management:: > no management needed now if  persists will cycle with progesteron  Follow up Return if symptoms worsen or fail to improve.        Face to face time:  20 minutes  Greater than  50% of the visit time was spent in counseling and coordination of care with the patient.  The summary and outline of the counseling and care coordination is summarized in the note above.   All questions were answered.

## 2018-03-15 ENCOUNTER — Encounter: Payer: Self-pay | Admitting: Family Medicine

## 2018-03-15 ENCOUNTER — Ambulatory Visit: Payer: Medicaid Other | Admitting: Family Medicine

## 2018-03-15 VITALS — BP 134/82 | Ht 61.5 in | Wt 173.6 lb

## 2018-03-15 DIAGNOSIS — I10 Essential (primary) hypertension: Secondary | ICD-10-CM

## 2018-03-15 DIAGNOSIS — E1165 Type 2 diabetes mellitus with hyperglycemia: Secondary | ICD-10-CM

## 2018-03-15 DIAGNOSIS — E782 Mixed hyperlipidemia: Secondary | ICD-10-CM

## 2018-03-15 DIAGNOSIS — B309 Viral conjunctivitis, unspecified: Secondary | ICD-10-CM

## 2018-03-15 DIAGNOSIS — IMO0002 Reserved for concepts with insufficient information to code with codable children: Secondary | ICD-10-CM

## 2018-03-15 DIAGNOSIS — Z23 Encounter for immunization: Secondary | ICD-10-CM

## 2018-03-15 DIAGNOSIS — Z794 Long term (current) use of insulin: Secondary | ICD-10-CM

## 2018-03-15 DIAGNOSIS — E118 Type 2 diabetes mellitus with unspecified complications: Secondary | ICD-10-CM

## 2018-03-15 NOTE — Progress Notes (Signed)
Subjective:    Patient ID: Cheryl Middleton, female    DOB: October 04, 1965, 52 y.o.   MRN: [id number]  Diabetes  She presents for her follow-up diabetic visit. She has type 2 diabetes mellitus. There are no hypoglycemic associated symptoms. Pertinent negatives for hypoglycemia include no confusion or dizziness. Pertinent negatives for diabetes include no chest pain, no fatigue, no polydipsia, no polyphagia and no weakness. (Pt states her hand tingling at night and in the morning. ) There are no hypoglycemic complications. There are no diabetic complications. She does not see a podiatrist.Eye exam is current.   Pt also states her left eye has an itchy feeling like she has makeup stuck in it. Only happened this week.   Patient for blood pressure check up.  The patient does have hypertension.  The patient is on medication.  Patient relates compliance with meds. Todays BP reviewed with the patient. Patient denies issues with medication. Patient relates reasonable diet. Patient tries to minimize salt. Patient aware of BP goals.  Patient here for follow-up regarding cholesterol.  The patient does have hyperlipidemia.  Patient does try to maintain a reasonable diet.  Patient does take the medication on a regular basis.  Denies missing a dose.  The patient denies any obvious side effects.  Prior blood work results reviewed with the patient.  The patient is aware of his cholesterol goals and the need to keep it under good control to lessen the risk of disease.    Review of Systems  Constitutional: Negative for activity change, appetite change and fatigue.  HENT: Negative for congestion and rhinorrhea.   Respiratory: Negative for cough and shortness of breath.   Cardiovascular: Negative for chest pain and leg swelling.  Gastrointestinal: Negative for abdominal pain and diarrhea.  Endocrine: Negative for polydipsia and polyphagia.  Skin: Negative for color change.  Neurological: Negative for dizziness and  weakness.  Psychiatric/Behavioral: Negative for behavioral problems and confusion.       Objective:   Physical Exam Vitals signs reviewed.  Constitutional:      General: She is not in acute distress. HENT:     Head: Normocephalic and atraumatic.  Eyes:     General:        Right eye: No discharge.        Left eye: No discharge.  Neck:     Trachea: No tracheal deviation.  Cardiovascular:     Rate and Rhythm: Normal rate and regular rhythm.     Heart sounds: Normal heart sounds. No murmur.  Pulmonary:     Effort: Pulmonary effort is normal. No respiratory distress.     Breath sounds: Normal breath sounds.  Lymphadenopathy:     Cervical: No cervical adenopathy.  Skin:    General: Skin is warm and dry.  Neurological:     Mental Status: She is alert.     Coordination: Coordination normal.  Psychiatric:        Behavior: Behavior normal.    She has mild conjunctivitis in the left eye there is no sign of any foreign body noted there is no change of her pupil       Assessment & Plan:  Diabetes I reviewed over her readings they are actually better than what I expected she will do lab work in January and follow-up in 4 months  HTN- Patient was seen today as part of a visit regarding hypertension. The importance of healthy diet and regular physical activity was discussed. The importance of compliance with  medications discussed.  Ideal goal is to keep blood pressure low elevated levels certainly below 281/18 when possible.  The patient was counseled that keeping blood pressure under control lessen his risk of complications.  The importance of regular follow-ups was discussed with the patient.  Low-salt diet such as DASH recommended.  Regular physical activity was recommended as well.  Patient was advised to keep regular follow-ups.  The patient was seen today as part of an evaluation regarding hyperlipidemia.  Recent lab work has been reviewed with the patient as well as the  goals for good cholesterol care.  In addition to this medications have been discussed the importance of compliance with diet and medications discussed as well.  Finally the patient is aware that poor control of cholesterol, noncompliance can dramatically increase the risk of complications. The patient will keep regular office visits and the patient does agreed to periodic lab work.  I did encourage her to get her eye exam completed  Viral conjunctivitis of the eyes no need for any type of antibiotic drops  Medication refills were given follow-up in 4 to 6 months sooner if any problems  25 minutes was spent with the patient.  This statement verifies that 25 minutes was indeed spent with the patient.  More than 50% of this visit-total duration of the visit-was spent in counseling and coordination of care. The issues that the patient came in for today as reflected in the diagnosis (s) please refer to documentation for further details.

## 2018-04-01 LAB — HEPATIC FUNCTION PANEL
ALK PHOS: 128 IU/L — AB (ref 39–117)
ALT: 12 IU/L (ref 0–32)
AST: 14 IU/L (ref 0–40)
Albumin: 4.3 g/dL (ref 3.5–5.5)
Bilirubin Total: 0.3 mg/dL (ref 0.0–1.2)
Bilirubin, Direct: 0.07 mg/dL (ref 0.00–0.40)
Total Protein: 7.4 g/dL (ref 6.0–8.5)

## 2018-04-01 LAB — BASIC METABOLIC PANEL
BUN / CREAT RATIO: 15 (ref 9–23)
BUN: 11 mg/dL (ref 6–24)
CO2: 27 mmol/L (ref 20–29)
CREATININE: 0.72 mg/dL (ref 0.57–1.00)
Calcium: 9.2 mg/dL (ref 8.7–10.2)
Chloride: 98 mmol/L (ref 96–106)
GFR, EST AFRICAN AMERICAN: 112 mL/min/{1.73_m2} (ref >59)
GFR, EST NON AFRICAN AMERICAN: 97 mL/min/{1.73_m2} (ref >59)
Glucose: 178 mg/dL — ABNORMAL HIGH (ref 65–99)
Potassium: 4.9 mmol/L (ref 3.5–5.2)
SODIUM: 139 mmol/L (ref 134–144)

## 2018-04-01 LAB — HEMOGLOBIN A1C
ESTIMATED AVERAGE GLUCOSE: 260 mg/dL
HEMOGLOBIN A1C: 10.7 % — AB (ref 4.8–5.6)

## 2018-04-01 LAB — LIPID PANEL
CHOLESTEROL TOTAL: 125 mg/dL (ref 100–199)
Chol/HDL Ratio: 2.3 ratio (ref 0.0–4.4)
HDL: 54 mg/dL (ref >39)
LDL Calculated: 58 mg/dL (ref 0–99)
Triglycerides: 65 mg/dL (ref 0–149)
VLDL CHOLESTEROL CAL: 13 mg/dL (ref 5–40)

## 2018-04-15 ENCOUNTER — Other Ambulatory Visit: Payer: Self-pay | Admitting: *Deleted

## 2018-04-15 DIAGNOSIS — Z029 Encounter for administrative examinations, unspecified: Secondary | ICD-10-CM

## 2018-04-15 MED ORDER — EMPAGLIFLOZIN 10 MG PO TABS: 10.0000 mg | ORAL_TABLET | ORAL | 0 refills | Status: DC

## 2018-05-28 ENCOUNTER — Other Ambulatory Visit: Payer: Self-pay | Admitting: Family Medicine

## 2018-06-04 ENCOUNTER — Telehealth: Payer: Self-pay | Admitting: Family Medicine

## 2018-06-04 ENCOUNTER — Other Ambulatory Visit: Payer: Self-pay | Admitting: *Deleted

## 2018-06-04 MED ORDER — EMPAGLIFLOZIN 10 MG PO TABS: 10.0000 mg | ORAL_TABLET | Freq: Every morning | ORAL | 2 refills | Status: DC

## 2018-06-04 MED ORDER — INSULIN GLARGINE 100 UNIT/ML SOLOSTAR PEN
60.0000 [IU] | PEN_INJECTOR | Freq: Every day | SUBCUTANEOUS | 2 refills | Status: DC
Start: 2018-06-04 — End: 2018-08-15

## 2018-06-04 NOTE — Telephone Encounter (Signed)
Refills sent to pharm per dr Nicki Reaper. Left message to return call

## 2018-06-04 NOTE — Telephone Encounter (Signed)
Pt wants lantus and jardiance. She has coupons from online she will use for both of these. The [facility] already has the jardiance but the lantus needs to be called in.   [id number] - [place], [place] [address]

## 2018-06-04 NOTE — Telephone Encounter (Signed)
Pt called back and wants to continue jardiance and lantus because she has coupons to use.

## 2018-06-04 NOTE — Telephone Encounter (Signed)
Patient states insurance is not covering her lantus,jardiance 10 mg,rosuvastatin 20 mg they will cover atorzastatin 20 mg which is equal to rosuvastatin 20 mg  She is checking with her insurance company to see what they will cover with her other medications and she will call us back to let us know she uses [facility]

## 2018-06-06 NOTE — Telephone Encounter (Signed)
Pt.notified

## 2018-07-17 ENCOUNTER — Ambulatory Visit: Payer: Medicaid Other | Admitting: Family Medicine

## 2018-08-06 ENCOUNTER — Telehealth: Payer: Self-pay | Admitting: Family Medicine

## 2018-08-06 ENCOUNTER — Other Ambulatory Visit: Payer: Self-pay | Admitting: Family Medicine

## 2018-08-06 MED ORDER — METFORMIN HCL 1000 MG PO TABS: ORAL_TABLET | ORAL | 1 refills | Status: DC

## 2018-08-06 NOTE — Telephone Encounter (Signed)
Medication sent to requesting pharmacy.

## 2018-08-06 NOTE — Telephone Encounter (Signed)
May have this +1 refill needs office visit 

## 2018-08-06 NOTE — Telephone Encounter (Signed)
Pharmacy requesting refill on Metformin 1000 mg tablet. Take one each day with meal if tolerated increase to one twice daily. Pt last seen 03/15/18 for HTN. Please advise. Thank you

## 2018-08-07 NOTE — Telephone Encounter (Signed)
Worked on PA today; awaiting results

## 2018-08-08 ENCOUNTER — Telehealth: Payer: Self-pay | Admitting: *Deleted

## 2018-08-08 NOTE — Telephone Encounter (Signed)
Fax from [facility]. lantus solostar is denied. Has to try and fail basaglar and tresiba. See letter in dr scott's folder.

## 2018-08-15 ENCOUNTER — Other Ambulatory Visit: Payer: Self-pay | Admitting: *Deleted

## 2018-08-15 ENCOUNTER — Telehealth: Payer: Self-pay | Admitting: Family Medicine

## 2018-08-15 MED ORDER — BASAGLAR KWIKPEN 100 UNIT/ML ~~LOC~~ SOPN: PEN_INJECTOR | SUBCUTANEOUS | 3 refills | Status: DC

## 2018-08-15 NOTE — Telephone Encounter (Signed)
Pt is out of lantus. She ran out on Monday. She was taking between 40 -60 units.

## 2018-08-15 NOTE — Telephone Encounter (Signed)
Patient contacted her insurance company due to the fact that they will no longer pay for her lantus and they gave her these to medication they would pay for similar to lantus. They gave her basaglara, tresida the insurance will pay . She would like the 1 tier medication if possible. [facility] [place]

## 2018-08-15 NOTE — Telephone Encounter (Signed)
tbe basaglare at the same dosage refill , plus three ref

## 2018-08-15 NOTE — Telephone Encounter (Signed)
Med sent to pharm. Pt notified.  

## 2018-08-21 NOTE — Telephone Encounter (Signed)
I agree with [employer]

## 2018-08-27 NOTE — Telephone Encounter (Signed)
This med was sent in on 5/28

## 2018-10-19 ENCOUNTER — Other Ambulatory Visit: Payer: Self-pay | Admitting: Family Medicine

## 2018-10-22 NOTE — Telephone Encounter (Signed)
Needs follow-up visit set up, and then may have 1 refill with 1 additional refill once the appointment is set up

## 2018-10-23 ENCOUNTER — Other Ambulatory Visit: Payer: Self-pay | Admitting: Family Medicine

## 2018-10-23 DIAGNOSIS — Z1231 Encounter for screening mammogram for malignant neoplasm of breast: Secondary | ICD-10-CM

## 2018-10-23 NOTE — Telephone Encounter (Signed)
Left message to schedule appointment

## 2018-10-24 ENCOUNTER — Telehealth: Payer: Self-pay | Admitting: Family Medicine

## 2018-10-24 NOTE — Telephone Encounter (Signed)
error 

## 2018-10-29 NOTE — Telephone Encounter (Signed)
Patient scheduled appointment for 12/03/18, she states has enough medication until the end of the month.

## 2018-11-10 ENCOUNTER — Other Ambulatory Visit: Payer: Self-pay | Admitting: Family Medicine

## 2018-11-11 ENCOUNTER — Other Ambulatory Visit: Payer: Self-pay | Admitting: Family Medicine

## 2018-11-11 NOTE — Telephone Encounter (Signed)
May have 1 refill 

## 2018-11-14 ENCOUNTER — Telehealth: Payer: Self-pay | Admitting: Family Medicine

## 2018-11-14 ENCOUNTER — Other Ambulatory Visit: Payer: Self-pay | Admitting: *Deleted

## 2018-11-14 DIAGNOSIS — E119 Type 2 diabetes mellitus without complications: Secondary | ICD-10-CM

## 2018-11-14 DIAGNOSIS — E785 Hyperlipidemia, unspecified: Secondary | ICD-10-CM

## 2018-11-14 DIAGNOSIS — I10 Essential (primary) hypertension: Secondary | ICD-10-CM

## 2018-11-14 MED ORDER — ROSUVASTATIN CALCIUM 20 MG PO TABS: 20.0000 mg | ORAL_TABLET | Freq: Every day | ORAL | 0 refills | Status: DC

## 2018-11-14 NOTE — Telephone Encounter (Signed)
Discussed with pt. Pt verbalized understanding. Orders put in for bw and refill sent to pharm.

## 2018-11-14 NOTE — Telephone Encounter (Signed)
Pharmacy requesting refill on Rosuvastatin 20 mg tablets. Take one tablet by mouth every day. Pt last seen 03/15/18 for HTN; has upcoming appt on 12/03/2018. Please advise. Thank you

## 2018-11-14 NOTE — Telephone Encounter (Signed)
The patient may have 90-day refill I also recommend that the patient complete A1c, lipid, met 7, urine ACR before her next visit

## 2018-11-25 ENCOUNTER — Other Ambulatory Visit: Payer: Self-pay | Admitting: Family Medicine

## 2018-11-28 LAB — BASIC METABOLIC PANEL
BUN/Creatinine Ratio: 16 (ref 9–23)
BUN: 13 mg/dL (ref 6–24)
CO2: 24 mmol/L (ref 20–29)
Calcium: 9.4 mg/dL (ref 8.7–10.2)
Chloride: 100 mmol/L (ref 96–106)
Creatinine, Ser: 0.79 mg/dL (ref 0.57–1.00)
GFR calc Af Amer: 100 mL/min/{1.73_m2} (ref >59)
GFR calc non Af Amer: 87 mL/min/{1.73_m2} (ref >59)
Glucose: 148 mg/dL — ABNORMAL HIGH (ref 65–99)
Potassium: 4.4 mmol/L (ref 3.5–5.2)
Sodium: 141 mmol/L (ref 134–144)

## 2018-11-28 LAB — LIPID PANEL
Chol/HDL Ratio: 2.3 ratio (ref 0.0–4.4)
Cholesterol, Total: 137 mg/dL (ref 100–199)
HDL: 60 mg/dL (ref >39)
LDL Chol Calc (NIH): 61 mg/dL (ref 0–99)
Triglycerides: 84 mg/dL (ref 0–149)
VLDL Cholesterol Cal: 16 mg/dL (ref 5–40)

## 2018-11-28 LAB — MICROALBUMIN / CREATININE URINE RATIO
Creatinine, Urine: 33.9 mg/dL
Microalb/Creat Ratio: 21 mg/g creat (ref 0–29)
Microalbumin, Urine: 7.2 ug/mL

## 2018-11-28 LAB — HEMOGLOBIN A1C
Est. average glucose Bld gHb Est-mCnc: 229 mg/dL
Hgb A1c MFr Bld: 9.6 % — ABNORMAL HIGH (ref 4.8–5.6)

## 2018-12-03 ENCOUNTER — Other Ambulatory Visit: Payer: Self-pay

## 2018-12-03 ENCOUNTER — Encounter: Payer: Self-pay | Admitting: Family Medicine

## 2018-12-03 ENCOUNTER — Ambulatory Visit (INDEPENDENT_AMBULATORY_CARE_PROVIDER_SITE_OTHER): Payer: 59 | Admitting: Family Medicine

## 2018-12-03 DIAGNOSIS — I10 Essential (primary) hypertension: Secondary | ICD-10-CM

## 2018-12-03 DIAGNOSIS — E118 Type 2 diabetes mellitus with unspecified complications: Secondary | ICD-10-CM | POA: Diagnosis not present

## 2018-12-03 DIAGNOSIS — E1165 Type 2 diabetes mellitus with hyperglycemia: Secondary | ICD-10-CM

## 2018-12-03 DIAGNOSIS — Z794 Long term (current) use of insulin: Secondary | ICD-10-CM

## 2018-12-03 DIAGNOSIS — E785 Hyperlipidemia, unspecified: Secondary | ICD-10-CM | POA: Diagnosis not present

## 2018-12-03 DIAGNOSIS — IMO0002 Reserved for concepts with insufficient information to code with codable children: Secondary | ICD-10-CM

## 2018-12-03 NOTE — Telephone Encounter (Signed)
No need to fill this patient on different type of insulin thank you

## 2018-12-03 NOTE — Progress Notes (Signed)
Subjective:    Patient ID: Cheryl Middleton, female    DOB: 18-May-1965, 53 y.o.   MRN: [id number]  Diabetes She presents for her follow-up diabetic visit. She has type 2 diabetes mellitus. Pertinent negatives for hypoglycemia include no confusion or dizziness. Associated symptoms include polydipsia and polyuria. Pertinent negatives for diabetes include no chest pain, no fatigue, no polyphagia and no weakness. Exercise: walks every morning 15 -40 mins. Home blood sugar record trend: 89 - over 200  She does not see a podiatrist.Eye exam is current (has appt today).   Has physical scheduled with carolyn next month.  This patient states she is trying to do better with her diabetes.  States that has been running a little bit on the high side at times but she is uncertain if it is tremendously out of control she is trying much harder and her A1c recently is come down compared to where it was  Virtual Visit via Video Note  I connected with Cheryl Middleton on 12/03/18 at  2:00 PM EDT by a video enabled telemedicine application and verified that I am speaking with the correct person using two identifiers.  Location: Patient: home Provider: office   I discussed the limitations of evaluation and management by telemedicine and the availability of in person appointments. The patient expressed understanding and agreed to proceed.  History of Present Illness:    Observations/Objective:   Assessment and Plan:   Follow Up Instructions:    I discussed the assessment and treatment plan with the patient. The patient was provided an opportunity to ask questions and all were answered. The patient agreed with the plan and demonstrated an understanding of the instructions.   The patient was advised to call back or seek an in-person evaluation if the symptoms worsen or if the condition fails to improve as anticipated.  I provided 25 minutes of non-face-to-face time during this encounter.  25 minutes  was spent with the patient.  This statement verifies that 25 minutes was indeed spent with the patient.  More than 50% of this visit-total duration of the visit-was spent in counseling and coordination of care. The issues that the patient came in for today as reflected in the diagnosis (s) please refer to documentation for further details.  We did discuss dietary measures. I will send her additional information via my chart    Review of Systems  Constitutional: Negative for activity change, appetite change and fatigue.  HENT: Negative for congestion and rhinorrhea.   Respiratory: Negative for cough and shortness of breath.   Cardiovascular: Negative for chest pain and leg swelling.  Gastrointestinal: Negative for abdominal pain and diarrhea.  Endocrine: Positive for polydipsia and polyuria. Negative for polyphagia.  Skin: Negative for color change.  Neurological: Negative for dizziness and weakness.  Psychiatric/Behavioral: Negative for behavioral problems and confusion.       Objective:   Physical Exam Patient had virtual visit Appears to be in no distress Atraumatic Neuro able to relate and oriented No apparent resp distress Color normal        Assessment & Plan:  Diabetes subpar control patient will work harder to try to get her diet under better control activity and taking her medicine I will send her parameters what to watch for I encourage patient start checking her sugar 2-3 times per day over the course the next couple weeks we can look over those readings and see if she may need to benefit from a fast acting insulin  taking at her largest meals possibly lunch and dinner as well as a long-acting in the evening  Blood pressure reportedly good control continue current measures  Cholesterol decent control continue current measures watch diet stay physically active

## 2018-12-04 MED ORDER — GLUCOSE BLOOD VI STRP: ORAL_STRIP | 5 refills | Status: DC

## 2018-12-04 NOTE — Addendum Note (Signed)
Addended by: Carmelina Noun on: 12/04/2018 02:07 PM   Modules accepted: Orders

## 2018-12-05 ENCOUNTER — Other Ambulatory Visit: Payer: Self-pay

## 2018-12-05 ENCOUNTER — Ambulatory Visit
Admission: RE | Admit: 2018-12-05 | Discharge: 2018-12-05 | Disposition: A | Discharge status: Home / Self Care | Payer: 59 | Source: Ambulatory Visit | Attending: Family Medicine | Admitting: Family Medicine

## 2018-12-05 DIAGNOSIS — Z1231 Encounter for screening mammogram for malignant neoplasm of breast: Secondary | ICD-10-CM

## 2018-12-20 LAB — HM DIABETES EYE EXAM

## 2018-12-27 ENCOUNTER — Ambulatory Visit (INDEPENDENT_AMBULATORY_CARE_PROVIDER_SITE_OTHER): Payer: 59 | Admitting: Nurse Practitioner

## 2018-12-27 ENCOUNTER — Other Ambulatory Visit: Payer: Self-pay

## 2018-12-27 ENCOUNTER — Encounter: Payer: Self-pay | Admitting: Nurse Practitioner

## 2018-12-27 VITALS — BP 132/84 | Temp 96.9°F | Ht 62.0 in | Wt 169.8 lb

## 2018-12-27 DIAGNOSIS — Z01419 Encounter for gynecological examination (general) (routine) without abnormal findings: Secondary | ICD-10-CM

## 2018-12-27 NOTE — Progress Notes (Signed)
   Subjective:    Patient ID: Cheryl Middleton, female    DOB: [birth year], 52 y.o.   MRN: [id number]  HPI The patient comes in today for a wellness visit.    A review of their health history was completed.  A review of medications was also completed.  Any needed refills; not at this time; has automatic refills set up  Eating habits: tries to be healthy  Falls/  MVA accidents in past few months: none  Regular exercise: 20-30 min of walking daily   Specialist pt sees on regular basis: none  Preventative health issues were discussed.   Additional concerns:none   Review of Systems     Objective:   Physical Exam        Assessment & Plan:

## 2018-12-27 NOTE — Progress Notes (Signed)
Subjective:    Patient ID: Cheryl Middleton, female    DOB: January 18, 1966, 53 y.o.   MRN: [id number]  HPI The patient comes in today for a wellness visit. Pt has no specific concerns. Pt has cut off sodas and replaced them with water. Has decreased intake of fatty foods. Pt is walking 20-75mins a day. Pt has regular dental and eye exams. Pt has not been sexually active in the past 6 months. Pt reports no hypoglycemic events. Fasting blood sugars have been 70-150s. Pt sleeps 8 hours per day. Pt declines flu vaccine.    A review of their health history was completed.  A review of medications was also completed.  Any needed refills; not at this time; has automatic refills set up   Falls/  MVA accidents in past few months: none  Specialist pt sees on regular basis: none  Preventative health issues were discussed.   Drug/Alcohol: No history of tobacco use. Denies smoking or any illicit drugs  Additional concerns:none   Review of Systems  Constitutional: Negative for activity change, appetite change, chills and fever.  HENT: Negative for ear pain, sinus pain and sneezing.   Respiratory: Negative for cough, shortness of breath and wheezing.   Cardiovascular: Negative for chest pain, palpitations and leg swelling.  Gastrointestinal: Negative for abdominal pain, blood in stool, constipation, diarrhea and vomiting.  Genitourinary: Negative for difficulty urinating, dysuria, frequency, genital sores, vaginal bleeding and vaginal discharge.  Musculoskeletal: Negative for back pain and myalgias.  Neurological: Negative for dizziness, syncope, weakness and light-headedness.  Psychiatric/Behavioral: Negative for confusion and sleep disturbance.   Depression screen [facility] 2/9 12/27/2018 02/07/2018 09/12/2017 08/24/2016 07/14/2015  Decreased Interest 0 0 0 0 0  Down, Depressed, Hopeless 0 0 0 1 0  PHQ - 2 Score 0 0 0 1 0  Altered sleeping 0 - - - -  Tired, decreased energy 0 - - - -  Change in  appetite 0 - - - -  Feeling bad or failure about yourself  0 - - - -  Trouble concentrating 0 - - - -  Moving slowly or fidgety/restless 0 - - - -  Suicidal thoughts 0 - - - -  PHQ-9 Score 0 - - - -  Difficult doing work/chores Not difficult at all - - - -        Objective:   Physical Exam Exam conducted with a chaperone present.  Constitutional:      Appearance: Normal appearance.  Neck:     Thyroid: No thyroid mass or thyromegaly.  Cardiovascular:     Rate and Rhythm: Normal rate.     Heart sounds: No murmur.  Pulmonary:     Effort: No respiratory distress.     Breath sounds: Normal breath sounds. No stridor. No wheezing or rhonchi.  Chest:     Chest wall: No mass, lacerations, swelling or tenderness.     Breasts:        Right: Normal. No swelling, bleeding, inverted nipple or mass.        Left: Normal. No swelling, bleeding, inverted nipple or mass.  Abdominal:     General: Abdomen is flat.     Palpations: There is no mass.     Tenderness: There is no abdominal tenderness. There is no rebound.     Hernia: No hernia is present.  Genitourinary:    Exam position: Lithotomy position.     Comments: External: No rashes or lesion Internal: No rashes or lesion. No  CMT. Cervix pink. No discharge present. No masses or tenderness palpated on bimanual exam. Nonpalpable ovaries. Exam limited due to abdominal girth. Skin:    General: Skin is warm and dry.  Neurological:     Mental Status: She is alert and oriented to person, place, and time.  Psychiatric:        Mood and Affect: Mood normal.        Behavior: Behavior normal.    Diabetic Foot Exam - Simple   Simple Foot Form Visual Inspection No deformities, no ulcerations, no other skin breakdown bilaterally: Yes Sensation Testing Intact to touch and monofilament testing bilaterally: Yes Pulse Check Posterior Tibialis and Dorsalis pulse intact bilaterally: Yes Comments Some callused are present on heels bilaterally     Results for orders placed or performed in visit on 11/14/18  Lipid panel  Result Value Ref Range   Cholesterol, Total 137 100 - 199 mg/dL   Triglycerides 84 0 - 149 mg/dL   HDL 60 >39 mg/dL   VLDL Cholesterol Cal 16 5 - 40 mg/dL   LDL Chol Calc (NIH) 61 0 - 99 mg/dL   Chol/HDL Ratio 2.3 0.0 - 4.4 ratio  Hemoglobin A1c  Result Value Ref Range   Hgb A1c MFr Bld 9.6 (H) 4.8 - 5.6 %   Est. average glucose Bld gHb Est-mCnc 229 mg/dL  Basic metabolic panel  Result Value Ref Range   Glucose 148 (H) 65 - 99 mg/dL   BUN 13 6 - 24 mg/dL   Creatinine, Ser 0.79 0.57 - 1.00 mg/dL   GFR calc non Af Amer 87 >59 mL/min/1.73   GFR calc Af Amer 100 >59 mL/min/1.73   BUN/Creatinine Ratio 16 9 - 23   Sodium 141 134 - 144 mmol/L   Potassium 4.4 3.5 - 5.2 mmol/L   Chloride 100 96 - 106 mmol/L   CO2 24 20 - 29 mmol/L   Calcium 9.4 8.7 - 10.2 mg/dL  Microalbumin / creatinine urine ratio  Result Value Ref Range   Creatinine, Urine 33.9 Not Estab. mg/dL   Microalbumin, Urine 7.2 Not Estab. ug/mL   Microalb/Creat Ratio 21 0 - 29 mg/g creat          Assessment & Plan:   Problem List Items Addressed This Visit    None    Visit Diagnoses    Well woman exam    -  Primary     -Recommend continued lifestyle and dietary modifications. Labs reviewed with pt and all questions answered. Pt hemoglobin A1c decreasing with added Lantus at bedtime. Discussed the use of continuous glucose monitoring to mange diabetes. Pt will research and see about insurance coverage.   Return in about 3 months (around 03/29/2019) for Follow-up for diabetes.

## 2019-01-09 ENCOUNTER — Other Ambulatory Visit: Payer: Self-pay | Admitting: *Deleted

## 2019-01-09 MED ORDER — METFORMIN HCL 1000 MG PO TABS: ORAL_TABLET | ORAL | 5 refills | Status: DC

## 2019-03-12 ENCOUNTER — Telehealth: Payer: Self-pay | Admitting: Family Medicine

## 2019-03-12 MED ORDER — INSULIN GLARGINE 100 UNIT/ML ~~LOC~~ SOLN: 60.0000 [IU] | Freq: Every day | SUBCUTANEOUS | 3 refills | Status: DC

## 2019-03-12 NOTE — Telephone Encounter (Signed)
Refills sent to pharm and pt notifed she needs appt and transferred to front to schedule.

## 2019-03-12 NOTE — Telephone Encounter (Signed)
Wellness on 12/27/18

## 2019-03-12 NOTE — Telephone Encounter (Signed)
Pt needs refill on her insulin glargine (LANTUS) 100 UNIT/ML injection  [facility]   Needs today, pharmacy never sent [place] the refill request  Please call pt when done

## 2019-03-12 NOTE — Telephone Encounter (Signed)
3 refills patient does need to do virtual follow-up in January or February

## 2019-04-05 ENCOUNTER — Other Ambulatory Visit: Payer: Self-pay | Admitting: Family Medicine

## 2019-04-07 ENCOUNTER — Other Ambulatory Visit: Payer: Self-pay

## 2019-04-07 ENCOUNTER — Ambulatory Visit: Payer: 59 | Attending: Internal Medicine

## 2019-04-07 DIAGNOSIS — Z20822 Contact with and (suspected) exposure to covid-19: Secondary | ICD-10-CM

## 2019-04-08 ENCOUNTER — Telehealth: Payer: Self-pay | Admitting: Family Medicine

## 2019-04-08 LAB — NOVEL CORONAVIRUS, NAA: SARS-CoV-2, NAA: NOT DETECTED

## 2019-04-08 NOTE — Telephone Encounter (Signed)
Patient was using manufacturer coupon for 2020 for a  copay of $35. Gave patient the number to call for the new manufacturer coupon for 2021 and call us back if she has any problems

## 2019-04-08 NOTE — Telephone Encounter (Signed)
Pt is needing a new medication or PA done for empagliflozin (JARDIANCE) 10 MG TABS tablet   She went to pick up medication at the pharmacy and it was going to cost $419.   [id number] [place] [place] [facility] [address]

## 2019-04-10 ENCOUNTER — Telehealth: Payer: Self-pay | Admitting: Family Medicine

## 2019-04-10 NOTE — Telephone Encounter (Signed)
[facility] requesting refill on Losartan Potassium 25 mg tablet take one tablet po every day. Pt last seen 12/27/2018 for well women exam. Please advise. Thank you.

## 2019-04-10 NOTE — Telephone Encounter (Signed)
May have 90-day needs follow-up virtual visit in April

## 2019-04-11 MED ORDER — LOSARTAN POTASSIUM 25 MG PO TABS: 25.0000 mg | ORAL_TABLET | Freq: Every day | ORAL | 0 refills | Status: DC

## 2019-04-11 NOTE — Addendum Note (Signed)
Addended by: Dairl Ponder on: 04/11/2019 08:44 AM   Modules accepted: Orders

## 2019-04-15 ENCOUNTER — Telehealth: Payer: Self-pay | Admitting: Family Medicine

## 2019-04-15 NOTE — Telephone Encounter (Signed)
PA completed for patients Jardiance 10 mg tablet. Awaiting decision

## 2019-04-16 NOTE — Telephone Encounter (Signed)
Jardiance 10 mg tablets have been approved through 04/14/2020. Pt has been contacted and is aware

## 2019-04-27 ENCOUNTER — Telehealth: Payer: Self-pay | Admitting: Family Medicine

## 2019-04-27 DIAGNOSIS — E119 Type 2 diabetes mellitus without complications: Secondary | ICD-10-CM

## 2019-04-27 DIAGNOSIS — I10 Essential (primary) hypertension: Secondary | ICD-10-CM

## 2019-04-27 DIAGNOSIS — Z79899 Other long term (current) drug therapy: Secondary | ICD-10-CM

## 2019-04-27 DIAGNOSIS — E785 Hyperlipidemia, unspecified: Secondary | ICD-10-CM

## 2019-04-27 NOTE — Telephone Encounter (Signed)
This patient is due for office visit in March She will need to do 123456, lipid, metabolic 7, liver\ Very important patient does lab work before her visit The visit can be virtual Diagnosis hyperlipidemia, diabetes

## 2019-04-28 NOTE — Addendum Note (Signed)
Addended by: Dairl Ponder on: 04/28/2019 09:31 AM   Modules accepted: Orders

## 2019-04-28 NOTE — Telephone Encounter (Signed)
Left message to return call (Blood work ordered in Epic) 

## 2019-04-28 NOTE — Telephone Encounter (Signed)
Patient scheduled follow up office visit and was notified about blood work.

## 2019-05-03 LAB — BASIC METABOLIC PANEL
BUN/Creatinine Ratio: 16 (ref 9–23)
BUN: 12 mg/dL (ref 6–24)
CO2: 22 mmol/L (ref 20–29)
Calcium: 9.2 mg/dL (ref 8.7–10.2)
Chloride: 105 mmol/L (ref 96–106)
Creatinine, Ser: 0.73 mg/dL (ref 0.57–1.00)
GFR calc Af Amer: 110 mL/min/{1.73_m2} (ref >59)
GFR calc non Af Amer: 95 mL/min/{1.73_m2} (ref >59)
Glucose: 66 mg/dL (ref 65–99)
Potassium: 5.3 mmol/L — ABNORMAL HIGH (ref 3.5–5.2)
Sodium: 144 mmol/L (ref 134–144)

## 2019-05-03 LAB — HEPATIC FUNCTION PANEL
ALT: 11 IU/L (ref 0–32)
AST: 14 IU/L (ref 0–40)
Albumin: 4.3 g/dL (ref 3.8–4.9)
Alkaline Phosphatase: 108 IU/L (ref 39–117)
Bilirubin Total: 0.2 mg/dL (ref 0.0–1.2)
Bilirubin, Direct: 0.08 mg/dL (ref 0.00–0.40)
Total Protein: 7.4 g/dL (ref 6.0–8.5)

## 2019-05-03 LAB — LIPID PANEL
Chol/HDL Ratio: 2.2 ratio (ref 0.0–4.4)
Cholesterol, Total: 148 mg/dL (ref 100–199)
HDL: 68 mg/dL (ref >39)
LDL Chol Calc (NIH): 66 mg/dL (ref 0–99)
Triglycerides: 74 mg/dL (ref 0–149)
VLDL Cholesterol Cal: 14 mg/dL (ref 5–40)

## 2019-05-03 LAB — HEMOGLOBIN A1C
Est. average glucose Bld gHb Est-mCnc: 298 mg/dL
Hgb A1c MFr Bld: 12 % — ABNORMAL HIGH (ref 4.8–5.6)

## 2019-05-08 ENCOUNTER — Ambulatory Visit: Payer: 59 | Admitting: Family Medicine

## 2019-05-13 ENCOUNTER — Ambulatory Visit (INDEPENDENT_AMBULATORY_CARE_PROVIDER_SITE_OTHER): Payer: 59 | Admitting: Family Medicine

## 2019-05-13 ENCOUNTER — Other Ambulatory Visit: Payer: Self-pay | Admitting: *Deleted

## 2019-05-13 ENCOUNTER — Other Ambulatory Visit: Payer: Self-pay | Admitting: Family Medicine

## 2019-05-13 DIAGNOSIS — I10 Essential (primary) hypertension: Secondary | ICD-10-CM

## 2019-05-13 DIAGNOSIS — E559 Vitamin D deficiency, unspecified: Secondary | ICD-10-CM | POA: Diagnosis not present

## 2019-05-13 DIAGNOSIS — E119 Type 2 diabetes mellitus without complications: Secondary | ICD-10-CM

## 2019-05-13 DIAGNOSIS — E785 Hyperlipidemia, unspecified: Secondary | ICD-10-CM

## 2019-05-13 DIAGNOSIS — E118 Type 2 diabetes mellitus with unspecified complications: Secondary | ICD-10-CM | POA: Diagnosis not present

## 2019-05-13 DIAGNOSIS — Z794 Long term (current) use of insulin: Secondary | ICD-10-CM

## 2019-05-13 DIAGNOSIS — E1165 Type 2 diabetes mellitus with hyperglycemia: Secondary | ICD-10-CM

## 2019-05-13 DIAGNOSIS — IMO0002 Reserved for concepts with insufficient information to code with codable children: Secondary | ICD-10-CM

## 2019-05-13 MED ORDER — VICTOZA 18 MG/3ML ~~LOC~~ SOPN: PEN_INJECTOR | SUBCUTANEOUS | 5 refills | Status: DC

## 2019-05-13 MED ORDER — INSULIN GLARGINE 100 UNIT/ML ~~LOC~~ SOLN: 60.0000 [IU] | Freq: Every day | SUBCUTANEOUS | 3 refills | Status: DC

## 2019-05-13 MED ORDER — METFORMIN HCL 1000 MG PO TABS: ORAL_TABLET | ORAL | 5 refills | Status: DC

## 2019-05-13 MED ORDER — ROSUVASTATIN CALCIUM 20 MG PO TABS: 20.0000 mg | ORAL_TABLET | Freq: Every day | ORAL | 1 refills | Status: DC

## 2019-05-13 MED ORDER — LOSARTAN POTASSIUM 25 MG PO TABS: 25.0000 mg | ORAL_TABLET | Freq: Every day | ORAL | 1 refills | Status: DC

## 2019-05-13 NOTE — Progress Notes (Signed)
Subjective:    Patient ID: Cheryl Middleton, female    DOB: [birth year], 54 y.o.   MRN: [id number]  Diabetes She presents for her follow-up diabetic visit. She has type 2 diabetes mellitus. There are no hypoglycemic associated symptoms. Pertinent negatives for hypoglycemia include no confusion or dizziness. There are no diabetic associated symptoms. Pertinent negatives for diabetes include no chest pain, no fatigue, no polydipsia, no polyphagia and no weakness. There are no hypoglycemic complications. There are no diabetic complications. Risk factors for coronary artery disease include hypertension. She participates in exercise intermittently. She does not see a podiatrist.Eye exam is current.  Hypertension This is a chronic problem. Pertinent negatives include no chest pain or shortness of breath.  Essential hypertension, benign  Uncontrolled type 2 diabetes mellitus with complication, with long-term current use of insulin (HCC)  Hyperlipidemia, unspecified hyperlipidemia type  Vitamin D deficiency  Patient with a history of vitamin D deficiency currently taking vitamin D 1000 units daily Also has diabetes states she had about 2 weeks where she could not get her medicine but now she is get the medicine states her readings are actually doing fairly well but she states they were doing better on Victoza she wonders if her new insurance will help cover that she is not seeing much effect from the [place] She does take the losartan for proteinuria and keeps her blood pressure under good control watches her diet tries to stay active   Pt states that she was out of her med for about 2 weeks due to insurance changes and miscommunication from provider to pharmacy.  Virtual Visit via Video Note  I connected with Cheryl Middleton on 05/13/19 at  8:30 AM EST by a video enabled telemedicine application and verified that I am speaking with the correct person using two  identifiers.  Location: Patient: home Provider: office   I discussed the limitations of evaluation and management by telemedicine and the availability of in person appointments. The patient expressed understanding and agreed to proceed.  History of Present Illness:    Observations/Objective:   Assessment and Plan:   Follow Up Instructions:    I discussed the assessment and treatment plan with the patient. The patient was provided an opportunity to ask questions and all were answered. The patient agreed with the plan and demonstrated an understanding of the instructions.   The patient was advised to call back or seek an in-person evaluation if the symptoms worsen or if the condition fails to improve as anticipated.  I provided 30 minutes of non-face-to-face time during this encounter. Time spent reviewing her chart documenting as well as interaction with the patient.  Decision making      Review of Systems  Constitutional: Negative for activity change, appetite change and fatigue.  HENT: Negative for congestion and rhinorrhea.   Respiratory: Negative for cough and shortness of breath.   Cardiovascular: Negative for chest pain and leg swelling.  Gastrointestinal: Negative for abdominal pain and diarrhea.  Endocrine: Negative for polydipsia and polyphagia.  Skin: Negative for color change.  Neurological: Negative for dizziness and weakness.  Psychiatric/Behavioral: Negative for behavioral problems and confusion.       Objective:   Physical Exam  Patient had virtual visit Appears to be in no distress Atraumatic Neuro able to relate and oriented No apparent resp distress Color normal       Assessment & Plan:  1. Essential hypertension, benign Blood pressure good control continue current measures minimize salt  2.  Uncontrolled type 2 diabetes mellitus with complication, with long-term current use of insulin (HCC) Diabetes not been under good control A1c is up  she states her readings are doing better now she will monitor those over the next couple weeks and send [place] some readings for review Stop [employer] If this is not covered we will find out which one is Continue the insulin long-acting may have to add short acting with meals depending on the numbers Continue the Metformin 1000 mg daily  3. Hyperlipidemia, unspecified hyperlipidemia type Cholesterol under very good control continue current measures stay active watch diet take medication  4. Vitamin D deficiency Will follow this closely continue the supplementation she will need to do lab work again later in the spring.

## 2019-05-13 NOTE — Progress Notes (Signed)
Orders put in and mailed to pt with a note to do in 3 -4 months

## 2019-05-20 ENCOUNTER — Ambulatory Visit: Payer: Self-pay | Attending: Internal Medicine

## 2019-05-20 DIAGNOSIS — Z23 Encounter for immunization: Secondary | ICD-10-CM | POA: Insufficient documentation

## 2019-05-20 NOTE — Progress Notes (Signed)
   Covid-19 Vaccination Clinic  Name:  Cheryl Middleton    MRN: [id number] DOB: 1966-02-03  05/20/2019  [name] was observed post Covid-19 immunization for 15 minutes without incident. She was provided with Vaccine Information Sheet and instruction to access the V-Safe system.   [name] was instructed to call 911 with any severe reactions post vaccine: Marland Kitchen Difficulty breathing  . Swelling of face and throat  . A fast heartbeat  . A bad rash all over body  . Dizziness and weakness   Immunizations Administered    Name Date Dose VIS Date Route   Moderna COVID-19 Vaccine 05/20/2019 12:08 PM 0.5 mL 02/18/2019 Intramuscular   Manufacturer: Moderna   Lot: [id number]   [id number]

## 2019-05-22 ENCOUNTER — Encounter: Payer: Self-pay | Admitting: Family Medicine

## 2019-05-23 NOTE — Telephone Encounter (Signed)
Patient states her insurance changed this month and for some reason it is not paying anything on any of her meds. Patient states she is going to call the insurance company at lunch and speak with them and then speak with the pharmacy to see what she needs to do because she can not afford any of her meds at full cash price. Patient stated she will let us know if there is anything we can do to help her

## 2019-05-27 ENCOUNTER — Other Ambulatory Visit: Payer: Self-pay | Admitting: Family Medicine

## 2019-05-27 ENCOUNTER — Encounter: Payer: Self-pay | Admitting: Family Medicine

## 2019-05-28 NOTE — Telephone Encounter (Signed)
Nurses If there is a delay in getting Victoza is only a few days that is not a big deal if for some reason it is totally denied then we would need to reinstitute the Jardiance until we could find a suitable substitute  If it is denied ideally we need to find out from [employer] with a cover a similar product or have a preferred product in place of Victoza Thanks-Dr. Nicki Reaper

## 2019-05-28 NOTE — Telephone Encounter (Signed)
Await decision from insurance company

## 2019-05-29 NOTE — Telephone Encounter (Signed)
Victoza has been denied by insurance. Fax from [place] states that Victoza is only covered if patient has a history of suboptimal response, contraindications or intolerance to metformin. Please advise. Thank you

## 2019-06-03 ENCOUNTER — Encounter: Payer: Self-pay | Admitting: Family Medicine

## 2019-06-13 ENCOUNTER — Encounter: Payer: Self-pay | Admitting: Family Medicine

## 2019-06-17 ENCOUNTER — Ambulatory Visit: Payer: Self-pay | Attending: Internal Medicine

## 2019-06-17 DIAGNOSIS — Z23 Encounter for immunization: Secondary | ICD-10-CM

## 2019-06-17 NOTE — Progress Notes (Signed)
   Covid-19 Vaccination Clinic  Name:  Cheryl Middleton    MRN: [id number] DOB: 1965/12/26  06/17/2019  [name] was observed post Covid-19 immunization for 15 minutes without incident. She was provided with Vaccine Information Sheet and instruction to access the V-Safe system.   [name] was instructed to call 911 with any severe reactions post vaccine: Marland Kitchen Difficulty breathing  . Swelling of face and throat  . A fast heartbeat  . A bad rash all over body  . Dizziness and weakness   Immunizations Administered    Name Date Dose VIS Date Route   Moderna COVID-19 Vaccine 06/17/2019 12:13 PM 0.5 mL 02/18/2019 Intramuscular   Manufacturer: Moderna   Lot: [id number]   [id number]

## 2019-07-01 MED ORDER — VICTOZA 18 MG/3ML ~~LOC~~ SOPN: PEN_INJECTOR | SUBCUTANEOUS | 5 refills | Status: DC

## 2019-07-01 NOTE — Telephone Encounter (Signed)
Nurses Looking into this matter more closely it is rather frustrating that they would reject this request  The patient is already on maximum dose Metformin.  Her A1c is 12 Therefore I would consider that to be a inadequate response to the current regimen. Please read try to send in Victoza 1.2 mg subcu daily My understanding this is on the approved list for medications If not hopefully there are other similar medicines that they could suggest to [place] If there is a form that needs to be filled out for the appeal please fill it out to the best of your ability and allow me to look over the form thank you

## 2019-07-01 NOTE — Addendum Note (Signed)
Addended by: Vicente Males on: 07/01/2019 01:22 PM   Modules accepted: Orders

## 2019-07-01 NOTE — Telephone Encounter (Signed)
Victoza sent to pharmacy. Mychart message sent to patient.

## 2019-07-16 ENCOUNTER — Other Ambulatory Visit: Payer: Self-pay | Admitting: Family Medicine

## 2019-07-21 ENCOUNTER — Telehealth: Payer: Self-pay | Admitting: Family Medicine

## 2019-07-21 MED ORDER — ROSUVASTATIN CALCIUM 20 MG PO TABS: 20.0000 mg | ORAL_TABLET | Freq: Every day | ORAL | 0 refills | Status: DC

## 2019-07-21 NOTE — Telephone Encounter (Signed)
Med refilled.

## 2019-07-21 NOTE — Telephone Encounter (Signed)
Patient is requesting refill on rosuvastatin 20 mg. [facility] [place]

## 2019-10-08 ENCOUNTER — Other Ambulatory Visit: Payer: Self-pay | Admitting: Family Medicine

## 2019-10-08 NOTE — Telephone Encounter (Signed)
LVM to schedule appt

## 2019-10-12 ENCOUNTER — Encounter: Payer: Self-pay | Admitting: Nurse Practitioner

## 2019-10-13 ENCOUNTER — Other Ambulatory Visit: Payer: Self-pay | Admitting: *Deleted

## 2019-10-13 MED ORDER — INSULIN GLARGINE 100 UNIT/ML ~~LOC~~ SOLN: 60.0000 [IU] | Freq: Every day | SUBCUTANEOUS | 0 refills | Status: DC

## 2019-10-13 NOTE — Telephone Encounter (Signed)
Scheduled 8/24 pt made aware of labs being ordered.

## 2019-10-29 ENCOUNTER — Other Ambulatory Visit: Payer: Self-pay

## 2019-10-29 ENCOUNTER — Encounter: Payer: Self-pay | Admitting: Family Medicine

## 2019-10-29 MED ORDER — INSULIN GLARGINE 100 UNIT/ML ~~LOC~~ SOLN: 60.0000 [IU] | Freq: Every day | SUBCUTANEOUS | 0 refills | Status: DC

## 2019-11-08 LAB — BASIC METABOLIC PANEL
BUN/Creatinine Ratio: 8 — ABNORMAL LOW (ref 9–23)
BUN: 7 mg/dL (ref 6–24)
CO2: 24 mmol/L (ref 20–29)
Calcium: 9.6 mg/dL (ref 8.7–10.2)
Chloride: 104 mmol/L (ref 96–106)
Creatinine, Ser: 0.85 mg/dL (ref 0.57–1.00)
GFR calc Af Amer: 91 mL/min/{1.73_m2} (ref >59)
GFR calc non Af Amer: 79 mL/min/{1.73_m2} (ref >59)
Glucose: 72 mg/dL (ref 65–99)
Potassium: 4.9 mmol/L (ref 3.5–5.2)
Sodium: 143 mmol/L (ref 134–144)

## 2019-11-08 LAB — HEMOGLOBIN A1C
Est. average glucose Bld gHb Est-mCnc: 209 mg/dL
Hgb A1c MFr Bld: 8.9 % — ABNORMAL HIGH (ref 4.8–5.6)

## 2019-11-11 ENCOUNTER — Encounter: Payer: Self-pay | Admitting: Family Medicine

## 2019-11-11 ENCOUNTER — Other Ambulatory Visit: Payer: Self-pay

## 2019-11-11 ENCOUNTER — Ambulatory Visit (INDEPENDENT_AMBULATORY_CARE_PROVIDER_SITE_OTHER): Payer: 59 | Admitting: Family Medicine

## 2019-11-11 VITALS — BP 116/74 | HR 102 | Temp 97.4°F | Ht 62.0 in | Wt 173.0 lb

## 2019-11-11 DIAGNOSIS — I10 Essential (primary) hypertension: Secondary | ICD-10-CM | POA: Diagnosis not present

## 2019-11-11 DIAGNOSIS — Z79899 Other long term (current) drug therapy: Secondary | ICD-10-CM

## 2019-11-11 DIAGNOSIS — E785 Hyperlipidemia, unspecified: Secondary | ICD-10-CM

## 2019-11-11 DIAGNOSIS — E1169 Type 2 diabetes mellitus with other specified complication: Secondary | ICD-10-CM | POA: Diagnosis not present

## 2019-11-11 DIAGNOSIS — E118 Type 2 diabetes mellitus with unspecified complications: Secondary | ICD-10-CM

## 2019-11-11 DIAGNOSIS — E1165 Type 2 diabetes mellitus with hyperglycemia: Secondary | ICD-10-CM

## 2019-11-11 DIAGNOSIS — Z794 Long term (current) use of insulin: Secondary | ICD-10-CM

## 2019-11-11 DIAGNOSIS — IMO0002 Reserved for concepts with insufficient information to code with codable children: Secondary | ICD-10-CM

## 2019-11-11 MED ORDER — INSULIN GLARGINE 100 UNIT/ML ~~LOC~~ SOLN
60.0000 [IU] | Freq: Every day | SUBCUTANEOUS | 5 refills | Status: DC
Start: 2019-11-11 — End: 2020-02-16

## 2019-11-11 MED ORDER — ROSUVASTATIN CALCIUM 20 MG PO TABS
20.0000 mg | ORAL_TABLET | Freq: Every day | ORAL | 1 refills | Status: DC
Start: 2019-11-11 — End: 2020-04-26

## 2019-11-11 MED ORDER — LOSARTAN POTASSIUM 25 MG PO TABS
25.0000 mg | ORAL_TABLET | Freq: Every day | ORAL | 1 refills | Status: DC
Start: 2019-11-11 — End: 2020-09-21

## 2019-11-11 MED ORDER — METFORMIN HCL 1000 MG PO TABS: ORAL_TABLET | ORAL | 1 refills | Status: DC

## 2019-11-11 MED ORDER — VICTOZA 18 MG/3ML ~~LOC~~ SOPN: PEN_INJECTOR | SUBCUTANEOUS | 5 refills | Status: DC

## 2019-11-11 NOTE — Patient Instructions (Signed)

## 2019-11-11 NOTE — Progress Notes (Signed)
   Subjective:    Patient ID: Cheryl Middleton, female    DOB: 1966-02-10, 54 y.o.   MRN: [id number]  Diabetes She presents for her follow-up diabetic visit. She has type 2 diabetes mellitus. Pertinent negatives for hypoglycemia include no confusion or dizziness. Pertinent negatives for diabetes include no chest pain, no fatigue, no polydipsia, no polyphagia and no weakness. Current diabetic treatments: metformin, victoza, lantus. She is compliant with treatment all of the time. She rarely participates in exercise. Home blood sugar record trend: numbers are improving since february. She does not see a podiatrist.Eye exam is current.   Patient for blood pressure check up.  The patient does have hypertension.  The patient is on medication.  Patient relates compliance with meds. Todays BP reviewed with the patient. Patient denies issues with medication. Patient relates reasonable diet. Patient tries to minimize salt. Patient aware of BP goals.  Patient here for follow-up regarding cholesterol.  The patient does have hyperlipidemia.  Patient does try to maintain a reasonable diet.  Patient does take the medication on a regular basis.  Denies missing a dose.  The patient denies any obvious side effects.  Prior blood work results reviewed with the patient.  The patient is aware of his cholesterol goals and the need to keep it under good control to lessen the risk of disease.    Review of Systems  Constitutional: Negative for activity change, appetite change and fatigue.  HENT: Negative for congestion and rhinorrhea.   Respiratory: Negative for cough and shortness of breath.   Cardiovascular: Negative for chest pain and leg swelling.  Gastrointestinal: Negative for abdominal pain and diarrhea.  Endocrine: Negative for polydipsia and polyphagia.  Skin: Negative for color change.  Neurological: Negative for dizziness and weakness.  Psychiatric/Behavioral: Negative for behavioral problems and confusion.         Objective:   Physical Exam Vitals reviewed.  Constitutional:      General: She is not in acute distress. HENT:     Head: Normocephalic and atraumatic.  Eyes:     General:        Right eye: No discharge.        Left eye: No discharge.  Neck:     Trachea: No tracheal deviation.  Cardiovascular:     Rate and Rhythm: Normal rate and regular rhythm.     Heart sounds: Normal heart sounds. No murmur heard.   Pulmonary:     Effort: Pulmonary effort is normal. No respiratory distress.     Breath sounds: Normal breath sounds.  Lymphadenopathy:     Cervical: No cervical adenopathy.  Skin:    General: Skin is warm and dry.  Neurological:     Mental Status: She is alert.     Coordination: Coordination normal.  Psychiatric:        Behavior: Behavior normal.    Diabetic foot exam normal       Assessment & Plan:  Diabetes subpar control bump up dose of Victoza patient will monitor her sugars more closely and send [place] readings on a regular basis.  She will do follow-up lab work again in 3 to 4 months with a follow-up office visit within 4 to 6 months  Blood pressure good control continue current measures  Mild obesity patient is working hard at trying to lose weight  Hyperlipidemia under good control continue current measures

## 2019-12-18 ENCOUNTER — Other Ambulatory Visit (HOSPITAL_COMMUNITY): Payer: Self-pay | Admitting: Family Medicine

## 2019-12-18 DIAGNOSIS — Z1231 Encounter for screening mammogram for malignant neoplasm of breast: Secondary | ICD-10-CM

## 2020-01-05 ENCOUNTER — Other Ambulatory Visit: Payer: Self-pay

## 2020-01-05 ENCOUNTER — Ambulatory Visit (HOSPITAL_COMMUNITY)
Admission: RE | Admit: 2020-01-05 | Discharge: 2020-01-05 | Disposition: A | Discharge status: Home / Self Care | Payer: 59 | Source: Ambulatory Visit | Attending: Family Medicine | Admitting: Family Medicine

## 2020-01-05 DIAGNOSIS — Z1231 Encounter for screening mammogram for malignant neoplasm of breast: Secondary | ICD-10-CM | POA: Diagnosis not present

## 2020-02-14 ENCOUNTER — Other Ambulatory Visit: Payer: Self-pay | Admitting: Family Medicine

## 2020-03-09 ENCOUNTER — Other Ambulatory Visit: Payer: 59

## 2020-03-09 ENCOUNTER — Other Ambulatory Visit: Payer: Self-pay

## 2020-03-09 DIAGNOSIS — Z20822 Contact with and (suspected) exposure to covid-19: Secondary | ICD-10-CM

## 2020-03-12 LAB — HEPATIC FUNCTION PANEL
ALT: 12 IU/L (ref 0–32)
AST: 13 IU/L (ref 0–40)
Albumin: 4.3 g/dL (ref 3.8–4.9)
Alkaline Phosphatase: 128 IU/L — ABNORMAL HIGH (ref 44–121)
Bilirubin Total: <0.2 mg/dL (ref 0.0–1.2)
Bilirubin, Direct: <0.1 mg/dL (ref 0.00–0.40)
Total Protein: 7.4 g/dL (ref 6.0–8.5)

## 2020-03-12 LAB — BASIC METABOLIC PANEL
BUN/Creatinine Ratio: 16 (ref 9–23)
BUN: 12 mg/dL (ref 6–24)
CO2: 24 mmol/L (ref 20–29)
Calcium: 9.8 mg/dL (ref 8.7–10.2)
Chloride: 102 mmol/L (ref 96–106)
Creatinine, Ser: 0.76 mg/dL (ref 0.57–1.00)
GFR calc Af Amer: 104 mL/min/{1.73_m2} (ref >59)
GFR calc non Af Amer: 90 mL/min/{1.73_m2} (ref >59)
Glucose: 101 mg/dL — ABNORMAL HIGH (ref 65–99)
Potassium: 4.9 mmol/L (ref 3.5–5.2)
Sodium: 142 mmol/L (ref 134–144)

## 2020-03-12 LAB — HEMOGLOBIN A1C
Est. average glucose Bld gHb Est-mCnc: 229 mg/dL
Hgb A1c MFr Bld: 9.6 % — ABNORMAL HIGH (ref 4.8–5.6)

## 2020-03-12 LAB — MICROALBUMIN / CREATININE URINE RATIO
Creatinine, Urine: 103.8 mg/dL
Microalb/Creat Ratio: 16 mg/g creat (ref 0–29)
Microalbumin, Urine: 16.1 ug/mL

## 2020-03-12 LAB — LIPID PANEL
Chol/HDL Ratio: 2.8 ratio (ref 0.0–4.4)
Cholesterol, Total: 160 mg/dL (ref 100–199)
HDL: 57 mg/dL (ref >39)
LDL Chol Calc (NIH): 82 mg/dL (ref 0–99)
Triglycerides: 116 mg/dL (ref 0–149)
VLDL Cholesterol Cal: 21 mg/dL (ref 5–40)

## 2020-03-12 LAB — NOVEL CORONAVIRUS, NAA: SARS-CoV-2, NAA: NOT DETECTED

## 2020-03-12 LAB — SPECIMEN STATUS REPORT

## 2020-03-17 ENCOUNTER — Other Ambulatory Visit: Payer: Self-pay | Admitting: Family Medicine

## 2020-03-17 DIAGNOSIS — IMO0002 Reserved for concepts with insufficient information to code with codable children: Secondary | ICD-10-CM

## 2020-03-18 ENCOUNTER — Telehealth: Payer: Self-pay | Admitting: Family Medicine

## 2020-03-18 ENCOUNTER — Other Ambulatory Visit: Payer: Self-pay | Admitting: Family Medicine

## 2020-04-12 ENCOUNTER — Other Ambulatory Visit: Payer: Self-pay | Admitting: Family Medicine

## 2020-04-12 MED ORDER — INSULIN GLARGINE 100 UNIT/ML ~~LOC~~ SOLN
SUBCUTANEOUS | 0 refills | Status: DC
Start: 2020-04-12 — End: 2020-04-13

## 2020-04-12 NOTE — Addendum Note (Signed)
Addended by: Hilary Milks W on: 04/12/2020 03:33 PM   Modules accepted: Orders  

## 2020-04-13 ENCOUNTER — Encounter: Payer: Self-pay | Admitting: Family Medicine

## 2020-04-25 ENCOUNTER — Encounter: Payer: Self-pay | Admitting: Family Medicine

## 2020-04-26 ENCOUNTER — Other Ambulatory Visit: Payer: Self-pay

## 2020-04-26 IMAGING — DX DG SHOULDER 2+V*L*
3 series · 3 of 3 positions shown · non-contrast
Comparison: None

CLINICAL DATA: Fell in the shower is morning, LEFT shoulder pain

EXAM:
LEFT SHOULDER - 2+ VIEW

[shoulder grashey]
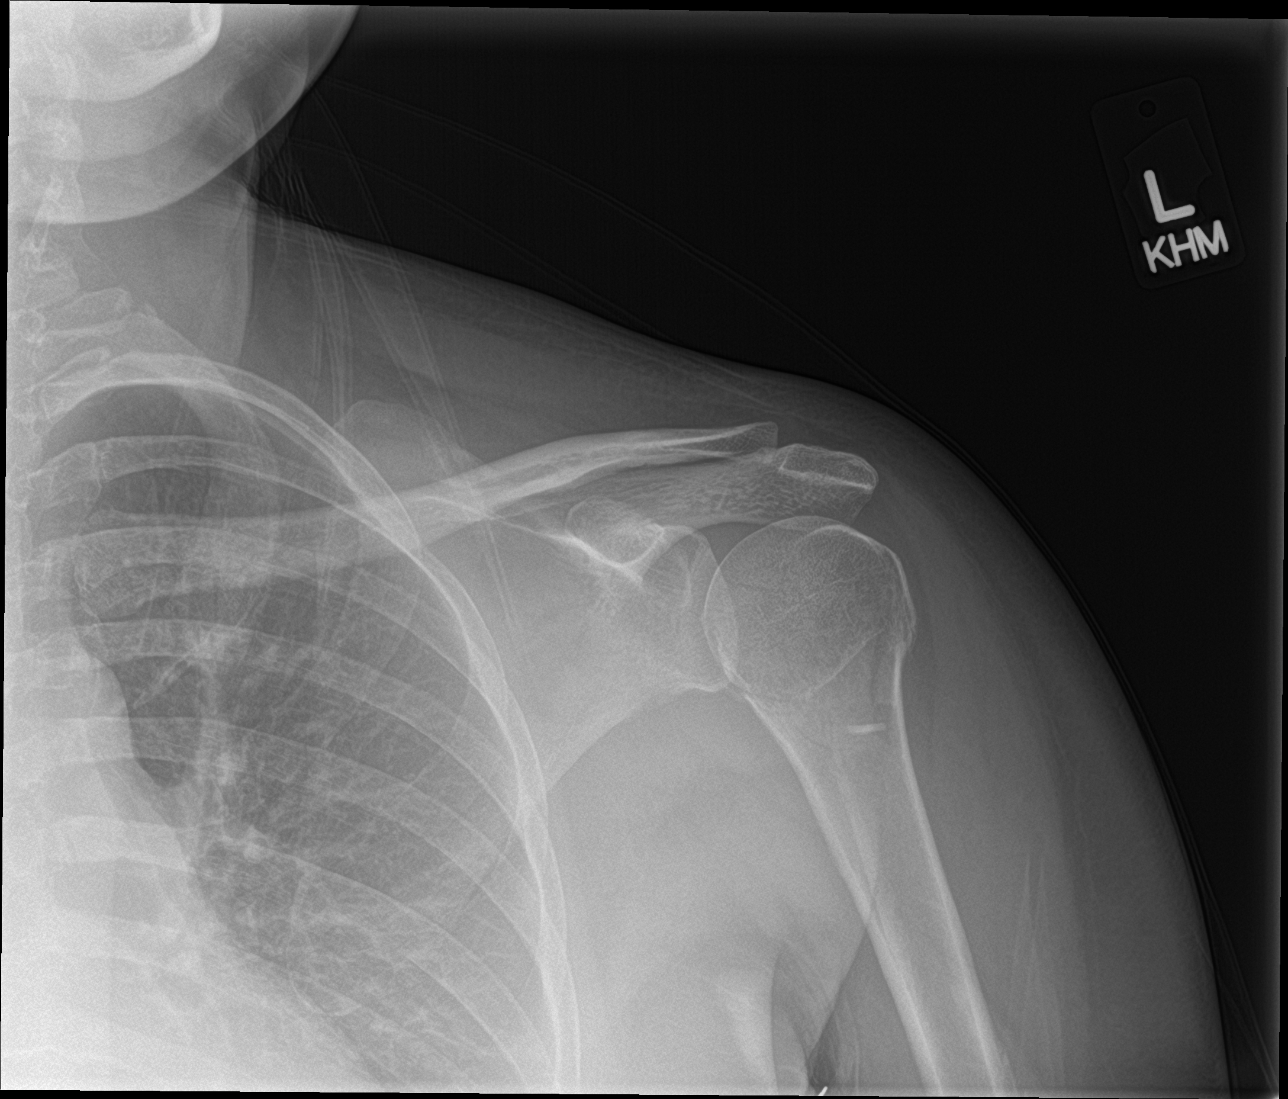

[shoulder y view]
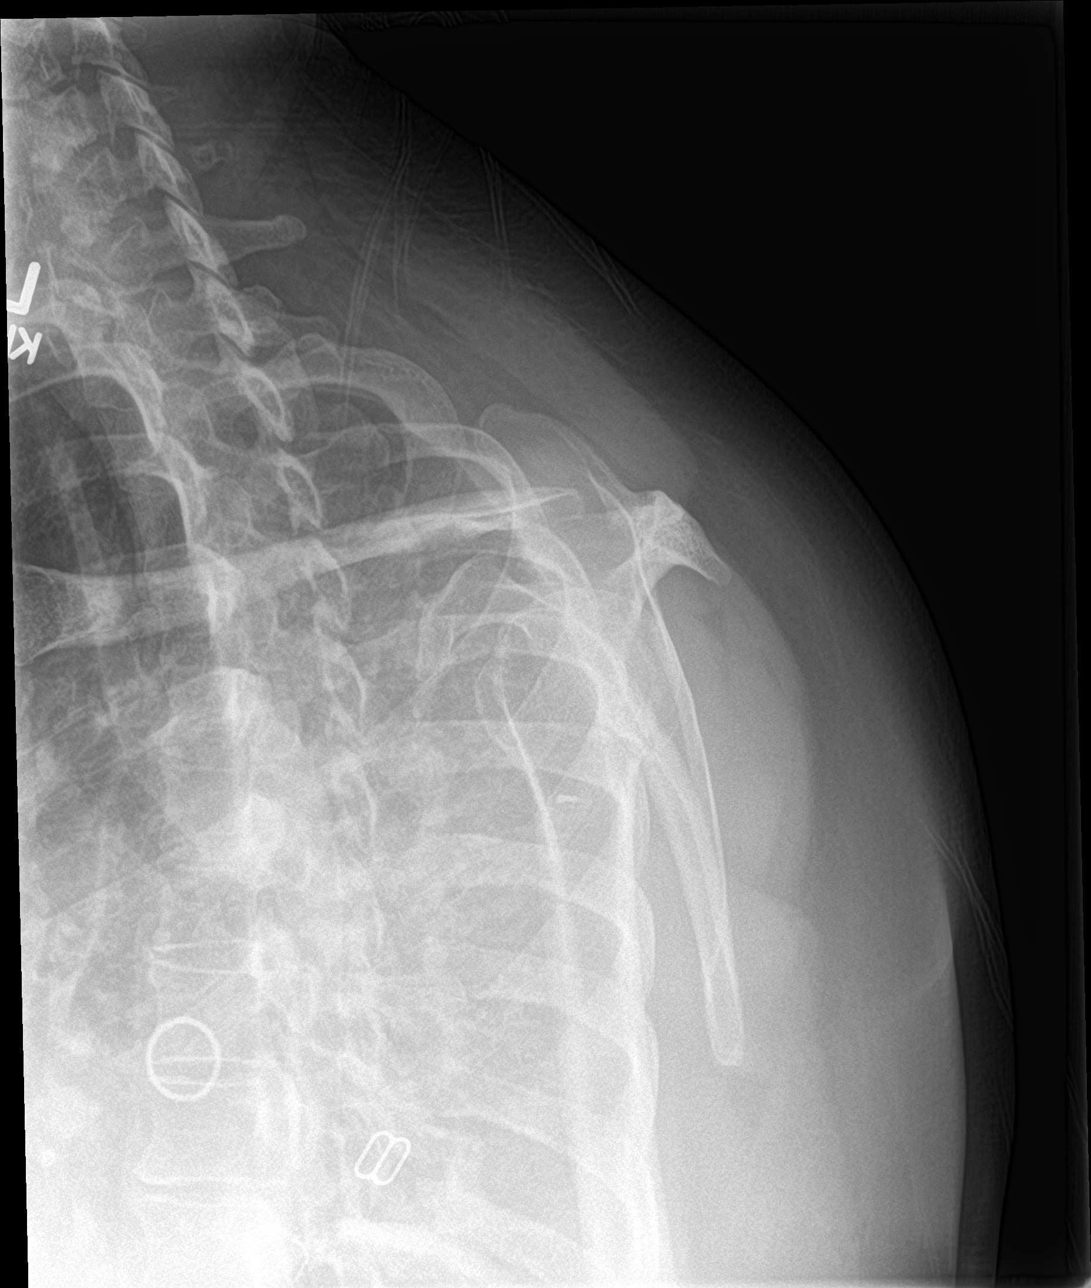

[shoulder axillary]
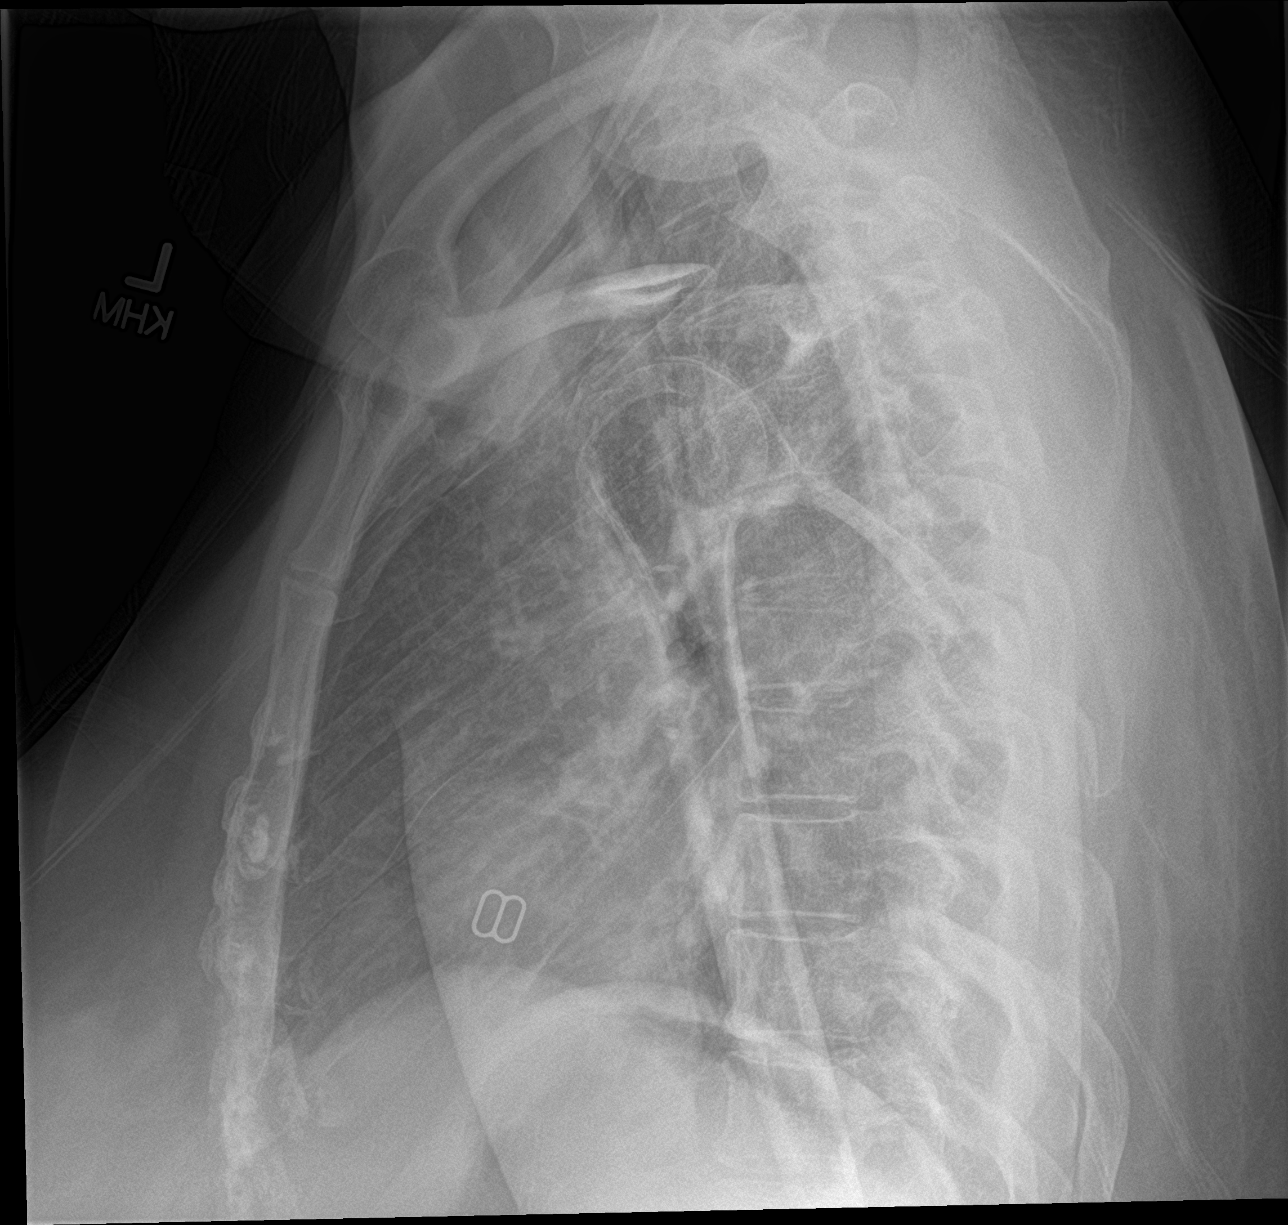

[3 of 3 positions shown; findings below may reference images not displayed]

FINDINGS: Mild osseous demineralization.

AC joint alignment normal.

Nondisplaced fracture at surgical neck LEFT humerus, likely
extending into greater tuberosity.

No additional fracture, dislocation or bone destruction.

Visualized LEFT ribs intact.
IMPRESSION: Nondisplaced fracture at surgical neck LEFT humerus likely extending
in the greater tuberosity.

## 2020-04-26 MED ORDER — ROSUVASTATIN CALCIUM 20 MG PO TABS
20.0000 mg | ORAL_TABLET | Freq: Every day | ORAL | 0 refills | Status: DC
Start: 2020-04-26 — End: 2020-11-23

## 2020-05-07 ENCOUNTER — Encounter: Payer: Self-pay | Admitting: Endocrinology

## 2020-05-07 ENCOUNTER — Encounter: Payer: Self-pay | Admitting: Family Medicine

## 2020-05-10 ENCOUNTER — Ambulatory Visit: Payer: BC Managed Care – PPO | Admitting: Family Medicine

## 2020-05-11 DIAGNOSIS — Z20822 Contact with and (suspected) exposure to covid-19: Secondary | ICD-10-CM | POA: Diagnosis not present

## 2020-05-12 ENCOUNTER — Ambulatory Visit: Payer: 59 | Admitting: Endocrinology

## 2020-05-12 ENCOUNTER — Other Ambulatory Visit: Payer: Self-pay | Admitting: Family Medicine

## 2020-05-20 ENCOUNTER — Ambulatory Visit: Payer: BC Managed Care – PPO | Admitting: Family Medicine

## 2020-06-07 ENCOUNTER — Ambulatory Visit: Payer: BC Managed Care – PPO | Admitting: Family Medicine

## 2020-06-07 ENCOUNTER — Other Ambulatory Visit: Payer: Self-pay

## 2020-06-07 ENCOUNTER — Encounter: Payer: Self-pay | Admitting: Family Medicine

## 2020-06-07 DIAGNOSIS — E785 Hyperlipidemia, unspecified: Secondary | ICD-10-CM | POA: Diagnosis not present

## 2020-06-07 DIAGNOSIS — E1169 Type 2 diabetes mellitus with other specified complication: Secondary | ICD-10-CM

## 2020-06-07 MED ORDER — INSULIN GLARGINE 100 UNIT/ML ~~LOC~~ SOLN
SUBCUTANEOUS | 3 refills | Status: DC
Start: 2020-06-07 — End: 2020-06-17

## 2020-06-07 NOTE — Progress Notes (Signed)
   Subjective:    Patient ID: Cheryl Middleton, female    DOB: 1965/04/18, 55 y.o.   MRN: [id number]  Hyperlipidemia This is a chronic problem. Treatments tried: rosuvastatin    Has appt march 31st to see endocrinologist dr Loanne Drilling. Needs refill on lantus vial. Pen was too expensive. Took last dose last night.  Patient needs a refill of the Lantus in the mild  Pain on right side of neck for about one week.  Patient had a history of tongue cancer back in 2001 She denies any setbacks currently No lumps or growths Cancer center released her back around 2011 Review of Systems See above    Objective:   Physical Exam  Lungs clear heart regular foot exam normal pulses normal BP good      Assessment & Plan:  Diabetes hyperlipidemia very important for the patient to continue her cholesterol medicine Over the past several years patient has not had good A1c readings despite utilizing numerous medicines.  Patient has been reluctant to go up on her regimen because of cost In the past she has been reluctant to see endocrinology She now is willing to see endocrinology for further evaluation

## 2020-06-07 NOTE — Patient Instructions (Signed)
The neck discomfort is more than likely musculoskeletal if it is not getting better over the next month please let me know  We will see you back in 6 months [facility]-Dr. Nicki Reaper

## 2020-06-17 ENCOUNTER — Ambulatory Visit: Payer: BC Managed Care – PPO | Admitting: Endocrinology

## 2020-06-17 ENCOUNTER — Other Ambulatory Visit: Payer: Self-pay

## 2020-06-17 VITALS — BP 144/60 | HR 106 | Ht 61.5 in | Wt 170.6 lb

## 2020-06-17 DIAGNOSIS — E119 Type 2 diabetes mellitus without complications: Secondary | ICD-10-CM | POA: Diagnosis not present

## 2020-06-17 LAB — POCT GLYCOSYLATED HEMOGLOBIN (HGB A1C): Hemoglobin A1C: 9 % — AB (ref 4.0–5.6)

## 2020-06-17 LAB — T4, FREE: Free T4: 0.8 ng/dL (ref 0.60–1.60)

## 2020-06-17 LAB — TSH: TSH: 1.33 u[IU]/mL (ref 0.35–4.50)

## 2020-06-17 MED ORDER — INSULIN GLARGINE 100 UNIT/ML ~~LOC~~ SOLN
60.0000 [IU] | SUBCUTANEOUS | 3 refills | Status: DC
Start: 2020-06-17 — End: 2020-06-30

## 2020-06-17 MED ORDER — ONETOUCH VERIO VI STRP: 1.0000 | ORAL_STRIP | Freq: Two times a day (BID) | 3 refills | Status: DC

## 2020-06-17 MED ORDER — FREESTYLE LIBRE 2 READER DEVI: 1.0000 | Freq: Once | 1 refills | Status: AC

## 2020-06-17 MED ORDER — FREESTYLE LIBRE 2 SENSOR MISC: 1.0000 | 3 refills | Status: DC

## 2020-06-17 MED ORDER — VICTOZA 18 MG/3ML ~~LOC~~ SOPN
1.8000 mg | PEN_INJECTOR | SUBCUTANEOUS | 3 refills | Status: DC
Start: 2020-06-17 — End: 2021-06-21

## 2020-06-17 NOTE — Patient Instructions (Addendum)
Your blood pressure is high today.  Please see your primary care provider soon, to have it rechecked good diet and exercise significantly improve the control of your diabetes.  please let me know if you wish to be referred to a dietician.  high blood sugar is very risky to your health.  you should see an eye doctor and dentist every year.  It is very important to get all recommended vaccinations.  Controlling your blood pressure and cholesterol drastically reduces the damage diabetes does to your body.  Those who smoke should quit.  Please discuss these with your doctor.  We will need to take this complex situation in stages I have sent a prescription to your pharmacy, for the continuous glucose monitor.   Here is a new meter.  I have sent a prescription to your pharmacy, for strips.  For now, please change the Lantus to the morning, and:  Increase the Victoza to 1.8 mg per day, and:  Please continue the same metformin.   Please come back for a follow-up appointment in 1 month.

## 2020-06-17 NOTE — Progress Notes (Signed)
Subjective:    Patient ID: Cheryl Middleton, female    DOB: [birth year], 55 y.o.   MRN: [id number]  HPI pt is referred by Dr Wolfgang Phoenix, for diabetes.  Pt states DM was dx'ed in 5809; it is complicated by DR; he has been on insulin since 2021; pt says his diet and exercise are fair; she has never had GDM, pancreatitis, pancreatic surgery, severe hypoglycemia or DKA.  She takes metformin, Lantus 60 QHS, and Victoza.  She says cbg varies from 55-250.  It is in general higher as the day goes on.   Past Medical History:  Diagnosis Date  . Cancer (HCC)    tongue  . Diabetes mellitus, type II ([place])   . Hyperlipidemia   . Malignant neoplasm of tongue (HCC)    Tongue    Past Surgical History:  Procedure Laterality Date  . BREAST LUMPECTOMY    . COLONOSCOPY N/A 04/20/2017   Procedure: COLONOSCOPY;  Surgeon: Danie Binder, MD;  [facility];  Service: Endoscopy;  Laterality: N/A;  12:00  . TONGUE SURGERY  10/19/1999   tongue cancer  . TRACHEOSTOMY      Social History   Socioeconomic History  . Marital status: Divorced    Spouse name: Not on file  . Number of children: Not on file  . Years of education: Not on file  . Highest education level: Not on file  Occupational History  . Not on file  Tobacco Use  . Smoking status: Never Smoker  . Smokeless tobacco: Never Used  Vaping Use  . Vaping Use: Never used  Substance and Sexual Activity  . Alcohol use: No  . Drug use: No  . Sexual activity: Yes    Birth control/protection: Post-menopausal  Other Topics Concern  . Not on file  Social History Narrative  . Not on file   Social Determinants of Health   Financial Resource Strain: Not on file  Food Insecurity: Not on file  Transportation Needs: Not on file  Physical Activity: Not on file  Stress: Not on file  Social Connections: Not on file  Intimate Partner Violence: Not on file    Current Outpatient Medications on File Prior to Visit  Medication Sig Dispense  Refill  . cholecalciferol (VITAMIN D) 1000 units tablet Take 1 tablet by mouth daily. otc once daily    . losartan (COZAAR) 25 MG tablet Take 1 tablet (25 mg total) by mouth daily. 90 tablet 1  . metFORMIN (GLUCOPHAGE) 1000 MG tablet Take one tablet by mouth every day with food, if tolerated increase to one twice a day 180 tablet 1  . rosuvastatin (CRESTOR) 20 MG tablet Take 1 tablet (20 mg total) by mouth daily. 90 tablet 0   No current facility-administered medications on file prior to visit.    Allergies  Allergen Reactions  . Ciprofloxacin   . Sulfonamide Derivatives   . Ibuprofen Rash  . Penicillins Rash    Has patient had a PCN reaction causing immediate rash, facial/tongue/throat swelling, SOB or lightheadedness with hypotension: Yes Has patient had a PCN reaction causing severe rash involving mucus membranes or skin necrosis: no Has patient had a PCN reaction that required hospitalization: yes Has patient had a PCN reaction occurring within the last 10 years: yes If all of the above answers are "NO", then may proceed with Cephalosporin use.     Family History  Problem Relation Age of Onset  . Hypertension Mother   . Diabetes Mother   .  Kidney disease Mother   . Hypertension Father   . Diabetes Father   . Congestive Heart Failure Father   . COPD Father   . Hypertension Sister   . Diabetes Sister   . Diabetes Sister        borderline  . ADD / ADHD Son   . Eczema Son     BP (!) 144/60 (BP Location: Right Arm, Patient Position: Sitting, Cuff Size: Large)   Pulse (!) 106   Ht 5' 1.5" (1.562 m)   Wt 170 lb 9.6 oz (77.4 kg)   LMP 07/22/2016   SpO2 96%   BMI 31.71 kg/m      Review of Systems denies weight loss, chest pain, sob, n/v, urinary frequency, and depression.     Objective:   Physical Exam VITAL SIGNS:  See vs page GENERAL: no distress Pulses: dorsalis pedis intact bilat.   MSK: no deformity of the feet CV: no leg edema Skin:  no ulcer on the  feet.  normal color and temp on the feet.   Neuro: sensation is intact to touch on the feet.    Lab Results  Component Value Date   CREATININE 0.76 03/11/2020   BUN 12 03/11/2020   NA 142 03/11/2020   K 4.9 03/11/2020   CL 102 03/11/2020   CO2 24 03/11/2020     Lab Results  Component Value Date   HGBA1C 9.0 (A) 06/17/2020   I have reviewed outside records, and summarized:  Pt was noted to have elevated A1c, and referred here.  Dyslipidemia and neck pain were also addressed.        Assessment & Plan:  Insulin-requiring type 2 DM, with DR, new to me: uncontrolled.  HTN: is noted today   Patient Instructions  Your blood pressure is high today.  Please see your primary care provider soon, to have it rechecked good diet and exercise significantly improve the control of your diabetes.  please let me know if you wish to be referred to a dietician.  high blood sugar is very risky to your health.  you should see an eye doctor and dentist every year.  It is very important to get all recommended vaccinations.  Controlling your blood pressure and cholesterol drastically reduces the damage diabetes does to your body.  Those who smoke should quit.  Please discuss these with your doctor.  We will need to take this complex situation in stages I have sent a prescription to your pharmacy, for the continuous glucose monitor.   Here is a new meter.  I have sent a prescription to your pharmacy, for strips.  For now, please change the Lantus to the morning, and:  Increase the Victoza to 1.8 mg per day, and:  Please continue the same metformin.   Please come back for a follow-up appointment in 1 month.

## 2020-06-18 ENCOUNTER — Other Ambulatory Visit: Payer: Self-pay | Admitting: Endocrinology

## 2020-06-21 ENCOUNTER — Telehealth: Payer: Self-pay | Admitting: *Deleted

## 2020-06-21 MED ORDER — DEXCOM G6 RECEIVER DEVI: 1.0000 | Freq: Once | 1 refills | Status: AC

## 2020-06-21 MED ORDER — DEXCOM G6 SENSOR MISC: 1.0000 | 3 refills | Status: DC

## 2020-06-21 MED ORDER — DEXCOM G6 TRANSMITTER MISC: 1.0000 | Freq: Once | 1 refills | Status: AC

## 2020-06-21 NOTE — Telephone Encounter (Signed)
Please advise 

## 2020-06-21 NOTE — Progress Notes (Signed)
Message sent to front to call and schedule pt an appt

## 2020-06-21 NOTE — Telephone Encounter (Signed)
done

## 2020-06-21 NOTE — Telephone Encounter (Signed)
Please call and schedule pt an appt per dr Nicki Reaper. See below:  Per dr scott:  patient's blood pressure was up at the office visit with endocrinologist.  Endocrinologist recommended a follow-up office visit with [place] to recheck blood pressure.  I would recommend setting up appointment with Santiago Glad.  May need adjustments of medicines

## 2020-06-21 NOTE — Progress Notes (Signed)
Nurses-patient's blood pressure was up at the office visit with endocrinologist.  Endocrinologist recommended a follow-up office visit with [place] to recheck blood pressure.  I would recommend setting up appointment with Santiago Glad.  May need adjustments of medicines

## 2020-06-24 ENCOUNTER — Other Ambulatory Visit: Payer: Self-pay

## 2020-06-24 ENCOUNTER — Ambulatory Visit: Payer: BC Managed Care – PPO | Admitting: Family Medicine

## 2020-06-24 ENCOUNTER — Encounter: Payer: Self-pay | Admitting: Family Medicine

## 2020-06-24 VITALS — BP 126/70 | HR 104 | Temp 97.0°F | Ht 61.5 in | Wt 169.0 lb

## 2020-06-24 DIAGNOSIS — R030 Elevated blood-pressure reading, without diagnosis of hypertension: Secondary | ICD-10-CM
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
## 2020-06-24 NOTE — Patient Instructions (Signed)

## 2020-06-25 ENCOUNTER — Telehealth: Payer: Self-pay | Admitting: Endocrinology

## 2020-06-25 NOTE — Telephone Encounter (Signed)
New Message:  [employer] is calling regarding a PA that was filled out incorrectly. Stating that the medication is due today. As well has faxed over to correct the information.For the medication  insulin glargine (LANTUS) 100 UNIT/ML injection

## 2020-06-26 ENCOUNTER — Telehealth: Payer: Self-pay

## 2020-06-26 ENCOUNTER — Encounter: Payer: Self-pay | Admitting: Endocrinology

## 2020-06-26 NOTE — Telephone Encounter (Signed)
LVM for pt to let her know that I did do her PA for Lantus and I will F/U with her when I get back in the office on 06/28/2020.

## 2020-06-28 NOTE — Telephone Encounter (Signed)
Patient also wanted me to let Mardene Celeste know that her ins received the information from [place] but they have denied the request. Patient would still like for her call to be returned when you are available.

## 2020-06-28 NOTE — Telephone Encounter (Signed)
LVM for pt to cb so that I can speak with her regarding her PA for Lantus

## 2020-06-28 NOTE — Telephone Encounter (Signed)
Patient returned call

## 2020-06-30 MED ORDER — SEMGLEE 100 UNIT/ML ~~LOC~~ SOPN: 60.0000 [IU] | PEN_INJECTOR | Freq: Every day | SUBCUTANEOUS | 3 refills | Status: DC

## 2020-06-30 NOTE — Telephone Encounter (Signed)
Spoke with pt and she stated that for now, she is taking the Victoza that she has on hand bc the Lantus was denied by [employer].  Any other suggestions for her?

## 2020-06-30 NOTE — Telephone Encounter (Signed)
I have sent a prescription to your pharmacy, to change, to see if that is covered

## 2020-06-30 NOTE — Telephone Encounter (Signed)
Pt has been informed about new Rx sent to pharmacy

## 2020-06-30 NOTE — Addendum Note (Signed)
Addended by: Renato Shin on: 06/30/2020 12:14 PM   Modules accepted: Orders

## 2020-07-01 ENCOUNTER — Telehealth: Payer: Self-pay | Admitting: Endocrinology

## 2020-07-05 ENCOUNTER — Telehealth: Payer: Self-pay

## 2020-07-05 NOTE — Telephone Encounter (Signed)
Spoke with Cheryl Middleton to give her Dr. Ronnie Derby recop mmendations on some OTC medications to help regulate her BS until Loanne Drilling is back off vacation

## 2020-07-05 NOTE — Telephone Encounter (Signed)
Just spoke with pt and she stated that her insurance company will cover [place] or levemir.  Please advise

## 2020-07-05 NOTE — Telephone Encounter (Signed)
I have submitted a PA for both Lantus and Basaglar which both has been denied by insurance. Pt is going to contact insurance company to see what the alternatives are that they will cover and get back to me. In the meantime, pt stated that she is currently taken metformin and victoza to help her BS but they are still running over 300 and she does not feel well.  Any suggestions for this or OTC medications to help

## 2020-07-05 NOTE — Telephone Encounter (Signed)
Rx

## 2020-07-05 NOTE — Telephone Encounter (Signed)
Will this be OTC as well

## 2020-07-05 NOTE — Telephone Encounter (Signed)
Spoke with Cheryl Middleton to let her Dr. Ronnie Derby recommendations for Novolin

## 2020-07-05 NOTE — Telephone Encounter (Signed)
She can try [place] U-200, same dose as the Lantus once a day

## 2020-07-05 NOTE — Telephone Encounter (Signed)
Patient called regarding her Insulin. I did inform her that the other one was called in on the 13th and for her to call pharmacy to see if that was covered. She still asked if Mardene Celeste could return her call at [phone] whenever she had time.

## 2020-07-05 NOTE — Telephone Encounter (Signed)
She can take [employer] brand Novolin N 30 units twice a day with a syringe and call next week

## 2020-07-05 NOTE — Telephone Encounter (Signed)
I have already spoken with the pt regarding this matter

## 2020-08-02 ENCOUNTER — Ambulatory Visit: Payer: BC Managed Care – PPO | Admitting: Endocrinology

## 2020-08-02 ENCOUNTER — Other Ambulatory Visit: Payer: Self-pay

## 2020-08-02 VITALS — BP 164/78 | HR 112 | Ht 61.5 in | Wt 162.4 lb

## 2020-08-02 DIAGNOSIS — E119 Type 2 diabetes mellitus without complications: Secondary | ICD-10-CM

## 2020-08-02 MED ORDER — SEMGLEE 100 UNIT/ML ~~LOC~~ SOPN
60.0000 [IU] | PEN_INJECTOR | Freq: Every day | SUBCUTANEOUS | 3 refills | Status: DC
Start: 2020-08-02 — End: 2020-10-13

## 2020-08-02 NOTE — Patient Instructions (Addendum)
Your blood pressure is high today.  Please see your primary care provider soon, to have it rechecked.   We will need to take this complex situation in stages I have sent a prescription to your pharmacy, to change the insulin to [facility], and:  Please continue the same other diabetes medications. Please come back for a follow-up appointment in 2 months.

## 2020-08-02 NOTE — Progress Notes (Signed)
Subjective:    Patient ID: Cheryl Middleton, female    DOB: May 04, 1965, 55 y.o.   MRN: [id number]  HPI Pt returns for f/u of diabetes mellitus: DM type: Insulin-requiring type 2 Dx'ed: 6010 Complications: DR Therapy: insulin since 2021 GDM: never DKA: never Severe hypoglycemia: never Pancreatitis: never Pancreatic imaging: never SDOH: She could not afford continuous glucose monitor. Other: due to noncompliance, she is not a candidate for multiple daily injections.   Interval history: Meter is downloaded today, and the printout is scanned into the record.  cbg varies from 60-255.  She resumed NPH, 30 units BID, as Semglee and Lantus were declined by insurance. However, she sometimes misses the PM dose.  I reviewed records.  Semglee does not need PA.   Past Medical History:  Diagnosis Date  . Cancer (HCC)    tongue  . Diabetes mellitus, type II ([place])   . Hyperlipidemia   . Malignant neoplasm of tongue (HCC)    Tongue    Past Surgical History:  Procedure Laterality Date  . BREAST LUMPECTOMY    . COLONOSCOPY N/A 04/20/2017   Procedure: COLONOSCOPY;  Surgeon: Danie Binder, MD;  [facility];  Service: Endoscopy;  Laterality: N/A;  12:00  . TONGUE SURGERY  10/19/1999   tongue cancer  . TRACHEOSTOMY      Social History   Socioeconomic History  . Marital status: Divorced    Spouse name: Not on file  . Number of children: Not on file  . Years of education: Not on file  . Highest education level: Not on file  Occupational History  . Not on file  Tobacco Use  . Smoking status: Never Smoker  . Smokeless tobacco: Never Used  Vaping Use  . Vaping Use: Never used  Substance and Sexual Activity  . Alcohol use: No  . Drug use: No  . Sexual activity: Yes    Birth control/protection: Post-menopausal  Other Topics Concern  . Not on file  Social History Narrative  . Not on file   Social Determinants of Health   Financial Resource Strain: Not on file  Food  Insecurity: Not on file  Transportation Needs: Not on file  Physical Activity: Not on file  Stress: Not on file  Social Connections: Not on file  Intimate Partner Violence: Not on file    Current Outpatient Medications on File Prior to Visit  Medication Sig Dispense Refill  . cholecalciferol (VITAMIN D) 1000 units tablet Take 1 tablet by mouth daily. otc once daily    . Continuous Blood Gluc Sensor (DEXCOM G6 SENSOR) MISC 1 Device by Does not apply route See admin instructions. Change every 10 days 9 each 3  . glucose blood (ONETOUCH VERIO) test strip 1 each by Other route 2 (two) times daily. And lancets 2/day 200 each 3  . liraglutide (VICTOZA) 18 MG/3ML SOPN Inject 1.8 mg into the skin every morning. 27 mL 3  . losartan (COZAAR) 25 MG tablet Take 1 tablet (25 mg total) by mouth daily. 90 tablet 1  . metFORMIN (GLUCOPHAGE) 1000 MG tablet Take one tablet by mouth every day with food, if tolerated increase to one twice a day 180 tablet 1  . rosuvastatin (CRESTOR) 20 MG tablet Take 1 tablet (20 mg total) by mouth daily. 90 tablet 0   No current facility-administered medications on file prior to visit.    Allergies  Allergen Reactions  . Ciprofloxacin   . Sulfonamide Derivatives   . Ibuprofen Rash  .  Penicillins Rash    Has patient had a PCN reaction causing immediate rash, facial/tongue/throat swelling, SOB or lightheadedness with hypotension: Yes Has patient had a PCN reaction causing severe rash involving mucus membranes or skin necrosis: no Has patient had a PCN reaction that required hospitalization: yes Has patient had a PCN reaction occurring within the last 10 years: yes If all of the above answers are "NO", then may proceed with Cephalosporin use.     Family History  Problem Relation Age of Onset  . Hypertension Mother   . Diabetes Mother   . Kidney disease Mother   . Hypertension Father   . Diabetes Father   . Congestive Heart Failure Father   . COPD Father   .  Hypertension Sister   . Diabetes Sister   . Diabetes Sister        borderline  . ADD / ADHD Son   . Eczema Son     BP (!) 164/78 (BP Location: Right Arm, Patient Position: Sitting, Cuff Size: Large)   Pulse (!) 112   Ht 5' 1.5" (1.562 m)   Wt 162 lb 6.4 oz (73.7 kg)   LMP 07/22/2016   SpO2 96%   BMI 30.19 kg/m    Review of Systems     Objective:   Physical Exam VITAL SIGNS:  See vs page GENERAL: no distress Pulses: dorsalis pedis intact bilat.   MSK: no deformity of the feet CV: trace bilat leg edema Skin:  no ulcer on the feet.  normal color and temp on the feet. Neuro: sensation is intact to touch on the feet  Lab Results  Component Value Date   TSH 1.33 06/17/2020       Assessment & Plan:  Insulin-requiring type 2 DM. Noncompliance with insulin: we'll try changing to QD insulin, to see if ins will pay.    Patient Instructions  Your blood pressure is high today.  Please see your primary care provider soon, to have it rechecked.   We will need to take this complex situation in stages I have sent a prescription to your pharmacy, to change the insulin to [facility], and:  Please continue the same other diabetes medications. Please come back for a follow-up appointment in 2 months.

## 2020-08-04 ENCOUNTER — Encounter: Payer: Self-pay | Admitting: Endocrinology

## 2020-09-06 ENCOUNTER — Telehealth: Payer: Self-pay | Admitting: Pharmacy Technician

## 2020-09-06 NOTE — Telephone Encounter (Addendum)
Patient [facility] Encounter   Received notification from [employer] that prior authorization for DEXCOM G6 TRANSMITTER is required.   PA submitted on 09/06/2020 Key [id number] Status is APPROVED.  RX# [id number]    [facility] will continue to follow.   Venida Jarvis. Nadara Mustard, CPhT [facility] Phone: [phone] Fax:  [phone]

## 2020-09-10 ENCOUNTER — Encounter: Payer: Self-pay | Admitting: Endocrinology

## 2020-09-19 ENCOUNTER — Other Ambulatory Visit: Payer: Self-pay | Admitting: Family Medicine

## 2020-09-22 ENCOUNTER — Telehealth: Payer: Self-pay | Admitting: Pharmacy Technician

## 2020-09-22 NOTE — Telephone Encounter (Addendum)
Patient [facility]ocate Encounter   Received notification from [facility] that prior authorization for ONE TOUCH VERIO STRIPS is required.   PA submitted on 09/22/2020  Key [id number] Status is APPROVED through 09/21/2021    [facility] will continue to follow.   Venida Jarvis. Nadara Mustard, CPhT [facility] Phone: [phone] Fax:  [phone]

## 2020-10-13 ENCOUNTER — Ambulatory Visit (INDEPENDENT_AMBULATORY_CARE_PROVIDER_SITE_OTHER): Payer: BC Managed Care – PPO | Admitting: Endocrinology

## 2020-10-13 ENCOUNTER — Other Ambulatory Visit: Payer: Self-pay

## 2020-10-13 VITALS — BP 150/70 | HR 107 | Ht 61.5 in | Wt 162.6 lb

## 2020-10-13 DIAGNOSIS — E119 Type 2 diabetes mellitus without complications: Secondary | ICD-10-CM

## 2020-10-13 LAB — POCT GLYCOSYLATED HEMOGLOBIN (HGB A1C): Hemoglobin A1C: 8 % — AB (ref 4.0–5.6)

## 2020-10-13 MED ORDER — SEMGLEE 100 UNIT/ML ~~LOC~~ SOPN
60.0000 [IU] | PEN_INJECTOR | Freq: Every day | SUBCUTANEOUS | 3 refills | Status: DC
Start: 2020-10-13 — End: 2020-12-22

## 2020-10-13 NOTE — Progress Notes (Signed)
Subjective:    Patient ID: Cheryl Middleton, female    DOB: 1965/11/02, 55 y.o.   MRN: [id number]  HPI Pt returns for f/u of diabetes mellitus: DM type: Insulin-requiring type 2 Dx'ed: [id number] Complications: DR Therapy: insulin since 2021 GDM: never DKA: never Severe hypoglycemia: never Pancreatitis: never Pancreatic imaging: never SDOH: Semglee and Lantus were declined by insurance.   Other: due to noncompliance, she is not a candidate for multiple daily injections.   Interval history: I reviewed continuous glucose monitor data.  Glucose varies from 90-300.  It is in general highest at 9PM-3AM, and lowest at 6AM-2PM.  She still takes NPH, 30 units BID, as ins has not yet approved semglee.   Past Medical History:  Diagnosis Date   Cancer ([place])    tongue   Diabetes mellitus, type II ([place])    Hyperlipidemia    Malignant neoplasm of tongue ([place])    Tongue    Past Surgical History:  Procedure Laterality Date   BREAST LUMPECTOMY     COLONOSCOPY N/A 04/20/2017   Procedure: COLONOSCOPY;  Surgeon: Danie Binder, MD;  [facility];  Service: Endoscopy;  Laterality: N/A;  12:00   TONGUE SURGERY  10/19/1999   tongue cancer   TRACHEOSTOMY      Social History   Socioeconomic History   Marital status: Divorced    Spouse name: Not on file   Number of children: Not on file   Years of education: Not on file   Highest education level: Not on file  Occupational History   Not on file  Tobacco Use   Smoking status: Never   Smokeless tobacco: Never  Vaping Use   Vaping Use: Never used  Substance and Sexual Activity   Alcohol use: No   Drug use: No   Sexual activity: Yes    Birth control/protection: Post-menopausal  Other Topics Concern   Not on file  Social History Narrative   Not on file   Social Determinants of Health   Financial Resource Strain: Not on file  Food Insecurity: Not on file  Transportation Needs: Not on file  Physical Activity: Not on file   Stress: Not on file  Social Connections: Not on file  Intimate Partner Violence: Not on file    Current Outpatient Medications on File Prior to Visit  Medication Sig Dispense Refill   cholecalciferol (VITAMIN D) 1000 units tablet Take 1 tablet by mouth daily. otc once daily     Continuous Blood Gluc Sensor (DEXCOM G6 SENSOR) MISC 1 Device by Does not apply route See admin instructions. Change every 10 days 9 each 3   glucose blood (ONETOUCH VERIO) test strip 1 each by Other route 2 (two) times daily. And lancets 2/day 200 each 3   liraglutide (VICTOZA) 18 MG/3ML SOPN Inject 1.8 mg into the skin every morning. 27 mL 3   losartan (COZAAR) 25 MG tablet TAKE 1 TABLET BY MOUTH EVERY DAY 90 tablet 1   metFORMIN (GLUCOPHAGE) 1000 MG tablet TAKE ONE TABLET BY MOUTH EVERY DAY WITH FOOD, IF TOLERATED INCREASE TO ONE TWICE A DAY 180 tablet 1   rosuvastatin (CRESTOR) 20 MG tablet Take 1 tablet (20 mg total) by mouth daily. 90 tablet 0   No current facility-administered medications on file prior to visit.    Allergies  Allergen Reactions   Ciprofloxacin    Sulfonamide Derivatives    Ibuprofen Rash   Penicillins Rash    Has patient had a PCN reaction  causing immediate rash, facial/tongue/throat swelling, SOB or lightheadedness with hypotension: Yes Has patient had a PCN reaction causing severe rash involving mucus membranes or skin necrosis: no Has patient had a PCN reaction that required hospitalization: yes Has patient had a PCN reaction occurring within the last 10 years: yes If all of the above answers are "NO", then may proceed with Cephalosporin use.     Family History  Problem Relation Age of Onset   Hypertension Mother    Diabetes Mother    Kidney disease Mother    Hypertension Father    Diabetes Father    Congestive Heart Failure Father    COPD Father    Hypertension Sister    Diabetes Sister    Diabetes Sister        borderline   ADD / ADHD Son    Eczema Son     BP (!)  150/70 (BP Location: Right Arm, Patient Position: Sitting, Cuff Size: Normal)   Pulse (!) 107   Ht 5' 1.5" (1.562 m)   Wt 162 lb 9.6 oz (73.8 kg)   LMP 07/22/2016   SpO2 96%   BMI 30.23 kg/m    Review of Systems     Objective:   Physical Exam Pulses: dorsalis pedis intact bilat.   MSK: no deformity of the feet CV: trace bilat leg edema Skin:  no ulcer on the feet.  normal color and temp on the feet.  Neuro: sensation is intact to touch on the feet.    Lab Results  Component Value Date   CREATININE 0.76 03/11/2020   BUN 12 03/11/2020   NA 142 03/11/2020   K 4.9 03/11/2020   CL 102 03/11/2020   CO2 24 03/11/2020   A1c=8.0%    Assessment & Plan:  Insulin-requiring type 2 DM: uncontrolled.   Patient Instructions  Your blood pressure is high today.  Please see your primary care provider soon, to have it rechecked.   We will need to take this complex situation in stages.   I have sent a prescription to your pharmacy, to try changing the insulin to [facility] again. I wrote the [facility] for 60 units each morning, and a quantity of 105 units.  For safety reasons, the prescribed amount has to be what you are actually taking.   Please continue the same other diabetes medications.   Please come back for a follow-up appointment in 2 months.

## 2020-10-13 NOTE — Patient Instructions (Addendum)
Your blood pressure is high today.  Please see your primary care provider soon, to have it rechecked.   We will need to take this complex situation in stages.   I have sent a prescription to your pharmacy, to try changing the insulin to [facility] again. I wrote the [facility] for 60 units each morning, and a quantity of 105 units.  For safety reasons, the prescribed amount has to be what you are actually taking.   Please continue the same other diabetes medications.   Please come back for a follow-up appointment in 2 months.

## 2020-10-14 ENCOUNTER — Other Ambulatory Visit (HOSPITAL_COMMUNITY): Payer: Self-pay

## 2020-10-19 ENCOUNTER — Encounter: Payer: Self-pay | Admitting: Endocrinology

## 2020-11-04 ENCOUNTER — Other Ambulatory Visit (HOSPITAL_COMMUNITY): Payer: Self-pay

## 2020-11-04 ENCOUNTER — Telehealth: Payer: Self-pay | Admitting: Pharmacy Technician

## 2020-11-04 NOTE — Telephone Encounter (Addendum)
Patient Advocate Encounter   Received notification from PATIENT that prior authorization for [facility] is required.   PA submitted on 11/04/2020 Key [id number] Status is CANCELLED   Pharmacy can process.    [facility] will continue to follow   Ronney Asters, CPhT Patient Advocate [facility] Phone: [phone] Fax:  [phone]

## 2020-11-08 ENCOUNTER — Other Ambulatory Visit (HOSPITAL_COMMUNITY): Payer: Self-pay

## 2020-11-23 ENCOUNTER — Other Ambulatory Visit: Payer: Self-pay | Admitting: Family Medicine

## 2020-12-13 ENCOUNTER — Other Ambulatory Visit (HOSPITAL_COMMUNITY): Payer: Self-pay | Admitting: Family Medicine

## 2020-12-13 DIAGNOSIS — Z1231 Encounter for screening mammogram for malignant neoplasm of breast: Secondary | ICD-10-CM

## 2020-12-22 ENCOUNTER — Telehealth: Payer: Self-pay | Admitting: Endocrinology

## 2020-12-22 ENCOUNTER — Other Ambulatory Visit: Payer: Self-pay

## 2020-12-22 ENCOUNTER — Ambulatory Visit: Payer: BC Managed Care – PPO | Admitting: Endocrinology

## 2020-12-22 VITALS — BP 160/90 | HR 106 | Ht 61.5 in | Wt 167.2 lb

## 2020-12-22 DIAGNOSIS — E119 Type 2 diabetes mellitus without complications: Secondary | ICD-10-CM

## 2020-12-22 LAB — POCT GLYCOSYLATED HEMOGLOBIN (HGB A1C): Hemoglobin A1C: 8.2 % — AB (ref 4.0–5.6)

## 2020-12-22 MED ORDER — INSULIN NPH (HUMAN) (ISOPHANE) 100 UNIT/ML ~~LOC~~ SUSP: SUBCUTANEOUS | 11 refills | Status: DC

## 2020-12-22 NOTE — Patient Instructions (Addendum)
Your blood pressure is high today.  Please see your primary care provider soon, to have it rechecked.   We will need to take this complex situation in stages.   Please change the insulin to 40 units each morning, and 20 units each evening.   Please continue the same other diabetes medications.  I am asking what else can be done about the semglee.    Please come back for a follow-up appointment in 2 months.

## 2020-12-22 NOTE — Progress Notes (Signed)
Subjective:    Patient ID: Cheryl Middleton, female    DOB: 04-Nov-1965, 55 y.o.   MRN: [id number]  HPI Pt returns for f/u of diabetes mellitus: DM type: Insulin-requiring type 2 Dx'ed: 9833 Complications: DR Therapy: insulin since 2021, Victoza, and metformin.  GDM: never DKA: never Severe hypoglycemia: never Pancreatitis: never Pancreatic imaging: never.   SDOH: Semglee and Lantus were declined by insurance.   Other: due to noncompliance, she is not a candidate for multiple daily injections; she eats meals at 9AM, 2PM, and 6PM.     Interval history: I reviewed continuous glucose monitor data.  Glucose varies from 90-330.  It increases 2PM-11PM, then decreases until 2PM again.  She still takes NPH, 30 units BID, as ins still has not yet approved semglee.   Past Medical History:  Diagnosis Date   Cancer ([place])    tongue   Diabetes mellitus, type II ([place])    Hyperlipidemia    Malignant neoplasm of tongue ([place])    Tongue    Past Surgical History:  Procedure Laterality Date   BREAST LUMPECTOMY     COLONOSCOPY N/A 04/20/2017   Procedure: COLONOSCOPY;  Surgeon: Danie Binder, MD;  [facility];  Service: Endoscopy;  Laterality: N/A;  12:00   TONGUE SURGERY  10/19/1999   tongue cancer   TRACHEOSTOMY      Social History   Socioeconomic History   Marital status: Divorced    Spouse name: Not on file   Number of children: Not on file   Years of education: Not on file   Highest education level: Not on file  Occupational History   Not on file  Tobacco Use   Smoking status: Never   Smokeless tobacco: Never  Vaping Use   Vaping Use: Never used  Substance and Sexual Activity   Alcohol use: No   Drug use: No   Sexual activity: Yes    Birth control/protection: Post-menopausal  Other Topics Concern   Not on file  Social History Narrative   Not on file   Social Determinants of Health   Financial Resource Strain: Not on file  Food Insecurity: Not on file   Transportation Needs: Not on file  Physical Activity: Not on file  Stress: Not on file  Social Connections: Not on file  Intimate Partner Violence: Not on file    Current Outpatient Medications on File Prior to Visit  Medication Sig Dispense Refill   cholecalciferol (VITAMIN D) 1000 units tablet Take 1 tablet by mouth daily. otc once daily     Continuous Blood Gluc Sensor (DEXCOM G6 SENSOR) MISC 1 Device by Does not apply route See admin instructions. Change every 10 days 9 each 3   glucose blood (ONETOUCH VERIO) test strip 1 each by Other route 2 (two) times daily. And lancets 2/day 200 each 3   liraglutide (VICTOZA) 18 MG/3ML SOPN Inject 1.8 mg into the skin every morning. 27 mL 3   losartan (COZAAR) 25 MG tablet TAKE 1 TABLET BY MOUTH EVERY DAY 90 tablet 1   metFORMIN (GLUCOPHAGE) 1000 MG tablet TAKE ONE TABLET BY MOUTH EVERY DAY WITH FOOD, IF TOLERATED INCREASE TO ONE TWICE A DAY 180 tablet 1   rosuvastatin (CRESTOR) 20 MG tablet TAKE 1 TABLET BY MOUTH EVERY DAY 90 tablet 0   No current facility-administered medications on file prior to visit.    Allergies  Allergen Reactions   Ciprofloxacin    Sulfonamide Derivatives    Ibuprofen Rash  Penicillins Rash    Has patient had a PCN reaction causing immediate rash, facial/tongue/throat swelling, SOB or lightheadedness with hypotension: Yes Has patient had a PCN reaction causing severe rash involving mucus membranes or skin necrosis: no Has patient had a PCN reaction that required hospitalization: yes Has patient had a PCN reaction occurring within the last 10 years: yes If all of the above answers are "NO", then may proceed with Cephalosporin use.     Family History  Problem Relation Age of Onset   Hypertension Mother    Diabetes Mother    Kidney disease Mother    Hypertension Father    Diabetes Father    Congestive Heart Failure Father    COPD Father    Hypertension Sister    Diabetes Sister    Diabetes Sister         borderline   ADD / ADHD Son    Eczema Son     BP (!) 160/90 (BP Location: Right Arm, Patient Position: Sitting, Cuff Size: Normal)   Pulse (!) 106   Ht 5' 1.5" (1.562 m)   Wt 167 lb 3.2 oz (75.8 kg)   LMP 07/22/2016   SpO2 95%   BMI 31.08 kg/m    Review of Systems     Objective:   Physical Exam Pulses: dorsalis pedis intact bilat.   MSK: no deformity of the feet CV: 1+ bilat leg edema.  Skin:  no ulcer on the feet.  normal color and temp on the feet.  Neuro: sensation is intact to touch on the feet.   Lab Results  Component Value Date   HGBA1C 8.2 (A) 12/22/2020      Assessment & Plan:  Insulin-requiring type 2 DM: uncontrolled.    Patient Instructions  Your blood pressure is high today.  Please see your primary care provider soon, to have it rechecked.   We will need to take this complex situation in stages.   Please change the insulin to 40 units each morning, and 20 units each evening.   Please continue the same other diabetes medications.  I am asking what else can be done about the semglee.    Please come back for a follow-up appointment in 2 months.

## 2020-12-22 NOTE — Telephone Encounter (Signed)
I need to know what the outcome of the 11/04/20 PA was.  TY.

## 2020-12-23 ENCOUNTER — Other Ambulatory Visit: Payer: Self-pay | Admitting: Endocrinology

## 2020-12-23 ENCOUNTER — Other Ambulatory Visit (HOSPITAL_COMMUNITY): Payer: Self-pay

## 2020-12-23 MED ORDER — INSULIN GLARGINE-YFGN 100 UNIT/ML ~~LOC~~ SOPN: 60.0000 [IU] | PEN_INJECTOR | SUBCUTANEOUS | 3 refills | Status: DC

## 2020-12-23 NOTE — Telephone Encounter (Signed)
Message sent thru MyChart 

## 2021-01-05 ENCOUNTER — Encounter: Payer: Self-pay | Admitting: Endocrinology

## 2021-01-07 ENCOUNTER — Other Ambulatory Visit: Payer: Self-pay

## 2021-01-07 ENCOUNTER — Ambulatory Visit (HOSPITAL_COMMUNITY)
Admission: RE | Admit: 2021-01-07 | Discharge: 2021-01-07 | Disposition: A | Discharge status: Home / Self Care | Payer: BC Managed Care – PPO | Source: Ambulatory Visit | Attending: Family Medicine | Admitting: Family Medicine

## 2021-01-07 DIAGNOSIS — Z1231 Encounter for screening mammogram for malignant neoplasm of breast: Secondary | ICD-10-CM | POA: Insufficient documentation

## 2021-01-14 ENCOUNTER — Ambulatory Visit (HOSPITAL_COMMUNITY)
Admission: RE | Admit: 2021-01-14 | Discharge: 2021-01-14 | Disposition: A | Discharge status: Home / Self Care | Payer: BC Managed Care – PPO | Source: Ambulatory Visit | Attending: Family Medicine | Admitting: Family Medicine

## 2021-01-14 ENCOUNTER — Ambulatory Visit (HOSPITAL_COMMUNITY): Payer: BC Managed Care – PPO

## 2021-01-14 ENCOUNTER — Encounter (HOSPITAL_COMMUNITY): Payer: Self-pay

## 2021-01-14 ENCOUNTER — Other Ambulatory Visit: Payer: Self-pay

## 2021-01-14 DIAGNOSIS — Z1231 Encounter for screening mammogram for malignant neoplasm of breast: Secondary | ICD-10-CM | POA: Insufficient documentation

## 2021-01-23 ENCOUNTER — Encounter (HOSPITAL_COMMUNITY): Payer: Self-pay | Admitting: Emergency Medicine

## 2021-01-23 ENCOUNTER — Emergency Department (HOSPITAL_COMMUNITY): Payer: BC Managed Care – PPO

## 2021-01-23 ENCOUNTER — Other Ambulatory Visit: Payer: Self-pay

## 2021-01-23 ENCOUNTER — Inpatient Hospital Stay (HOSPITAL_COMMUNITY)
Admission: EM | Admit: 2021-01-23 | Discharge: 2021-01-25 | DRG: 065 | Disposition: A | Discharge status: Home / Self Care | Payer: BC Managed Care – PPO | Attending: Internal Medicine | Admitting: Internal Medicine

## 2021-01-23 DIAGNOSIS — Z825 Family history of asthma and other chronic lower respiratory diseases: Secondary | ICD-10-CM | POA: Diagnosis not present

## 2021-01-23 DIAGNOSIS — I10 Essential (primary) hypertension: Secondary | ICD-10-CM | POA: Diagnosis not present

## 2021-01-23 DIAGNOSIS — R471 Dysarthria and anarthria: Secondary | ICD-10-CM | POA: Diagnosis present

## 2021-01-23 DIAGNOSIS — Z85810 Personal history of malignant neoplasm of tongue: Secondary | ICD-10-CM | POA: Diagnosis not present

## 2021-01-23 DIAGNOSIS — Z841 Family history of disorders of kidney and ureter: Secondary | ICD-10-CM

## 2021-01-23 DIAGNOSIS — Z8249 Family history of ischemic heart disease and other diseases of the circulatory system: Secondary | ICD-10-CM | POA: Diagnosis not present

## 2021-01-23 DIAGNOSIS — I6523 Occlusion and stenosis of bilateral carotid arteries: Secondary | ICD-10-CM | POA: Diagnosis not present

## 2021-01-23 DIAGNOSIS — R202 Paresthesia of skin: Secondary | ICD-10-CM | POA: Diagnosis present

## 2021-01-23 DIAGNOSIS — I639 Cerebral infarction, unspecified: Secondary | ICD-10-CM | POA: Diagnosis not present

## 2021-01-23 DIAGNOSIS — G8191 Hemiplegia, unspecified affecting right dominant side: Secondary | ICD-10-CM | POA: Diagnosis present

## 2021-01-23 DIAGNOSIS — Z794 Long term (current) use of insulin: Secondary | ICD-10-CM | POA: Diagnosis not present

## 2021-01-23 DIAGNOSIS — Z882 Allergy status to sulfonamides status: Secondary | ICD-10-CM | POA: Diagnosis not present

## 2021-01-23 DIAGNOSIS — Z20822 Contact with and (suspected) exposure to covid-19: Secondary | ICD-10-CM | POA: Diagnosis present

## 2021-01-23 DIAGNOSIS — R29702 NIHSS score 2: Secondary | ICD-10-CM | POA: Diagnosis not present

## 2021-01-23 DIAGNOSIS — I63442 Cerebral infarction due to embolism of left cerebellar artery: Principal | ICD-10-CM | POA: Diagnosis present

## 2021-01-23 DIAGNOSIS — E785 Hyperlipidemia, unspecified: Secondary | ICD-10-CM | POA: Diagnosis not present

## 2021-01-23 DIAGNOSIS — I6612 Occlusion and stenosis of left anterior cerebral artery: Secondary | ICD-10-CM | POA: Diagnosis not present

## 2021-01-23 DIAGNOSIS — R29818 Other symptoms and signs involving the nervous system: Secondary | ICD-10-CM | POA: Diagnosis not present

## 2021-01-23 DIAGNOSIS — R4781 Slurred speech: Secondary | ICD-10-CM | POA: Diagnosis not present

## 2021-01-23 DIAGNOSIS — Z881 Allergy status to other antibiotic agents status: Secondary | ICD-10-CM | POA: Diagnosis not present

## 2021-01-23 DIAGNOSIS — E119 Type 2 diabetes mellitus without complications: Secondary | ICD-10-CM | POA: Diagnosis present

## 2021-01-23 DIAGNOSIS — Z880 Allergy status to penicillin: Secondary | ICD-10-CM | POA: Diagnosis not present

## 2021-01-23 DIAGNOSIS — Z833 Family history of diabetes mellitus: Secondary | ICD-10-CM | POA: Diagnosis not present

## 2021-01-23 DIAGNOSIS — Z886 Allergy status to analgesic agent status: Secondary | ICD-10-CM | POA: Diagnosis not present

## 2021-01-23 DIAGNOSIS — Z7984 Long term (current) use of oral hypoglycemic drugs: Secondary | ICD-10-CM | POA: Diagnosis not present

## 2021-01-23 DIAGNOSIS — I6389 Other cerebral infarction: Secondary | ICD-10-CM | POA: Diagnosis not present

## 2021-01-23 DIAGNOSIS — Z7985 Long-term (current) use of injectable non-insulin antidiabetic drugs: Secondary | ICD-10-CM | POA: Diagnosis not present

## 2021-01-23 DIAGNOSIS — Z9889 Other specified postprocedural states: Secondary | ICD-10-CM | POA: Diagnosis not present

## 2021-01-23 LAB — COMPREHENSIVE METABOLIC PANEL
ALT: 14 U/L (ref 0–44)
AST: 19 U/L (ref 15–41)
Albumin: 3.9 g/dL (ref 3.5–5.0)
Alkaline Phosphatase: 95 U/L (ref 38–126)
Anion gap: 7 (ref 5–15)
BUN: 13 mg/dL (ref 6–20)
CO2: 30 mmol/L (ref 22–32)
Calcium: 9.1 mg/dL (ref 8.9–10.3)
Chloride: 105 mmol/L (ref 98–111)
Creatinine, Ser: 0.74 mg/dL (ref 0.44–1.00)
GFR, Estimated: >60 mL/min (ref >60)
Glucose, Bld: 96 mg/dL (ref 70–99)
Potassium: 4.6 mmol/L (ref 3.5–5.1)
Sodium: 142 mmol/L (ref 135–145)
Total Bilirubin: 0.4 mg/dL (ref 0.3–1.2)
Total Protein: 7.8 g/dL (ref 6.5–8.1)

## 2021-01-23 LAB — GLUCOSE, CAPILLARY
Glucose-Capillary: 88 mg/dL (ref 70–99)
Glucose-Capillary: 205 mg/dL — ABNORMAL HIGH (ref 70–99)

## 2021-01-23 LAB — CBC WITH DIFFERENTIAL/PLATELET
Abs Immature Granulocytes: 0.02 10*3/uL (ref 0.00–0.07)
Basophils Absolute: 0.1 10*3/uL (ref 0.0–0.1)
Basophils Relative: 1 %
Eosinophils Absolute: 0.4 10*3/uL (ref 0.0–0.5)
Eosinophils Relative: 5 %
HCT: 37.5 % (ref 36.0–46.0)
Hemoglobin: 12.1 g/dL (ref 12.0–15.0)
Immature Granulocytes: 0 %
Lymphocytes Relative: 29 %
Lymphs Abs: 2.3 10*3/uL (ref 0.7–4.0)
MCH: 29.7 pg (ref 26.0–34.0)
MCHC: 32.3 g/dL (ref 30.0–36.0)
MCV: 91.9 fL (ref 80.0–100.0)
Monocytes Absolute: 0.6 10*3/uL (ref 0.1–1.0)
Monocytes Relative: 8 %
Neutro Abs: 4.5 10*3/uL (ref 1.7–7.7)
Neutrophils Relative %: 57 %
Platelets: 257 10*3/uL (ref 150–400)
RBC: 4.08 MIL/uL (ref 3.87–5.11)
RDW: 14 % (ref 11.5–15.5)
WBC: 7.9 10*3/uL (ref 4.0–10.5)
nRBC: 0 % (ref 0.0–0.2)

## 2021-01-23 LAB — CBC
HCT: 37.5 % (ref 36.0–46.0)
Hemoglobin: 11.9 g/dL — ABNORMAL LOW (ref 12.0–15.0)
MCH: 29.2 pg (ref 26.0–34.0)
MCHC: 31.7 g/dL (ref 30.0–36.0)
MCV: 92.1 fL (ref 80.0–100.0)
Platelets: 264 10*3/uL (ref 150–400)
RBC: 4.07 MIL/uL (ref 3.87–5.11)
RDW: 14 % (ref 11.5–15.5)
WBC: 8.2 10*3/uL (ref 4.0–10.5)
nRBC: 0 % (ref 0.0–0.2)

## 2021-01-23 LAB — CREATININE, SERUM
Creatinine, Ser: 0.67 mg/dL (ref 0.44–1.00)
GFR, Estimated: >60 mL/min

## 2021-01-23 LAB — HIV ANTIBODY (ROUTINE TESTING W REFLEX): HIV Screen 4th Generation wRfx: NONREACTIVE

## 2021-01-23 LAB — RESP PANEL BY RT-PCR (FLU A&B, COVID) ARPGX2
Influenza A by PCR: NEGATIVE
Influenza B by PCR: NEGATIVE
SARS Coronavirus 2 by RT PCR: NEGATIVE

## 2021-01-23 LAB — CBG MONITORING, ED: Glucose-Capillary: 87 mg/dL (ref 70–99)

## 2021-01-23 MED ORDER — INSULIN GLARGINE-YFGN 100 UNIT/ML ~~LOC~~ SOPN
30.0000 [IU] | PEN_INJECTOR | SUBCUTANEOUS | Status: DC
  Filled 2021-01-23: qty 3

## 2021-01-23 MED ORDER — ACETAMINOPHEN 160 MG/5ML PO SOLN: 650.0000 mg | ORAL | Status: DC | PRN

## 2021-01-23 MED ORDER — ROSUVASTATIN CALCIUM 20 MG PO TABS
20.0000 mg | ORAL_TABLET | Freq: Every day | ORAL | Status: DC
  Administered 2021-01-23 – 2021-01-24 (×2): 20 mg via ORAL
  Filled 2021-01-23 (×2): qty 1

## 2021-01-23 MED ORDER — ACETAMINOPHEN 650 MG RE SUPP: 650.0000 mg | RECTAL | Status: DC | PRN

## 2021-01-23 MED ORDER — INSULIN ASPART 100 UNIT/ML IJ SOLN
0.0000 [IU] | Freq: Three times a day (TID) | INTRAMUSCULAR | Status: DC
Start: 2021-01-23 — End: 2021-01-25
  Administered 2021-01-24: 3 [IU] via SUBCUTANEOUS
  Administered 2021-01-24: 11 [IU] via SUBCUTANEOUS
  Administered 2021-01-25: 3 [IU] via SUBCUTANEOUS

## 2021-01-23 MED ORDER — INSULIN ASPART 100 UNIT/ML IJ SOLN
0.0000 [IU] | Freq: Every day | INTRAMUSCULAR | Status: DC
  Administered 2021-01-23: 2 [IU] via SUBCUTANEOUS

## 2021-01-23 MED ORDER — ASPIRIN EC 325 MG PO TBEC
325.0000 mg | DELAYED_RELEASE_TABLET | Freq: Once | ORAL | Status: AC
  Administered 2021-01-23: 325 mg via ORAL
  Filled 2021-01-23: qty 1

## 2021-01-23 MED ORDER — LIRAGLUTIDE 18 MG/3ML ~~LOC~~ SOPN
1.8000 mg | PEN_INJECTOR | SUBCUTANEOUS | Status: DC
Start: 2021-01-24 — End: 2021-01-25
  Administered 2021-01-24 – 2021-01-25 (×2): 1.8 mg via SUBCUTANEOUS

## 2021-01-23 MED ORDER — SODIUM CHLORIDE 0.9 % IV SOLN: INTRAVENOUS | Status: AC

## 2021-01-23 MED ORDER — STROKE: EARLY STAGES OF RECOVERY BOOK
Freq: Once | Status: AC
  Filled 2021-01-23: qty 1

## 2021-01-23 MED ORDER — ACETAMINOPHEN 325 MG PO TABS: 650.0000 mg | ORAL_TABLET | ORAL | Status: DC | PRN

## 2021-01-23 MED ORDER — ENOXAPARIN SODIUM 40 MG/0.4ML IJ SOSY
40.0000 mg | PREFILLED_SYRINGE | INTRAMUSCULAR | Status: DC
  Administered 2021-01-23 – 2021-01-24 (×2): 40 mg via SUBCUTANEOUS
  Filled 2021-01-23 (×2): qty 0.4

## 2021-01-23 NOTE — ED Provider Notes (Signed)
[facility] EMERGENCY DEPARTMENT Provider Note   CSN: [id number] Arrival date & time: 01/23/21  1136     History Chief Complaint  Patient presents with   Aphasia    CHERRISE OCCHIPINTI is a 55 y.o. female.  Patient complains of weakness in her right arm right leg with slurred speech that started approximately 25 hours prior to coming into the emergency department  The history is provided by the patient and medical records. No language interpreter was used.  Weakness Severity:  Moderate Onset quality:  Sudden Duration:  25 hours Timing:  Constant Progression:  Waxing and waning Chronicity:  New Context: not alcohol use   Relieved by:  Nothing Worsened by:  Nothing Ineffective treatments:  None tried Associated symptoms: no abdominal pain, no chest pain, no cough, no diarrhea, no frequency, no headaches and no seizures       Past Medical History:  Diagnosis Date   Cancer ([place])    tongue   Diabetes mellitus, type II ([place])    Hyperlipidemia    Malignant neoplasm of tongue ([place])    Tongue    Patient Active Problem List   Diagnosis Date Noted   CVA (cerebral vascular accident) ([place]) 01/23/2021   Elevated blood pressure reading 06/24/2020   Diabetes mellitus without complication ([place]) 26/94/8546   Special screening for malignant neoplasms, colon    Vitamin D deficiency 06/12/2016   Personal history of noncompliance with medical treatment, presenting hazards to health 04/14/2015   Uncontrolled type 2 diabetes mellitus with complication, with long-term current use of insulin 03/16/2015   Essential hypertension, benign 03/16/2015   Hyperlipidemia 07/31/2008    Past Surgical History:  Procedure Laterality Date   BREAST EXCISIONAL BIOPSY     COLONOSCOPY N/A 04/20/2017   Procedure: COLONOSCOPY;  Surgeon: Danie Binder, MD;  [facility];  Service: Endoscopy;  Laterality: N/A;  12:00   TONGUE SURGERY  10/19/1999   tongue cancer   TRACHEOSTOMY       OB  History     Gravida  1   Para  1   Term  1   Preterm      AB      Living  1      SAB      IAB      Ectopic      Multiple      Live Births  1           Family History  Problem Relation Age of Onset   Hypertension Mother    Diabetes Mother    Kidney disease Mother    Hypertension Father    Diabetes Father    Congestive Heart Failure Father    COPD Father    Hypertension Sister    Diabetes Sister    Diabetes Sister        borderline   ADD / ADHD Son    Eczema Son     Social History   Tobacco Use   Smoking status: Never   Smokeless tobacco: Never  Vaping Use   Vaping Use: Never used  Substance Use Topics   Alcohol use: No   Drug use: No    Home Medications Prior to Admission medications   Medication Sig Start Date End Date Taking? Authorizing Provider  cholecalciferol (VITAMIN D) 1000 units tablet Take 1 tablet by mouth daily. otc once daily    [provider]  Continuous Blood Gluc Sensor (DEXCOM G6 SENSOR) MISC 1 Device by Does  not apply route See admin instructions. Change every 10 days 06/21/20   Renato Shin, MD  glucose blood [facility] VERIO) test strip 1 each by Other route 2 (two) times daily. And lancets 2/day 06/17/20   Renato Shin, MD  insulin glargine-yfgn (SEMGLEE, YFGN,) 100 UNIT/ML Pen Inject 60 Units into the skin every morning. 12/23/20   Renato Shin, MD  insulin NPH Human (NOVOLIN N) 100 UNIT/ML injection 40 units each morning, and 20 units each evening. 12/22/20   Renato Shin, MD  liraglutide (VICTOZA) 18 MG/3ML SOPN Inject 1.8 mg into the skin every morning. 06/17/20   Renato Shin, MD  losartan (COZAAR) 25 MG tablet TAKE 1 TABLET BY MOUTH EVERY DAY 09/21/20   Kathyrn Drown, MD  metFORMIN (GLUCOPHAGE) 1000 MG tablet TAKE ONE TABLET BY MOUTH EVERY DAY WITH FOOD, IF TOLERATED INCREASE TO ONE TWICE A DAY 09/21/20   Kathyrn Drown, MD  rosuvastatin (CRESTOR) 20 MG tablet TAKE 1 TABLET BY MOUTH EVERY DAY 11/23/20   Kathyrn Drown, MD    Allergies    Ciprofloxacin, Sulfonamide derivatives, Ibuprofen, and Penicillins  Review of Systems   Review of Systems  Constitutional:  Negative for appetite change and fatigue.  HENT:  Negative for congestion, ear discharge and sinus pressure.   Eyes:  Negative for discharge.  Respiratory:  Negative for cough.   Cardiovascular:  Negative for chest pain.  Gastrointestinal:  Negative for abdominal pain and diarrhea.  Genitourinary:  Negative for frequency and hematuria.  Musculoskeletal:  Negative for back pain.       Right arm right leg weakness  Skin:  Negative for rash.  Neurological:  Positive for weakness. Negative for seizures and headaches.  Psychiatric/Behavioral:  Negative for hallucinations.    Physical Exam Updated Vital Signs BP 134/68   Pulse 96   Temp 98.6 F (37 C) (Oral)   Resp 20   Ht 5\' 1"  (1.549 m)   Wt 75.8 kg   LMP 07/22/2016   SpO2 100%   BMI 31.57 kg/m   Physical Exam Vitals and nursing note reviewed.  Constitutional:      Appearance: She is well-developed.  HENT:     Head: Normocephalic.     Nose: Nose normal.  Eyes:     General: No scleral icterus.    Conjunctiva/sclera: Conjunctivae normal.  Neck:     Thyroid: No thyromegaly.  Cardiovascular:     Rate and Rhythm: Normal rate and regular rhythm.     Heart sounds: No murmur heard.   No friction rub. No gallop.  Pulmonary:     Breath sounds: No stridor. No wheezing or rales.  Chest:     Chest wall: No tenderness.  Abdominal:     General: There is no distension.     Tenderness: There is no abdominal tenderness. There is no rebound.  Musculoskeletal:     Cervical back: Neck supple.     Comments: Moderate weakness of right arm and right leg.  Patient is ataxic when she walks  Lymphadenopathy:     Cervical: No cervical adenopathy.  Skin:    Findings: No erythema or rash.  Neurological:     Mental Status: She is alert and oriented to person, place, and time.      Motor: No abnormal muscle tone.     Coordination: Coordination normal.  Psychiatric:        Behavior: Behavior normal.    ED Results / Procedures / Treatments   Labs (all labs  ordered are listed, but only abnormal results are displayed) Labs Reviewed  RESP PANEL BY RT-PCR (FLU A&B, COVID) ARPGX2  CBC WITH DIFFERENTIAL/PLATELET  COMPREHENSIVE METABOLIC PANEL  CBG MONITORING, ED    EKG None  Radiology CT Head Wo Contrast  Result Date: 01/23/2021 CLINICAL DATA:  Slurred speech and right facial weakness beginning yesterday at 11 a.m. EXAM: CT HEAD WITHOUT CONTRAST TECHNIQUE: Contiguous axial images were obtained from the base of the skull through the vertex without intravenous contrast. COMPARISON:  None. FINDINGS: Brain: The brainstem, cerebellum, cerebral peduncles, thalami, basal ganglia, basilar cisterns, and ventricular system appear within normal limits. No intracranial hemorrhage, mass lesion, or acute CVA. Vascular: Advanced for age atherosclerotic calcification of the carotid siphons. Skull: Unremarkable Sinuses/Orbits: Minimal chronic left maxillary sinusitis. Other: No supplemental non-categorized findings. IMPRESSION: 1. No acute intracranial findings. 2. Advanced for age atherosclerotic calcification of the carotid siphons. 3. Minimal chronic left maxillary sinusitis. Electronically Signed   By: Van Clines M.D.   On: 01/23/2021 13:26    Procedures Procedures   Medications Ordered in ED Medications  aspirin EC tablet 325 mg (325 mg Oral Given 01/23/21 1413)    ED Course  I have reviewed the triage vital signs and the nursing notes.  Pertinent labs & imaging results that were available during my care of the patient were reviewed by me and considered in my medical decision making (see chart for details). CRITICAL CARE Performed by: Milton Ferguson Total critical care time: 45 minutes Critical care time was exclusive of separately billable procedures and treating  other patients. Critical care was necessary to treat or prevent imminent or life-threatening deterioration. Critical care was time spent personally by me on the following activities: development of treatment plan with patient and/or surrogate as well as nursing, discussions with consultants, evaluation of patient's response to treatment, examination of patient, obtaining history from patient or surrogate, ordering and performing treatments and interventions, ordering and review of laboratory studies, ordering and review of radiographic studies, pulse oximetry and re-evaluation of patient's condition.    MDM Rules/Calculators/A&P                           Patient with a stroke that is probably 106 hours old.  Patient will be admitted to medicine with neuro consult Final Clinical Impression(s) / ED Diagnoses Final diagnoses:  Cerebellar stroke, acute [facility])    Rx / DC Orders ED Discharge Orders     None        Milton Ferguson, MD 01/25/21 1645

## 2021-01-23 NOTE — ED Triage Notes (Signed)
Pt to the ED with slurred speech and right sided weakness that began yesterday at 1100.  No Facial droop or drift in any extremity with exam.

## 2021-01-23 NOTE — Plan of Care (Signed)
  Problem: Education: Goal: Knowledge of disease or condition will improve Outcome: Progressing Goal: Knowledge of patient specific risk factors will improve (INDIVIDUALIZE FOR PATIENT) - Diabetes and Hyperlipidemia Outcome: Progressing   Problem: Self-Care: Goal: Ability to participate in self-care as condition permits will improve Outcome: Progressing   Problem: Ischemic Stroke/TIA Tissue Perfusion: Goal: Complications of ischemic stroke/TIA will be minimized Outcome: Progressing   Problem: Clinical Measurements: Goal: Cardiovascular complication will be avoided Outcome: Progressing   Problem: Nutrition: Goal: Adequate nutrition will be maintained Outcome: Progressing   Problem: Coping: Goal: Level of anxiety will decrease Outcome: Progressing   Problem: Safety: Goal: Ability to remain free from injury will improve Outcome: Progressing Pt oriented on safety precautions, verbalized understanding. Call bell within reach, bed alarm on for safety.

## 2021-01-23 NOTE — H&P (Signed)
History and Physical    Cheryl Middleton DOB: 1965-06-18 DOA: 01/23/2021  PCP: Kathyrn Drown, MD   Patient coming from: Home  Chief Complaint: Right-sided paresthesias  HPI: Cheryl Middleton is a 55 y.o. female with medical history significant for type 2 diabetes, prior tongue cancer with excision, and dyslipidemia who presented to the ED with complaints of some right-sided upper and lower extremity paresthesias that began on 11/5 at approximately 11 AM.  This was of sudden onset and continues to remain into today.  She states that she felt unsteady on her feet and had some trouble with balancing as well.  She additionally states that since yesterday, she has had some mild speech deficits noted as well.  She denies any visual changes, headaches, or dizziness.  No fevers or chills noted.   ED Course: Vital signs stable and patient is afebrile.  Laboratory data within normal limits and CT head with no acute findings.  EDP had discussed with Dr. Cheral Marker with neurology who states that it would be appropriate to do usual stroke work-up with brain MRI and have teleneurology evaluation in a.m.  Review of Systems: Reviewed as noted above, otherwise negative.  Past Medical History:  Diagnosis Date   Cancer ([place])    tongue   Diabetes mellitus, type II ([place])    Hyperlipidemia    Malignant neoplasm of tongue ([place])    Tongue    Past Surgical History:  Procedure Laterality Date   BREAST EXCISIONAL BIOPSY     COLONOSCOPY N/A 04/20/2017   Procedure: COLONOSCOPY;  Surgeon: Danie Binder, MD;  Location: [facility];  Service: Endoscopy;  Laterality: N/A;  12:00   TONGUE SURGERY  10/19/1999   tongue cancer   TRACHEOSTOMY       reports that she has never smoked. She has never used smokeless tobacco. She reports that she does not drink alcohol and does not use drugs.  Allergies  Allergen Reactions   Ciprofloxacin    Sulfonamide Derivatives    Ibuprofen Rash   Penicillins  Rash    Has patient had a PCN reaction causing immediate rash, facial/tongue/throat swelling, SOB or lightheadedness with hypotension: Yes Has patient had a PCN reaction causing severe rash involving mucus membranes or skin necrosis: no Has patient had a PCN reaction that required hospitalization: yes Has patient had a PCN reaction occurring within the last 10 years: yes If all of the above answers are "NO", then may proceed with Cephalosporin use.     Family History  Problem Relation Age of Onset   Hypertension Mother    Diabetes Mother    Kidney disease Mother    Hypertension Father    Diabetes Father    Congestive Heart Failure Father    COPD Father    Hypertension Sister    Diabetes Sister    Diabetes Sister        borderline   ADD / ADHD Son    Eczema Son     Prior to Admission medications   Medication Sig Start Date End Date Taking? Authorizing Provider  cholecalciferol (VITAMIN D) 1000 units tablet Take 1 tablet by mouth daily. otc once daily    [provider]  Continuous Blood Gluc Sensor (DEXCOM G6 SENSOR) MISC 1 Device by Does not apply route See admin instructions. Change every 10 days 06/21/20   Renato Shin, MD  glucose blood [facility] VERIO) test strip 1 each by Other route 2 (two) times daily. And lancets 2/day  06/17/20   Renato Shin, MD  insulin glargine-yfgn (SEMGLEE, YFGN,) 100 UNIT/ML Pen Inject 60 Units into the skin every morning. 12/23/20   Renato Shin, MD  insulin NPH Human (NOVOLIN N) 100 UNIT/ML injection 40 units each morning, and 20 units each evening. 12/22/20   Renato Shin, MD  liraglutide (VICTOZA) 18 MG/3ML SOPN Inject 1.8 mg into the skin every morning. 06/17/20   Renato Shin, MD  losartan (COZAAR) 25 MG tablet TAKE 1 TABLET BY MOUTH EVERY DAY 09/21/20   Kathyrn Drown, MD  metFORMIN (GLUCOPHAGE) 1000 MG tablet TAKE ONE TABLET BY MOUTH EVERY DAY WITH FOOD, IF TOLERATED INCREASE TO ONE TWICE A DAY 09/21/20   Kathyrn Drown, MD   rosuvastatin (CRESTOR) 20 MG tablet TAKE 1 TABLET BY MOUTH EVERY DAY 11/23/20   Kathyrn Drown, MD    Physical Exam: Vitals:   01/23/21 1145 01/23/21 1146 01/23/21 1300 01/23/21 1330  BP:  (!) 153/79 (!) 144/74 134/68  Pulse:  (!) 104 97 96  Resp:  16 18 20   Temp:  98.6 F (37 C)    TempSrc:  Oral    SpO2:  100% 100% 100%  Weight: 75.8 kg     Height: 5\' 1"  (1.549 m)       Constitutional: NAD, calm, comfortable Vitals:   01/23/21 1145 01/23/21 1146 01/23/21 1300 01/23/21 1330  BP:  (!) 153/79 (!) 144/74 134/68  Pulse:  (!) 104 97 96  Resp:  16 18 20   Temp:  98.6 F (37 C)    TempSrc:  Oral    SpO2:  100% 100% 100%  Weight: 75.8 kg     Height: 5\' 1"  (1.549 m)      Eyes: lids and conjunctivae normal Neck: normal, supple Respiratory: clear to auscultation bilaterally. Normal respiratory effort. No accessory muscle use.  Cardiovascular: Regular rate and rhythm, no murmurs. Abdomen: no tenderness, no distention. Bowel sounds positive.  Musculoskeletal:  No edema. Skin: no rashes, lesions, ulcers.  Psychiatric: Flat affect  Labs on Admission: I have personally reviewed following labs and imaging studies  CBC: Recent Labs  Lab 01/23/21 1243  WBC 7.9  NEUTROABS 4.5  HGB 12.1  HCT 37.5  MCV 91.9  PLT 387   Basic Metabolic Panel: Recent Labs  Lab 01/23/21 1243  NA 142  K 4.6  CL 105  CO2 30  GLUCOSE 96  BUN 13  CREATININE 0.74  CALCIUM 9.1   GFR: Estimated Creatinine Clearance: 74.9 mL/min (by C-G formula based on SCr of 0.74 mg/dL). Liver Function Tests: Recent Labs  Lab 01/23/21 1243  AST 19  ALT 14  ALKPHOS 95  BILITOT 0.4  PROT 7.8  ALBUMIN 3.9   No results for input(s): LIPASE, AMYLASE in the last 168 hours. No results for input(s): AMMONIA in the last 168 hours. Coagulation Profile: No results for input(s): INR, PROTIME in the last 168 hours. Cardiac Enzymes: No results for input(s): CKTOTAL, CKMB, CKMBINDEX, TROPONINI in the last 168  hours. BNP (last 3 results) No results for input(s): PROBNP in the last 8760 hours. HbA1C: No results for input(s): HGBA1C in the last 72 hours. CBG: Recent Labs  Lab 01/23/21 1150  GLUCAP 87   Lipid Profile: No results for input(s): CHOL, HDL, LDLCALC, TRIG, CHOLHDL, LDLDIRECT in the last 72 hours. Thyroid Function Tests: No results for input(s): TSH, T4TOTAL, FREET4, T3FREE, THYROIDAB in the last 72 hours. Anemia Panel: No results for input(s): VITAMINB12, FOLATE, FERRITIN, TIBC, IRON, RETICCTPCT in the  last 72 hours. Urine analysis:    Component Value Date/Time   COLORURINE LT. YELLOW 07/31/2008 0000   APPEARANCEUR CLEAR 07/31/2008 0000   LABSPEC <=1.005 07/31/2008 0000   PHURINE 5.0 07/31/2008 0000   GLUCOSEU NEGATIVE 07/31/2008 0000   BILIRUBINUR NEGATIVE 07/31/2008 0000   KETONESUR NEGATIVE 07/31/2008 0000   UROBILINOGEN 0.2 07/31/2008 0000   NITRITE NEGATIVE 07/31/2008 0000   LEUKOCYTESUR NEGATIVE 07/31/2008 0000    Radiological Exams on Admission: CT Head Wo Contrast  Result Date: 01/23/2021 CLINICAL DATA:  Slurred speech and right facial weakness beginning yesterday at 11 a.m. EXAM: CT HEAD WITHOUT CONTRAST TECHNIQUE: Contiguous axial images were obtained from the base of the skull through the vertex without intravenous contrast. COMPARISON:  None. FINDINGS: Brain: The brainstem, cerebellum, cerebral peduncles, thalami, basal ganglia, basilar cisterns, and ventricular system appear within normal limits. No intracranial hemorrhage, mass lesion, or acute CVA. Vascular: Advanced for age atherosclerotic calcification of the carotid siphons. Skull: Unremarkable Sinuses/Orbits: Minimal chronic left maxillary sinusitis. Other: No supplemental non-categorized findings. IMPRESSION: 1. No acute intracranial findings. 2. Advanced for age atherosclerotic calcification of the carotid siphons. 3. Minimal chronic left maxillary sinusitis. Electronically Signed   By: Van Clines  M.D.   On: 01/23/2021 13:26    EKG: Independently reviewed. ST 104bpm.  Assessment/Plan Active Problems:   CVA (cerebral vascular accident) ([place])    Right-sided weakness with dysarthria suspect CVA -EDP discussed case with neurology, plans for teleneurology evaluation in a.m. -Brain MRI in a.m. -2D echocardiogram -Telemetry monitoring -FLP and hemoglobin A1c -Time-limited, gentle IV fluid -PT/OT/SLP evaluation  History of type 2 diabetes -SSI -Check hemoglobin A1c as noted above  Dyslipidemia -Continue rosuvastatin -Check FLP as noted above   DVT prophylaxis: Lovenox Code Status: Full Family Communication: Sister at bedside Disposition Plan:CVA evaluation Consults called:Dr.Lindzen by EDP Admission status: Inpatient, tele  Keath Matera D Aunesty Tyson DO [facility]  If 7PM-7AM, please contact night-coverage [email or web address]  01/23/2021, 2:54 PM

## 2021-01-23 NOTE — Progress Notes (Signed)
Pt arrived to room #332 from ED via stretcher. Pt ambulatory to bed from stretcher with standby assistance. Pt A&O x4, only complaint is that she feels her speech is not back to baseline. States still feels dizzy and not quite normal balance. Pt oriented to room and safety procedures, states understanding. VSS.

## 2021-01-23 NOTE — ED Notes (Signed)
Attempted to call report, nurse stated she was unable to take report at this time.

## 2021-01-23 NOTE — Progress Notes (Signed)
Pt's Victoza pen from home has been labeled and place in pt's med specific bin in the medication room.

## 2021-01-23 NOTE — Progress Notes (Signed)
No change in NIHSS stroke evaluation. Pt states that her right arm feels more normal than on admission but her right leg still feels heavy and "off". Pt's sister states that she feels like pt's speech is back to baseline as well. Pt denies any c/o at present.

## 2021-01-24 ENCOUNTER — Inpatient Hospital Stay (HOSPITAL_COMMUNITY): Payer: BC Managed Care – PPO

## 2021-01-24 DIAGNOSIS — I6389 Other cerebral infarction: Secondary | ICD-10-CM | POA: Diagnosis not present

## 2021-01-24 LAB — ECHOCARDIOGRAM COMPLETE
AR max vel: 1.61 cm2
AV Area VTI: 1.61 cm2
AV Area mean vel: 1.55 cm2
AV Mean grad: 4 mmHg
AV Peak grad: 7.6 mmHg
Ao pk vel: 1.38 m/s
Area-P 1/2: 5.13 cm2
Calc EF: 58.3 %
Height: 62 in
MV VTI: 2.93 cm2
S' Lateral: 2.35 cm
Single Plane A2C EF: 50.2 %
Single Plane A4C EF: 66.8 %
Weight: 2673.74 oz

## 2021-01-24 LAB — LIPID PANEL
Cholesterol: 122 mg/dL (ref 0–200)
HDL: 46 mg/dL (ref >40)
LDL Cholesterol: 45 mg/dL (ref 0–99)
Total CHOL/HDL Ratio: 2.7 RATIO
Triglycerides: 153 mg/dL — ABNORMAL HIGH (ref <150)
VLDL: 31 mg/dL (ref 0–40)

## 2021-01-24 LAB — HEMOGLOBIN A1C
Hgb A1c MFr Bld: 8.1 % — ABNORMAL HIGH (ref 4.8–5.6)
Mean Plasma Glucose: 185.77 mg/dL

## 2021-01-24 LAB — GLUCOSE, CAPILLARY
Glucose-Capillary: 192 mg/dL — ABNORMAL HIGH (ref 70–99)
Glucose-Capillary: 301 mg/dL — ABNORMAL HIGH (ref 70–99)
Glucose-Capillary: 82 mg/dL (ref 70–99)
Glucose-Capillary: 154 mg/dL — ABNORMAL HIGH (ref 70–99)

## 2021-01-24 MED ORDER — INSULIN GLARGINE-YFGN 100 UNIT/ML ~~LOC~~ SOLN
30.0000 [IU] | Freq: Every day | SUBCUTANEOUS | Status: DC
  Administered 2021-01-24 – 2021-01-25 (×2): 30 [IU] via SUBCUTANEOUS
  Filled 2021-01-24 (×4): qty 0.3

## 2021-01-24 MED ORDER — ASPIRIN 81 MG PO CHEW
81.0000 mg | CHEWABLE_TABLET | Freq: Every day | ORAL | Status: DC
  Administered 2021-01-24 – 2021-01-25 (×2): 81 mg via ORAL
  Filled 2021-01-24 (×2): qty 1

## 2021-01-24 NOTE — Progress Notes (Signed)
SLP Cancellation Note  Patient Details Name: Cheryl Middleton MRN: [id number] DOB: [birth year]   Cancelled treatment:       Reason Eval/Treat Not Completed: SLP screened, no needs identified, will sign off; SLP screened Pt in room. Pt denies any changes in swallowing, speech, language, or cognition. Pt reports that her speech is back to her baseline with mild "slurring" due to previous lingual cancer with surgery. SLE will be deferred at this time. Reconsult if indicated. SLP will sign off.   Thank you,  Genene Churn, [place]  [place] 01/24/2021, 2:22 PM

## 2021-01-24 NOTE — Consult Note (Addendum)
I connected with  Cheryl Middleton on 01/24/21 by a video enabled telemedicine application and verified that I am speaking with the correct person using two identifiers.   I discussed the limitations of evaluation and management by telemedicine. The patient expressed understanding and agreed to proceed.    Neurology Consultation Reason for Consult: Right-sided paresthesias Referring Physician: Dr Heath Lark  CC: Right-sided paresthesias  History is obtained from: patient  HPI: Cheryl Middleton is a 55 y.o. female with past medical history of type 2 diabetes, prior tongue cancer with excision, dyslipidemia who presented to the emergency room for right sided weakness and paresthesias. Last known normal 01/21/2021 when she went to sleep. States she woke up on 01/22/2021 and felt weak on right side as well as paresthesias. Also reports feeling unsteady on her feet and off balance. Eventually came to ER on 01/23/2021 around noon. Stroke alter was not called as patient was outside 24 hour window. MRI brain was obtained which showed pontine stroke. Neurology was consulted for further management.   ROS: All other systems reviewed and negative except as noted in the HPI.  Past Medical History:  Diagnosis Date   Cancer ([place])    tongue   Diabetes mellitus, type II ([place])    Hyperlipidemia    Malignant neoplasm of tongue ([place])    Tongue    Family History  Problem Relation Age of Onset   Hypertension Mother    Diabetes Mother    Kidney disease Mother    Hypertension Father    Diabetes Father    Congestive Heart Failure Father    COPD Father    Hypertension Sister    Diabetes Sister    Diabetes Sister        borderline   ADD / ADHD Son    Eczema Son     Social History:  reports that she has never smoked. She has never used smokeless tobacco. She reports that she does not drink alcohol and does not use drugs.  Medications Prior to Admission  Medication Sig Dispense Refill Last Dose    Continuous Blood Gluc Sensor (DEXCOM G6 SENSOR) MISC 1 Device by Does not apply route See admin instructions. Change every 10 days 9 each 3    Continuous Blood Gluc Transmit (DEXCOM G6 TRANSMITTER) MISC See admin instructions.      insulin NPH Human (NOVOLIN N) 100 UNIT/ML injection 40 units each morning, and 20 units each evening. 20 mL 11 01/22/2021   liraglutide (VICTOZA) 18 MG/3ML SOPN Inject 1.8 mg into the skin every morning. 27 mL 3 01/22/2021   losartan (COZAAR) 25 MG tablet TAKE 1 TABLET BY MOUTH EVERY DAY 90 tablet 1 01/22/2021   metFORMIN (GLUCOPHAGE) 1000 MG tablet TAKE ONE TABLET BY MOUTH EVERY DAY WITH FOOD, IF TOLERATED INCREASE TO ONE TWICE A DAY (Patient taking differently: Take 1,000 mg by mouth daily with breakfast. Take one tablet by mouth every day with food, if tolerated increase to one twice a day) 180 tablet 1 01/22/2021   rosuvastatin (CRESTOR) 20 MG tablet TAKE 1 TABLET BY MOUTH EVERY DAY 90 tablet 0 01/22/2021   cholecalciferol (VITAMIN D) 1000 units tablet Take 1 tablet by mouth daily. otc once daily (Patient not taking: Reported on 01/23/2021)   Not Taking   glucose blood (ONETOUCH VERIO) test strip 1 each by Other route 2 (two) times daily. And lancets 2/day 200 each 3    insulin glargine-yfgn (SEMGLEE, YFGN,) 100 UNIT/ML Pen Inject 60 Units into  the skin every morning. (Patient not taking: No sig reported) 60 mL 3 Not Taking      Exam: Current vital signs: BP 131/76 (BP Location: Left Arm)   Pulse 91   Temp 98.2 F (36.8 C) (Oral)   Resp 20   Ht 5\' 2"  (1.575 m)   Wt 75.8 kg   LMP 07/22/2016   SpO2 98%   BMI 30.56 kg/m  Vital signs in last 24 hours: Temp:  [97.1 F (36.2 C)-98.4 F (36.9 C)] 98.2 F (36.8 C) (11/07 0600) Pulse Rate:  [89-101] 91 (11/07 0800) Resp:  [16-20] 20 (11/07 0800) BP: (115-144)/(63-77) 131/76 (11/07 0800) SpO2:  [97 %-100 %] 98 % (11/07 0800)   Physical Exam  Constitutional: Appears well-developed and well-nourished.  Psych:  Affect appropriate to situation Neuro: Aox3, PERLA, EOMI, no facial droop, has area of numbness in right cheek due to tongue cancer resection, no new numbness, no visual neglect, antigravity strength without any drift in all extremities, denies any sensory change  I have reviewed labs in epic and the results pertinent to this consultation are: CBC:  Recent Labs  Lab 01/23/21 1243 01/23/21 1535  WBC 7.9 8.2  NEUTROABS 4.5  --   HGB 12.1 11.9*  HCT 37.5 37.5  MCV 91.9 92.1  PLT 257 458    Basic Metabolic Panel:  Lab Results  Component Value Date   NA 142 01/23/2021   K 4.6 01/23/2021   CO2 30 01/23/2021   GLUCOSE 96 01/23/2021   BUN 13 01/23/2021   CREATININE 0.67 01/23/2021   CALCIUM 9.1 01/23/2021   GFRNONAA >60 01/23/2021   GFRAA 104 03/11/2020   Lipid Panel:  Lab Results  Component Value Date   LDLCALC 45 01/24/2021   HgbA1c:  Lab Results  Component Value Date   HGBA1C 8.2 (A) 12/22/2020   Urine Drug Screen: No results found for: LABOPIA, COCAINSCRNUR, LABBENZ, AMPHETMU, THCU, LABBARB  Alcohol Level No results found for: ETH   I have reviewed the images obtained: MRI brain without contrast 01/24/2021: Two foci of restricted diffusion within the pons, one on each side,most consistent with acute/subacute infarcts. No mass effect or hemorrhagic transformation.     ASSESSMENT/PLAN: 55yo F with sudden right sided weakness and paresthesias.   Acute pontine stroke - No tpa as outside window, no thrombectomy as no LVO - Etiology: suspected embolic - Triglycerides 099, normal LDL, A1c 8.1 - Stroke risk factors: HLD, DM  Recommendations - Will obtain MRA head and neck to look for atheroembolic etiology - will obtain TTE for cardioembolic etiology - start ASA 81mg  daily and rosuvastatin 20mg  daily for sec stroke prevention - If negative, will need 30 day holter monitor at discharge - Low suspicion for hypercoag state and metastatic disease ( h/o tongue cancer s/p  resection) as patient reports local disease but can be considered as outpatient if holter negative - Discussed mri findings with patient, current management and plan for tests, medications, as well as disposition - f/u with neuro in 4-6 weeks  Thank you for allowing [place] to participate in the care of this patient. If you have any further questions, please contact  me or neurohospitalist.    I have spent a total of  75 minutes with the patient reviewing hospital notes,  test results, labs and examining the patient as well as establishing an assessment and plan that was discussed personally with the patient and sister at bedside.  > 50% of time was spent in direct patient  care.    Zeb Comfort Epilepsy [facility]

## 2021-01-24 NOTE — Evaluation (Signed)
Occupational Therapy Evaluation Patient Details Name: Cheryl Middleton MRN: [id number] DOB: Oct 23, 1965 Today's Date: 01/24/2021   History of Present Illness Cheryl Middleton is a 55 y.o. female with medical history significant for type 2 diabetes, prior tongue cancer with excision, and dyslipidemia who presented to the ED with complaints of some right-sided upper and lower extremity paresthesias that began on 11/5 at approximately 11 AM.   Clinical Impression   Patient sitting up in recliner upon therapy arrival and agreeable to participate in OT evaluation. Patient is currently presenting at baseline for ADL completion. She does not demonstrate any significant upper extremity weakness or lack of coordination. No follow OT needs are present at this time. Thank you for the referral.       Recommendations for follow up therapy are one component of a multi-disciplinary discharge planning process, led by the attending physician.  Recommendations may be updated based on patient status, additional functional criteria and insurance authorization.   Follow Up Recommendations  No OT follow up    Assistance Recommended at Discharge None     Equipment Recommendations  None recommended by OT       Precautions / Restrictions Precautions Precautions: None      Mobility         Transfers Overall transfer level: Independent Equipment used: None                      Balance Overall balance assessment: No apparent balance deficits (not formally assessed)           ADL either performed or assessed with clinical judgement   ADL Overall ADL's : Independent;At baseline           Vision Baseline Vision/History: 1 Wears glasses (for reading) Ability to See in Adequate Light: 0 Adequate Patient Visual Report: No change from baseline       Perception Perception Perception: Within Functional Limits   Praxis Praxis Praxis: Intact    Pertinent Vitals/Pain Pain  Assessment: No/denies pain     Hand Dominance Right   Extremity/Trunk Assessment Upper Extremity Assessment Upper Extremity Assessment: Overall WFL for tasks assessed   Lower Extremity Assessment Lower Extremity Assessment: Defer to PT evaluation       Communication Communication Communication: Other (comment) (baseline speech is with a slight slur)   Cognition Arousal/Alertness: Awake/alert Behavior During Therapy: WFL for tasks assessed/performed Overall Cognitive Status: Within Functional Limits for tasks assessed                  Home Living Family/patient expects to be discharged to:: Private residence Living Arrangements: Children;Parent;Other relatives Available Help at Discharge: Family;Available 24 hours/day Type of Home: House Home Access: Stairs to enter [employer] of Steps: 1 Entrance Stairs-Rails: None Home Layout: One level     Bathroom Shower/Tub: Tub/shower unit         Home Equipment: Shower seat;Cane - single [employer] (2 wheels)          Prior Functioning/Environment Prior Level of Function : Independent/Modified Independent             Mobility Comments: Patient did not use any AD for mobility at home.          OT Problem List: Decreased strength       AM-PAC OT "6 Clicks" Daily Activity     Outcome Measure Help from another person eating meals?: None Help from another person taking care of personal grooming?: None Help from another person  toileting, which includes using toliet, bedpan, or urinal?: None Help from another person bathing (including washing, rinsing, drying)?: None Help from another person to put on and taking off regular upper body clothing?: None Help from another person to put on and taking off regular lower body clothing?: None 6 Click Score: 24   End of Session Equipment Utilized During Treatment:  (None)  Activity Tolerance: Patient tolerated treatment well Patient left: in  chair;with call bell/phone within reach  OT Visit Diagnosis: Muscle weakness (generalized) (M62.81)                Time: 2417-5301 OT Time Calculation (min): 12 min Charges:  OT General Charges $OT Visit: 1 Visit OT Evaluation $OT Eval Low Complexity: 1 Low  Ailene Ravel, OTR/L,CBIS  [phone]   Erico Stan, Clarene Duke 01/24/2021, 9:45 AM

## 2021-01-24 NOTE — Progress Notes (Signed)
*  PRELIMINARY RESULTS* Echocardiogram 2D Echocardiogram has been performed.  Cheryl Middleton 01/24/2021, 2:01 PM

## 2021-01-24 NOTE — Evaluation (Addendum)
Physical Therapy Evaluation Patient Details Name: Cheryl Middleton MRN: [id number] DOB: [birth year] Today's Date: 01/24/2021  History of Present Illness  Cheryl Middleton is a 55 y.o. female with medical history significant for type 2 diabetes, prior tongue cancer with excision, and dyslipidemia who presented to the ED with complaints of some right-sided upper and lower extremity paresthesias that began on 11/5 at approximately 11 AM.  This was of sudden onset and continues to remain into today.  She states that she felt unsteady on her feet and had some trouble with balancing as well.  She additionally states that since yesterday, she has had some mild speech deficits noted as well.  She denies any visual changes, headaches, or dizziness.  No fevers or chills noted.   Clinical Impression  Patient presents in chair awake, alert, and agreeable for therapy. Patient functioning near baseline for functional mobility and gait other than reports of very slight remaining weakness in R LE. Patient demonstrates independence for performing bed mobility. Patient ambulated in hallway, demonstrated excellent return for ambulating up/down incline in hallway w/ good arm swing and a swift cadence. Patient tolerated sitting up in chair after therapy. Patient discharged to care of nursing for ambulation daily as tolerated for length of stay.        Recommendations for follow up therapy are one component of a multi-disciplinary discharge planning process, led by the attending physician.  Recommendations may be updated based on patient status, additional functional criteria and insurance authorization.  Follow Up Recommendations No PT follow up    Assistance Recommended at Discharge None  Functional Status Assessment Patient has had a recent decline in their functional status and demonstrates the ability to make significant improvements in function in a reasonable and predictable amount of time.  Equipment  Recommendations  None recommended by PT    Recommendations for Other Services       Precautions / Restrictions Precautions Precautions: Fall Restrictions Weight Bearing Restrictions: No      Mobility  Bed Mobility Overal bed mobility: Independent                  Transfers Overall transfer level: Independent Equipment used: None                    Ambulation/Gait Ambulation/Gait assistance: Independent Gait Distance (Feet): 200 Feet Assistive device: None   Gait velocity: near baseline     General Gait Details: near baseline, demonstrated good base of support and good arm swing  Stairs            Wheelchair Mobility    Modified Rankin (Stroke Patients Only)       Balance Overall balance assessment: Independent                                           Pertinent Vitals/Pain Pain Assessment: No/denies pain    Home Living Family/patient expects to be discharged to:: Private residence Living Arrangements: Children;Parent;Other relatives Available Help at Discharge: Family;Available 24 hours/day Type of Home: House Home Access: Stairs to enter Entrance Stairs-Rails: None Entrance Stairs-Number of Steps: 1   Home Layout: One level Home Equipment: Shower seat;Cane - single point;Rolling Walker (2 wheels)      Prior Function Prior Level of Function : Independent/Modified Independent             Mobility Comments: Independent community  ambulator, drives ADLs Comments: Independent     Hand Dominance        Extremity/Trunk Assessment   Upper Extremity Assessment Upper Extremity Assessment: Overall WFL for tasks assessed    Lower Extremity Assessment Lower Extremity Assessment: Generalized weakness       Communication   Communication: No difficulties  Cognition Arousal/Alertness: Awake/alert Behavior During Therapy: WFL for tasks assessed/performed Overall Cognitive Status: Within Functional Limits  for tasks assessed                                          General Comments      Exercises     Assessment/Plan    PT Assessment Patient does not need any further PT services  PT Problem List         PT Treatment Interventions      PT Goals (Current goals can be found in the Care Plan section)  Acute Rehab PT Goals Patient Stated Goal: Return home PT Goal Formulation: With patient Time For Goal Achievement: 01/24/21 Potential to Achieve Goals: Good    Frequency     Barriers to discharge        Co-evaluation               AM-PAC PT "6 Clicks" Mobility  Outcome Measure Help needed turning from your back to your side while in a flat bed without using bedrails?: None Help needed moving from lying on your back to sitting on the side of a flat bed without using bedrails?: None Help needed moving to and from a bed to a chair (including a wheelchair)?: None Help needed standing up from a chair using your arms (e.g., wheelchair or bedside chair)?: None Help needed to walk in hospital room?: None Help needed climbing 3-5 steps with a railing? : None 6 Click Score: 24    End of Session   Activity Tolerance: Patient tolerated treatment well Patient left: in chair;with call bell/phone within reach Nurse Communication: Mobility status PT Visit Diagnosis: Unsteadiness on feet (R26.81);Other abnormalities of gait and mobility (R26.89);Muscle weakness (generalized) (M62.81)    Time: 7048-8891 PT Time Calculation (min) (ACUTE ONLY): 12 min   Charges:   PT Evaluation $PT Eval Low Complexity: 1 Low PT Treatments $Therapeutic Activity: 8-22 mins          Cassie Jones, SPT  During this treatment session, the therapist was present, participating in and directing the treatment.  3:55 PM, 01/24/21 Lonell Grandchild, MPT Physical Therapist with [facility] [phone] office [phone] mobile phone

## 2021-01-24 NOTE — Discharge Summary (Addendum)
Physician Discharge Summary  Cheryl Middleton QHU:[id number] DOB: [birth year] DOA: 01/23/2021  PCP: Kathyrn Drown, MD  Admit date: 01/23/2021 Discharge date: 01/25/2021  Admitted From: Disposition:   Recommendations for Outpatient Follow-up:  Follow up with PCP in 1 weeks Patient will need to follow up with neurology in 4 to 6 weeks.  Home Health:  Discharge Condition: Fair   CODE STATUS: Full Code    Brief Hospitalization Summary: Cheryl Middleton is a 55 y.o. female with medical history significant for type 2 diabetes, prior tongue cancer with excision, and dyslipidemia who presented to the ED with complaints of some right-sided upper and lower extremity paresthesias that began on 11/5 at approximately 11 AM.  At presentation, patient endorsed instability and slurred speech.  Head CT showed no acute intracranial findings.  Subsequent MRI head showed Two foci of restricted diffusion within the pons, one on each side, most consistent with acute/subacute infarcts.  PT/OT evaluated patient and had no recommendations for the patient.  Neurology was consulted through telemedicine.  It was recommended the patient be started on aspirin and that additional work-up be done.  MRI head and neck and echocardiogram were ordered.  MRI head and neck showed moderate narrowing of the bilateral cavernous and supraclinoid ICAs, as well as mild narrowing in the proximal left A1.  Echocardiogram showed EF of 60 to 65%.  Mild calcification of the aortic valve without regurgitation was also noted on echocardiogram.  Patient endorses improvement in her symptoms throughout admission.  Neurology also recommended patient be started on Eliquis for anticoagulation.   Discharge Diagnoses:  Active Problems:   CVA (cerebral vascular accident) [facility])   Discharge Instructions: Discharge Instructions     Diet - low sodium heart healthy   Complete by: As directed    Increase activity slowly   Complete by: As directed        Allergies as of 01/25/2021       Reactions   Ciprofloxacin    Sulfonamide Derivatives    Ibuprofen Rash   Penicillins Rash   Has patient had a PCN reaction causing immediate rash, facial/tongue/throat swelling, SOB or lightheadedness with hypotension: Yes Has patient had a PCN reaction causing severe rash involving mucus membranes or skin necrosis: no Has patient had a PCN reaction that required hospitalization: yes Has patient had a PCN reaction occurring within the last 10 years: yes If all of the above answers are "NO", then may proceed with Cephalosporin use.        Medication List     TAKE these medications    aspirin 81 MG chewable tablet Chew 1 tablet (81 mg total) by mouth daily.   cholecalciferol 1000 units tablet Commonly known as: VITAMIN D Take 1 tablet by mouth daily. otc once daily   clopidogrel 75 MG tablet Commonly known as: PLAVIX Take 1 tablet (75 mg total) by mouth daily for 21 days.   clopidogrel 75 MG tablet Commonly known as: Plavix Take 1 tablet (75 mg total) by mouth daily for 21 days.   Dexcom G6 Sensor Misc 1 Device by Does not apply route See admin instructions. Change every 10 days   Dexcom G6 Transmitter Misc See admin instructions.   insulin glargine-yfgn 100 UNIT/ML Pen Commonly known as: Semglee (yfgn) Inject 60 Units into the skin every morning.   insulin NPH Human 100 UNIT/ML injection Commonly known as: NovoLIN N 40 units each morning, and 20 units each evening.   losartan 25 MG tablet Commonly known  as: COZAAR TAKE 1 TABLET BY MOUTH EVERY DAY   metFORMIN 1000 MG tablet Commonly known as: GLUCOPHAGE TAKE ONE TABLET BY MOUTH EVERY DAY WITH FOOD, IF TOLERATED INCREASE TO ONE TWICE A DAY What changed: See the new instructions.   OneTouch Verio test strip Generic drug: glucose blood 1 each by Other route 2 (two) times daily. And lancets 2/day   rosuvastatin 20 MG tablet Commonly known as: CRESTOR TAKE 1 TABLET BY  MOUTH EVERY DAY   Victoza 18 MG/3ML Sopn Generic drug: liraglutide Inject 1.8 mg into the skin every morning.        Follow-up Information     Luking, Elayne Snare, MD. Schedule an appointment as soon as possible for a visit in 1 week(s).   Specialty: Family Medicine Contact information: [place] [address] [phone]         Phillips Odor, MD. Schedule an appointment as soon as possible for a visit in 4 week(s).   Specialty: Neurology Contact information: [address] [place] [address] [phone]                Allergies  Allergen Reactions   Ciprofloxacin    Sulfonamide Derivatives    Ibuprofen Rash   Penicillins Rash    Has patient had a PCN reaction causing immediate rash, facial/tongue/throat swelling, SOB or lightheadedness with hypotension: Yes Has patient had a PCN reaction causing severe rash involving mucus membranes or skin necrosis: no Has patient had a PCN reaction that required hospitalization: yes Has patient had a PCN reaction occurring within the last 10 years: yes If all of the above answers are "NO", then may proceed with Cephalosporin use.    Allergies as of 01/25/2021       Reactions   Ciprofloxacin    Sulfonamide Derivatives    Ibuprofen Rash   Penicillins Rash   Has patient had a PCN reaction causing immediate rash, facial/tongue/throat swelling, SOB or lightheadedness with hypotension: Yes Has patient had a PCN reaction causing severe rash involving mucus membranes or skin necrosis: no Has patient had a PCN reaction that required hospitalization: yes Has patient had a PCN reaction occurring within the last 10 years: yes If all of the above answers are "NO", then may proceed with Cephalosporin use.        Medication List     TAKE these medications    aspirin 81 MG chewable tablet Chew 1 tablet (81 mg total) by mouth daily.   cholecalciferol 1000 units tablet Commonly known as: VITAMIN D Take 1  tablet by mouth daily. otc once daily   clopidogrel 75 MG tablet Commonly known as: PLAVIX Take 1 tablet (75 mg total) by mouth daily for 21 days.   clopidogrel 75 MG tablet Commonly known as: Plavix Take 1 tablet (75 mg total) by mouth daily for 21 days.   Dexcom G6 Sensor Misc 1 Device by Does not apply route See admin instructions. Change every 10 days   Dexcom G6 Transmitter Misc See admin instructions.   insulin glargine-yfgn 100 UNIT/ML Pen Commonly known as: Semglee (yfgn) Inject 60 Units into the skin every morning.   insulin NPH Human 100 UNIT/ML injection Commonly known as: NovoLIN N 40 units each morning, and 20 units each evening.   losartan 25 MG tablet Commonly known as: COZAAR TAKE 1 TABLET BY MOUTH EVERY DAY   metFORMIN 1000 MG tablet Commonly known as: GLUCOPHAGE TAKE ONE TABLET BY MOUTH EVERY DAY WITH FOOD, IF TOLERATED  INCREASE TO ONE TWICE A DAY What changed: See the new instructions.   OneTouch Verio test strip Generic drug: glucose blood 1 each by Other route 2 (two) times daily. And lancets 2/day   rosuvastatin 20 MG tablet Commonly known as: CRESTOR TAKE 1 TABLET BY MOUTH EVERY DAY   Victoza 18 MG/3ML Sopn Generic drug: liraglutide Inject 1.8 mg into the skin every morning.        Procedures/Studies: CT Head Wo Contrast  Result Date: 01/23/2021 CLINICAL DATA:  Slurred speech and right facial weakness beginning yesterday at 11 a.m. EXAM: CT HEAD WITHOUT CONTRAST TECHNIQUE: Contiguous axial images were obtained from the base of the skull through the vertex without intravenous contrast. COMPARISON:  None. FINDINGS: Brain: The brainstem, cerebellum, cerebral peduncles, thalami, basal ganglia, basilar cisterns, and ventricular system appear within normal limits. No intracranial hemorrhage, mass lesion, or acute CVA. Vascular: Advanced for age atherosclerotic calcification of the carotid siphons. Skull: Unremarkable Sinuses/Orbits: Minimal chronic  left maxillary sinusitis. Other: No supplemental non-categorized findings. IMPRESSION: 1. No acute intracranial findings. 2. Advanced for age atherosclerotic calcification of the carotid siphons. 3. Minimal chronic left maxillary sinusitis. Electronically Signed   By: Van Clines M.D.   On: 01/23/2021 13:26   MR ANGIO HEAD WO CONTRAST  Result Date: 01/24/2021 CLINICAL DATA:  Neuro deficit, acute infarct seen on 01/24/2021 MRI brain EXAM: MRA NECK WITHOUT CONTRAST MRA HEAD WITHOUT CONTRAST TECHNIQUE: Angiographic images of the Circle of Willis were acquired using MRA technique without intravenous contrast. COMPARISON:  No prior MRA, correlation is made with 01/24/2021 MRI brain. FINDINGS: MRA NECK FINDINGS No stenosis (greater than 50%), occlusion, or dissection in the bilateral carotid and vertebral arteries. Normal aortic branching. MRA HEAD FINDINGS Moderate narrowing of the bilateral cavernous and supraclinoid ICAs (series 5, image 114). Both internal carotid arteries are otherwise patent to the termini. A1 segments patent, with mild narrowing in the proximal left A1. Normal anterior communicating artery. Anterior cerebral arteries are patent to their distal aspects. No M1 stenosis or occlusion. Normal MCA bifurcations. Distal MCA branches perfused and symmetric. Vertebral arteries widely patent to the vertebrobasilar junction without stenosis. Basilar patent to its distal aspect. Superior cerebral arteries patent bilaterally. PCAs well perfused to their distal aspects without stenosis. The bilateral posterior communicating arteries are not visualized. IMPRESSION: 1. No intracranial large vessel occlusion. There is moderate narrowing of the bilateral cavernous and supraclinoid ICAs, as well as mild narrowing in the proximal left A1. 2. No hemodynamically significant stenosis in the neck. Electronically Signed   By: Merilyn Baba M.D.   On: 01/24/2021 18:13   MR ANGIO NECK WO CONTRAST  Result Date:  01/24/2021 CLINICAL DATA:  Neuro deficit, acute infarct seen on 01/24/2021 MRI brain EXAM: MRA NECK WITHOUT CONTRAST MRA HEAD WITHOUT CONTRAST TECHNIQUE: Angiographic images of the Circle of Willis were acquired using MRA technique without intravenous contrast. COMPARISON:  No prior MRA, correlation is made with 01/24/2021 MRI brain. FINDINGS: MRA NECK FINDINGS No stenosis (greater than 50%), occlusion, or dissection in the bilateral carotid and vertebral arteries. Normal aortic branching. MRA HEAD FINDINGS Moderate narrowing of the bilateral cavernous and supraclinoid ICAs (series 5, image 114). Both internal carotid arteries are otherwise patent to the termini. A1 segments patent, with mild narrowing in the proximal left A1. Normal anterior communicating artery. Anterior cerebral arteries are patent to their distal aspects. No M1 stenosis or occlusion. Normal MCA bifurcations. Distal MCA branches perfused and symmetric. Vertebral arteries widely patent to the vertebrobasilar junction  without stenosis. Basilar patent to its distal aspect. Superior cerebral arteries patent bilaterally. PCAs well perfused to their distal aspects without stenosis. The bilateral posterior communicating arteries are not visualized. IMPRESSION: 1. No intracranial large vessel occlusion. There is moderate narrowing of the bilateral cavernous and supraclinoid ICAs, as well as mild narrowing in the proximal left A1. 2. No hemodynamically significant stenosis in the neck. Electronically Signed   By: Merilyn Baba M.D.   On: 01/24/2021 18:13   MR BRAIN WO CONTRAST  Result Date: 01/24/2021 CLINICAL DATA:  Neuro deficit, acute, stroke suspected. EXAM: MRI HEAD WITHOUT CONTRAST TECHNIQUE: Multiplanar, multiecho pulse sequences of the brain and surrounding structures were obtained without intravenous contrast. COMPARISON:  Head CT January 23, 2021. FINDINGS: Brain: Two foci of restricted diffusion are seen within the pons, one on each side,  most consistent with acute/subacute infarcts. A few foci of T2 hyperintensity are seen within the white matter of the bilateral frontal lobes nonspecific most likely related to early chronic microangiopathy. No hemorrhage, hydrocephalus, extra-axial collection mass lesion. Vascular: Normal flow voids. Skull and upper cervical spine: Normal marrow signal. Sinuses/Orbits: Bilateral lens surgery. Paranasal sinuses are clear. Other: Postsurgical changes from right-sided partial glossectomy and resection of the right submandibular gland. IMPRESSION: Two foci of restricted diffusion within the pons, one on each side, most consistent with acute/subacute infarcts. No mass effect or hemorrhagic transformation. Electronically Signed   By: Pedro Earls M.D.   On: 01/24/2021 09:25   MM 3D SCREEN BREAST BILATERAL  Result Date: 01/17/2021 CLINICAL DATA:  Screening. EXAM: DIGITAL SCREENING BILATERAL MAMMOGRAM WITH TOMOSYNTHESIS AND CAD TECHNIQUE: Bilateral screening digital craniocaudal and mediolateral oblique mammograms were obtained. Bilateral screening digital breast tomosynthesis was performed. The images were evaluated with computer-aided detection. COMPARISON:  Previous exam(s). ACR Breast Density Category d: The breast tissue is extremely dense, which lowers the sensitivity of mammography FINDINGS: There are no findings suspicious for malignancy. IMPRESSION: No mammographic evidence of malignancy. A result letter of this screening mammogram will be mailed directly to the patient. RECOMMENDATION: Screening mammogram in one year. (Code:SM-B-01Y) BI-RADS CATEGORY  1: Negative. Electronically Signed   By: Evangeline Dakin M.D.   On: 01/17/2021 11:28   ECHOCARDIOGRAM COMPLETE  Result Date: 01/24/2021    ECHOCARDIOGRAM REPORT   Patient Name:   Cheryl Middleton Date of Exam: 01/24/2021 Medical Rec #:  [id number]        Height:       62.0 in Accession #:    [id number]       Weight:       167.1 lb Date of  Birth:  1966/01/13       BSA:          1.771 m Patient Age:    26 years         BP:           121/65 mmHg Patient Gender: F                HR:           89 bpm. Exam Location:  [facility] Procedure: 2D Echo, Cardiac Doppler and Color Doppler Indications:    Stroke  History:        Patient has no prior history of Echocardiogram examinations.                 Stroke; Risk Factors:Hypertension, Diabetes and Dyslipidemia.  Sonographer:    Wenda Low Referring Phys: [id number] [place]  1. Left ventricular ejection fraction, by estimation, is 60 to 65%. Left ventricular ejection fraction by 2D MOD biplane is 58.3 %. The left ventricle has normal function. The left ventricle has no regional wall motion abnormalities. Left ventricular diastolic parameters are consistent with Grade I diastolic dysfunction (impaired relaxation).  2. Right ventricular systolic function is normal. The right ventricular size is normal. There is normal pulmonary artery systolic pressure. The estimated right ventricular systolic pressure is 44.3 mmHg.  3. The mitral valve is normal in structure. No evidence of mitral valve regurgitation. No evidence of mitral stenosis.  4. The aortic valve is tricuspid. There is mild calcification of the aortic valve. There is mild thickening of the aortic valve. Aortic valve regurgitation is not visualized. No aortic stenosis is present. Comparison(s): No prior Echocardiogram. FINDINGS  Left Ventricle: Basal Septal hypertrophy noted. Left ventricular ejection fraction, by estimation, is 60 to 65%. Left ventricular ejection fraction by 2D MOD biplane is 58.3 %. The left ventricle has normal function. The left ventricle has no regional wall motion abnormalities. The left ventricular internal cavity size was small. There is no left ventricular hypertrophy. Left ventricular diastolic parameters are consistent with Grade I diastolic dysfunction (impaired relaxation). Right Ventricle: The right  ventricular size is normal. No increase in right ventricular wall thickness. Right ventricular systolic function is normal. There is normal pulmonary artery systolic pressure. The tricuspid regurgitant velocity is 2.16 m/s, and  with an assumed right atrial pressure of 3 mmHg, the estimated right ventricular systolic pressure is 15.4 mmHg. Left Atrium: Left atrial size was normal in size. Right Atrium: Right atrial size was normal in size. Pericardium: There is no evidence of pericardial effusion. Mitral Valve: The mitral valve is normal in structure. No evidence of mitral valve regurgitation. No evidence of mitral valve stenosis. MV peak gradient, 3.4 mmHg. The mean mitral valve gradient is 1.0 mmHg. Tricuspid Valve: The tricuspid valve is normal in structure. Tricuspid valve regurgitation is not demonstrated. No evidence of tricuspid stenosis. Aortic Valve: The aortic valve is tricuspid. There is mild calcification of the aortic valve. There is mild thickening of the aortic valve. Aortic valve regurgitation is not visualized. No aortic stenosis is present. Aortic valve mean gradient measures 4.0 mmHg. Aortic valve peak gradient measures 7.6 mmHg. Aortic valve area, by VTI measures 1.61 cm. Pulmonic Valve: The pulmonic valve was not well visualized. Pulmonic valve regurgitation is not visualized. No evidence of pulmonic stenosis. Aorta: The aortic root is normal in size and structure. IAS/Shunts: The atrial septum is grossly normal.  LEFT VENTRICLE PLAX 2D                        Biplane EF (MOD) LVIDd:         3.50 cm         LV Biplane EF:   Left LVIDs:         2.35 cm                          ventricular LV PW:         1.00 cm                          ejection LV IVS:        0.90 cm  fraction by LVOT diam:     1.90 cm                          2D MOD LV SV:         48                               biplane is LV SV Index:   27                               58.3 %. LVOT Area:     2.84 cm                                 Diastology                                LV e' medial:    4.90 cm/s LV Volumes (MOD)               LV E/e' medial:  13.7 LV vol d, MOD    41.8 ml       LV e' lateral:   7.07 cm/s A2C:                           LV E/e' lateral: 9.5 LV vol d, MOD    69.0 ml A4C: LV vol s, MOD    20.8 ml A2C: LV vol s, MOD    22.9 ml A4C: LV SV MOD A2C:   21.0 ml LV SV MOD A4C:   69.0 ml LV SV MOD BP:    31.6 ml RIGHT VENTRICLE RV Basal diam:  2.80 cm RV Mid diam:    2.60 cm RV S prime:     9.14 cm/s TAPSE (M-mode): 2.2 cm LEFT ATRIUM             Index        RIGHT ATRIUM           Index LA diam:        2.90 cm 1.64 cm/m   RA Area:     10.40 cm LA Vol (A2C):   23.6 ml 13.32 ml/m  RA Volume:   19.20 ml  10.84 ml/m LA Vol (A4C):   38.2 ml 21.57 ml/m LA Biplane Vol: 31.9 ml 18.01 ml/m  AORTIC VALVE                    PULMONIC VALVE AV Area (Vmax):    1.61 cm     PV Vmax:       0.77 m/s AV Area (Vmean):   1.55 cm     PV Peak grad:  2.4 mmHg AV Area (VTI):     1.61 cm AV Vmax:           138.00 cm/s AV Vmean:          93.700 cm/s AV VTI:            0.300 m AV Peak Grad:      7.6 mmHg AV Mean Grad:      4.0 mmHg LVOT Vmax:         78.30 cm/s LVOT Vmean:  51.200 cm/s LVOT VTI:          0.170 m LVOT/AV VTI ratio: 0.57  AORTA Ao Root diam: 2.60 cm MITRAL VALVE               TRICUSPID VALVE MV Area (PHT): 5.13 cm    TR Peak grad:   18.7 mmHg MV Area VTI:   2.93 cm    TR Vmax:        216.00 cm/s MV Peak grad:  3.4 mmHg MV Mean grad:  1.0 mmHg    SHUNTS MV Vmax:       0.92 m/s    Systemic VTI:  0.17 m MV Vmean:      52.4 cm/s   Systemic Diam: 1.90 cm MV Decel Time: 148 msec MV E velocity: 67.30 cm/s MV A velocity: 88.70 cm/s MV E/A ratio:  0.76 Rudean Haskell MD Electronically signed by Rudean Haskell MD Signature Date/Time: 01/24/2021/3:34:17 PM    Final      Subjective:  Patient alert and oriented x3 this morning.  She states that she feels well this morning.  Patient endorses  improvement in her in her speech.  Patient endorses improvement in strength.  No other neurological deficits reported.  Patient denies chest pains, palpitations, and headaches.  Discharge Exam: Vitals:   01/24/21 2121 01/25/21 0518  BP: 119/75 115/71  Pulse: 95 94  Resp: 16 20  Temp: 98.4 F (36.9 C) 98.2 F (36.8 C)  SpO2: 98% 98%   Vitals:   01/24/21 1456 01/24/21 1600 01/24/21 2121 01/25/21 0518  BP: 120/62 134/78 119/75 115/71  Pulse: 89 98 95 94  Resp: 17 18 16 20   Temp: 98.5 F (36.9 C) 98.2 F (36.8 C) 98.4 F (36.9 C) 98.2 F (36.8 C)  TempSrc: Oral Oral Oral Oral  SpO2: 97% 99% 98% 98%  Weight:      Height:        General: Pt is alert, awake, not in acute distress Cardiovascular: RRR, S1/S2 +, no rubs, no gallops Respiratory: CTA bilaterally, no wheezing, no rhonchi Abdominal: Soft, NT, ND, bowel sounds + Extremities: no edema, no cyanosis Neurologic: CN 2-12 grossly intact. Sensation intact. Strength grossly intact in all extremities 5/5   The results of significant diagnostics from this hospitalization (including imaging, microbiology, ancillary and laboratory) are listed below for reference.     Microbiology: Recent Results (from the past 240 hour(s))  Resp Panel by RT-PCR (Flu A&B, Covid) Nasopharyngeal Swab     Status: None   Collection Time: 01/23/21  2:15 PM   Specimen: Nasopharyngeal Swab; Nasopharyngeal(NP) swabs in vial transport medium  Result Value Ref Range Status   SARS Coronavirus 2 by RT PCR NEGATIVE NEGATIVE Final    Comment: (NOTE) SARS-CoV-2 target nucleic acids are NOT DETECTED.  The SARS-CoV-2 RNA is generally detectable in upper respiratory specimens during the acute phase of infection. The lowest concentration of SARS-CoV-2 viral copies this assay can detect is 138 copies/mL. A negative result does not preclude SARS-Cov-2 infection and should not be used as the sole basis for treatment or other patient management decisions. A  negative result may occur with  improper specimen collection/handling, submission of specimen other than nasopharyngeal swab, presence of viral mutation(s) within the areas targeted by this assay, and inadequate number of viral copies(<138 copies/mL). A negative result must be combined with clinical observations, patient history, and epidemiological information. The expected result is Negative.  Fact Sheet for Patients:  EntrepreneurPulse.com.au  Fact Sheet for Healthcare  Providers:  IncredibleEmployment.be  This test is no t yet approved or cleared by the [place] and  has been authorized for detection and/or diagnosis of SARS-CoV-2 by FDA under an Emergency Use Authorization (EUA). This EUA will remain  in effect (meaning this test can be used) for the duration of the COVID-19 declaration under Section 564(b)(1) of the Act, 21 U.S.C.section 360bbb-3(b)(1), unless the authorization is terminated  or revoked sooner.       Influenza A by PCR NEGATIVE NEGATIVE Final   Influenza B by PCR NEGATIVE NEGATIVE Final    Comment: (NOTE) The Xpert Xpress SARS-CoV-2/FLU/RSV plus assay is intended as an aid in the diagnosis of influenza from Nasopharyngeal swab specimens and should not be used as a sole basis for treatment. Nasal washings and aspirates are unacceptable for Xpert Xpress SARS-CoV-2/FLU/RSV testing.  Fact Sheet for Patients: EntrepreneurPulse.com.au  Fact Sheet for Healthcare Providers: IncredibleEmployment.be  This test is not yet approved or cleared by the [place] FDA and has been authorized for detection and/or diagnosis of SARS-CoV-2 by FDA under an Emergency Use Authorization (EUA). This EUA will remain in effect (meaning this test can be used) for the duration of the COVID-19 declaration under Section 564(b)(1) of the Act, 21 U.S.C. section 360bbb-3(b)(1), unless the authorization  is terminated or revoked.  Performed at [facility], [address], [place], [place] [address]      Labs: BNP (last 3 results) No results for input(s): BNP in the last 8760 hours. Basic Metabolic Panel: Recent Labs  Lab 01/23/21 1243 01/23/21 1535  NA 142  --   K 4.6  --   CL 105  --   CO2 30  --   GLUCOSE 96  --   BUN 13  --   CREATININE 0.74 0.67  CALCIUM 9.1  --    Liver Function Tests: Recent Labs  Lab 01/23/21 1243  AST 19  ALT 14  ALKPHOS 95  BILITOT 0.4  PROT 7.8  ALBUMIN 3.9   No results for input(s): LIPASE, AMYLASE in the last 168 hours. No results for input(s): AMMONIA in the last 168 hours. CBC: Recent Labs  Lab 01/23/21 1243 01/23/21 1535  WBC 7.9 8.2  NEUTROABS 4.5  --   HGB 12.1 11.9*  HCT 37.5 37.5  MCV 91.9 92.1  PLT 257 264   Cardiac Enzymes: No results for input(s): CKTOTAL, CKMB, CKMBINDEX, TROPONINI in the last 168 hours. BNP: Invalid input(s): POCBNP CBG: Recent Labs  Lab 01/24/21 0758 01/24/21 1117 01/24/21 1719 01/24/21 2110 01/25/21 0716  GLUCAP 192* 301* 82 154* 189*   D-Dimer No results for input(s): DDIMER in the last 72 hours. Hgb A1c Recent Labs    01/24/21 0418  HGBA1C 8.1*   Lipid Profile Recent Labs    01/24/21 0418  CHOL 122  HDL 46  LDLCALC 45  TRIG 153*  CHOLHDL 2.7   Thyroid function studies No results for input(s): TSH, T4TOTAL, T3FREE, THYROIDAB in the last 72 hours.  Invalid input(s): FREET3 Anemia work up No results for input(s): VITAMINB12, FOLATE, FERRITIN, TIBC, IRON, RETICCTPCT in the last 72 hours. Urinalysis    Component Value Date/Time   COLORURINE LT. YELLOW 07/31/2008 0000   APPEARANCEUR CLEAR 07/31/2008 0000   LABSPEC <=1.005 07/31/2008 0000   PHURINE 5.0 07/31/2008 0000   GLUCOSEU NEGATIVE 07/31/2008 0000   BILIRUBINUR NEGATIVE 07/31/2008 0000   KETONESUR NEGATIVE 07/31/2008 0000   UROBILINOGEN 0.2 07/31/2008 0000   NITRITE NEGATIVE 07/31/2008 0000  LEUKOCYTESUR  NEGATIVE 07/31/2008 0000   Sepsis Labs Invalid input(s): PROCALCITONIN,  WBC,  LACTICIDVEN Microbiology Recent Results (from the past 240 hour(s))  Resp Panel by RT-PCR (Flu A&B, Covid) Nasopharyngeal Swab     Status: None   Collection Time: 01/23/21  2:15 PM   Specimen: Nasopharyngeal Swab; Nasopharyngeal(NP) swabs in vial transport medium  Result Value Ref Range Status   SARS Coronavirus 2 by RT PCR NEGATIVE NEGATIVE Final    Comment: (NOTE) SARS-CoV-2 target nucleic acids are NOT DETECTED.  The SARS-CoV-2 RNA is generally detectable in upper respiratory specimens during the acute phase of infection. The lowest concentration of SARS-CoV-2 viral copies this assay can detect is 138 copies/mL. A negative result does not preclude SARS-Cov-2 infection and should not be used as the sole basis for treatment or other patient management decisions. A negative result may occur with  improper specimen collection/handling, submission of specimen other than nasopharyngeal swab, presence of viral mutation(s) within the areas targeted by this assay, and inadequate number of viral copies(<138 copies/mL). A negative result must be combined with clinical observations, patient history, and epidemiological information. The expected result is Negative.  Fact Sheet for Patients:  EntrepreneurPulse.com.au  Fact Sheet for Healthcare Providers:  IncredibleEmployment.be  This test is no t yet approved or cleared by the [place] FDA and  has been authorized for detection and/or diagnosis of SARS-CoV-2 by FDA under an Emergency Use Authorization (EUA). This EUA will remain  in effect (meaning this test can be used) for the duration of the COVID-19 declaration under Section 564(b)(1) of the Act, 21 U.S.C.section 360bbb-3(b)(1), unless the authorization is terminated  or revoked sooner.       Influenza A by PCR NEGATIVE NEGATIVE Final   Influenza B by PCR  NEGATIVE NEGATIVE Final    Comment: (NOTE) The Xpert Xpress SARS-CoV-2/FLU/RSV plus assay is intended as an aid in the diagnosis of influenza from Nasopharyngeal swab specimens and should not be used as a sole basis for treatment. Nasal washings and aspirates are unacceptable for Xpert Xpress SARS-CoV-2/FLU/RSV testing.  Fact Sheet for Patients: EntrepreneurPulse.com.au  Fact Sheet for Healthcare Providers: IncredibleEmployment.be  This test is not yet approved or cleared by the [place] FDA and has been authorized for detection and/or diagnosis of SARS-CoV-2 by FDA under an Emergency Use Authorization (EUA). This EUA will remain in effect (meaning this test can be used) for the duration of the COVID-19 declaration under Section 564(b)(1) of the Act, 21 U.S.C. section 360bbb-3(b)(1), unless the authorization is terminated or revoked.  Performed at [facility], [address], [place], [place] [address]     Time coordinating discharge:   SIGNED:  Gerrit Halls, MS4  [facility] 01/25/2021, 9:50 AM

## 2021-01-24 NOTE — Progress Notes (Signed)
PROGRESS NOTE   Cheryl Middleton  GYK:[id number] DOB: 30-Apr-1965 DOA: 01/23/2021 PCP: Kathyrn Drown, MD   Chief Complaint  Patient presents with   Aphasia   Level of care: Telemetry  Brief Admission History:  Cheryl Middleton is a 55 y.o. female with medical history significant for type 2 diabetes, prior tongue cancer with excision, and dyslipidemia who presented to the ED with complaints of some right-sided upper and lower extremity paresthesias that began on 11/5 at approximately 11 AM.  At presentation, patient endorsed instability and slurred speech.  Head CT showed no acute intracranial findings.  Subsequent MRI head showed Two foci of restricted diffusion within the pons, one on each side, most consistent with acute/subacute infarcts.  Patient has been seen by neurology through telemedicine.  Patient does endorse improvement in weakness and speech.  No other neurological deficits reported.   Assessment & Plan:   Active Problems:   CVA (cerebral vascular accident) ([place])   CVA - bilateral pons infarct - Two foci of restricted diffusion within the pons, one on each side, most consistent with acute/subacute infarcts. -Patient has been seen by teleneurology.  -Neurology recommended addition of aspirin to patient's regimen.  Neurology also recommended MRI head and neck. -Echocardiogram ordered -PT OT have seen patient; no recommendations -Lipid panel showed elevated triglycerides of 153  History of type 2 diabetes -SSI   Dyslipidemia -Continue rosuvastatin -Lipid panel as noted above   DVT prophylaxis: Lovenox Code Status: Full code Family Communication: Sister at bedside Disposition:  Status is: Inpatient  Remains inpatient appropriate because: Further work-up   Consultants:  Teleneurology  Procedures:  N/A  Antimicrobials:  N/A  Subjective: Patient alert and oriented x3 this morning.  She states that she feels well this morning.  Patient endorses  improvement in her in her speech.  She also endorses improvement in her right side strength, although she states that it is not completely at her baseline.  Patient does not report any other neurological deficits.  Objective: Vitals:   01/24/21 0203 01/24/21 0404 01/24/21 0600 01/24/21 0800  BP: 127/69 115/63 121/65 131/76  Pulse: 91 90 89 91  Resp: 18 16 20 20   Temp: 97.8 F (36.6 C) 98.1 F (36.7 C) 98.2 F (36.8 C)   TempSrc: Oral  Oral   SpO2: 97% 98% 99% 98%  Weight:      Height:        Intake/Output Summary (Last 24 hours) at 01/24/2021 1258 Last data filed at 01/24/2021 0300 Gross per 24 hour  Intake 1233.96 ml  Output --  Net 1233.96 ml   Filed Weights   01/23/21 1145  Weight: 75.8 kg    Examination: General: Pt is alert, awake, not in acute distress Cardiovascular: RRR, S1/S2 +, no rubs, no gallops Respiratory: CTA bilaterally, no wheezing, no rhonchi Abdominal: Soft, NT, ND, bowel sounds + Extremities: no edema, no cyanosis Neurologic: CN 2-12 grossly intact. Sensation intact. Strength grossly intact in all extremities 5/5, although patient is slightly weaker on right upper extremities.  Data Reviewed: I have personally reviewed following labs and imaging studies  CBC: Recent Labs  Lab 01/23/21 1243 01/23/21 1535  WBC 7.9 8.2  NEUTROABS 4.5  --   HGB 12.1 11.9*  HCT 37.5 37.5  MCV 91.9 92.1  PLT 257 793    Basic Metabolic Panel: Recent Labs  Lab 01/23/21 1243 01/23/21 1535  NA 142  --   K 4.6  --   CL 105  --  CO2 30  --   GLUCOSE 96  --   BUN 13  --   CREATININE 0.74 0.67  CALCIUM 9.1  --     GFR: Estimated Creatinine Clearance: 76.7 mL/min (by C-G formula based on SCr of 0.67 mg/dL).  Liver Function Tests: Recent Labs  Lab 01/23/21 1243  AST 19  ALT 14  ALKPHOS 95  BILITOT 0.4  PROT 7.8  ALBUMIN 3.9    CBG: Recent Labs  Lab 01/23/21 1150 01/23/21 1632 01/23/21 2001 01/24/21 0758 01/24/21 1117  GLUCAP 87 88 205* 192*  301*    Recent Results (from the past 240 hour(s))  Resp Panel by RT-PCR (Flu A&B, Covid) Nasopharyngeal Swab     Status: None   Collection Time: 01/23/21  2:15 PM   Specimen: Nasopharyngeal Swab; Nasopharyngeal(NP) swabs in vial transport medium  Result Value Ref Range Status   SARS Coronavirus 2 by RT PCR NEGATIVE NEGATIVE Final    Comment: (NOTE) SARS-CoV-2 target nucleic acids are NOT DETECTED.  The SARS-CoV-2 RNA is generally detectable in upper respiratory specimens during the acute phase of infection. The lowest concentration of SARS-CoV-2 viral copies this assay can detect is 138 copies/mL. A negative result does not preclude SARS-Cov-2 infection and should not be used as the sole basis for treatment or other patient management decisions. A negative result may occur with  improper specimen collection/handling, submission of specimen other than nasopharyngeal swab, presence of viral mutation(s) within the areas targeted by this assay, and inadequate number of viral copies(<138 copies/mL). A negative result must be combined with clinical observations, patient history, and epidemiological information. The expected result is Negative.  Fact Sheet for Patients:  EntrepreneurPulse.com.au  Fact Sheet for Healthcare Providers:  IncredibleEmployment.be  This test is no t yet approved or cleared by the [place] FDA and  has been authorized for detection and/or diagnosis of SARS-CoV-2 by FDA under an Emergency Use Authorization (EUA). This EUA will remain  in effect (meaning this test can be used) for the duration of the COVID-19 declaration under Section 564(b)(1) of the Act, 21 U.S.C.section 360bbb-3(b)(1), unless the authorization is terminated  or revoked sooner.       Influenza A by PCR NEGATIVE NEGATIVE Final   Influenza B by PCR NEGATIVE NEGATIVE Final    Comment: (NOTE) The Xpert Xpress SARS-CoV-2/FLU/RSV plus assay is intended  as an aid in the diagnosis of influenza from Nasopharyngeal swab specimens and should not be used as a sole basis for treatment. Nasal washings and aspirates are unacceptable for Xpert Xpress SARS-CoV-2/FLU/RSV testing.  Fact Sheet for Patients: EntrepreneurPulse.com.au  Fact Sheet for Healthcare Providers: IncredibleEmployment.be  This test is not yet approved or cleared by the [place] FDA and has been authorized for detection and/or diagnosis of SARS-CoV-2 by FDA under an Emergency Use Authorization (EUA). This EUA will remain in effect (meaning this test can be used) for the duration of the COVID-19 declaration under Section 564(b)(1) of the Act, 21 U.S.C. section 360bbb-3(b)(1), unless the authorization is terminated or revoked.  Performed at [facility], [address] [place], [place] [address]      Radiology Studies: CT Head Wo Contrast  Result Date: 01/23/2021 CLINICAL DATA:  Slurred speech and right facial weakness beginning yesterday at 11 a.m. EXAM: CT HEAD WITHOUT CONTRAST TECHNIQUE: Contiguous axial images were obtained from the base of the skull through the vertex without intravenous contrast. COMPARISON:  None. FINDINGS: Brain: The brainstem, cerebellum, cerebral peduncles, thalami, basal ganglia, basilar cisterns, and  ventricular system appear within normal limits. No intracranial hemorrhage, mass lesion, or acute CVA. Vascular: Advanced for age atherosclerotic calcification of the carotid siphons. Skull: Unremarkable Sinuses/Orbits: Minimal chronic left maxillary sinusitis. Other: No supplemental non-categorized findings. IMPRESSION: 1. No acute intracranial findings. 2. Advanced for age atherosclerotic calcification of the carotid siphons. 3. Minimal chronic left maxillary sinusitis. Electronically Signed   By: Van Clines M.D.   On: 01/23/2021 13:26   MR BRAIN WO CONTRAST  Result Date: 01/24/2021 CLINICAL DATA:  Neuro  deficit, acute, stroke suspected. EXAM: MRI HEAD WITHOUT CONTRAST TECHNIQUE: Multiplanar, multiecho pulse sequences of the brain and surrounding structures were obtained without intravenous contrast. COMPARISON:  Head CT January 23, 2021. FINDINGS: Brain: Two foci of restricted diffusion are seen within the pons, one on each side, most consistent with acute/subacute infarcts. A few foci of T2 hyperintensity are seen within the white matter of the bilateral frontal lobes nonspecific most likely related to early chronic microangiopathy. No hemorrhage, hydrocephalus, extra-axial collection mass lesion. Vascular: Normal flow voids. Skull and upper cervical spine: Normal marrow signal. Sinuses/Orbits: Bilateral lens surgery. Paranasal sinuses are clear. Other: Postsurgical changes from right-sided partial glossectomy and resection of the right submandibular gland. IMPRESSION: Two foci of restricted diffusion within the pons, one on each side, most consistent with acute/subacute infarcts. No mass effect or hemorrhagic transformation. Electronically Signed   By: Pedro Earls M.D.   On: 01/24/2021 09:25    Scheduled Meds:  aspirin  81 mg Oral Daily   enoxaparin (LOVENOX) injection  40 mg Subcutaneous Q24H   insulin aspart  0-15 Units Subcutaneous TID WC   insulin aspart  0-5 Units Subcutaneous QHS   insulin glargine-yfgn  30 Units Subcutaneous Daily   liraglutide  1.8 mg Subcutaneous BH-q7a   rosuvastatin  20 mg Oral q1800   Continuous Infusions:   LOS: 1 day   Time spent: [address]  Gerrit Halls, MS4  01/24/2021, 12:58 PM

## 2021-01-25 ENCOUNTER — Telehealth: Payer: Self-pay | Admitting: *Deleted

## 2021-01-25 DIAGNOSIS — I639 Cerebral infarction, unspecified: Secondary | ICD-10-CM

## 2021-01-25 LAB — GLUCOSE, CAPILLARY: Glucose-Capillary: 189 mg/dL — ABNORMAL HIGH (ref 70–99)

## 2021-01-25 MED ORDER — CLOPIDOGREL BISULFATE 75 MG PO TABS: 75.0000 mg | ORAL_TABLET | Freq: Every day | ORAL | Status: DC

## 2021-01-25 MED ORDER — ASPIRIN 81 MG PO CHEW: 81.0000 mg | CHEWABLE_TABLET | Freq: Every day | ORAL | 3 refills | Status: AC

## 2021-01-25 MED ORDER — CLOPIDOGREL BISULFATE 75 MG PO TABS: 75.0000 mg | ORAL_TABLET | Freq: Every day | ORAL | 0 refills | Status: DC

## 2021-01-25 NOTE — Progress Notes (Signed)
Pt having tele neuro visit Dr. Hortense Ramal.

## 2021-01-25 NOTE — Telephone Encounter (Signed)
Received a msg from Dr. Manuella Ghazi requesting a 82 Day monitor for CVA. Pt enrolled in [employer]. Monitor to be mailed to pt.

## 2021-01-25 NOTE — Progress Notes (Signed)
Subjective: No acute events overnight.  No new concerns.  ROS: negative except above  Examination  Vital signs in last 24 hours: Temp:  [98.1 F (36.7 C)-98.5 F (36.9 C)] 98.2 F (36.8 C) (11/08 0518) Pulse Rate:  [89-98] 94 (11/08 0518) Resp:  [16-20] 20 (11/08 0518) BP: (115-134)/(62-78) 115/71 (11/08 0518) SpO2:  [97 %-99 %] 98 % (11/08 0518)  General: lying in bed, NAD Neuro: Aox3, PERLA, EOMI, no facial droop, has area of numbness in right cheek due to tongue cancer resection, no new numbness, no visual neglect, antigravity strength without any drift in all extremities, denies any sensory change  Basic Metabolic Panel: Recent Labs  Lab 01/23/21 1243 01/23/21 1535  NA 142  --   K 4.6  --   CL 105  --   CO2 30  --   GLUCOSE 96  --   BUN 13  --   CREATININE 0.74 0.67  CALCIUM 9.1  --     CBC: Recent Labs  Lab 01/23/21 1243 01/23/21 1535  WBC 7.9 8.2  NEUTROABS 4.5  --   HGB 12.1 11.9*  HCT 37.5 37.5  MCV 91.9 92.1  PLT 257 264     Coagulation Studies: No results for input(s): LABPROT, INR in the last 72 hours.  Imaging  Personally reviewed MRA Head and neck wo contrast 01/24/2021:  No intracranial large vessel occlusion. There is moderate narrowing of the bilateral cavernous and supraclinoid ICAs, as well as mild narrowing in the proximal left A1. 2. No hemodynamically significant stenosis in the neck.  ASSESSMENT AND PLAN: 55yo F with sudden right sided weakness and paresthesias.    Acute pontine stroke - No tpa as outside window, no thrombectomy as no LVO - Etiology: suspected embolic - Triglycerides 433, normal LDL, A1c 8.1 - Stroke risk factors: HLD, DM -MRI brain without contrast 01/24/2021: Two foci of restricted diffusion within the pons, one on each side,most consistent with acute/subacute infarcts. No mass effect or hemorrhagic transformation. -MRA head and neck: No hemodynamically significant stenosis in neck, no intracranial Loges  occlusion.  Moderate narrowing of bilateral avernous and supraclinoid ICAs, as well as mild narrowing in the proximal left A1.   Recommendations - start ASA 81mg  daily and Plavix 75 mg for 21 days due to intracranial stenosis - Continue rosuvastatin 20mg  daily for sec stroke prevention - 30 day holter monitor at discharge for suspected paroxysmal atrial fibrillation - Low suspicion for hypercoag state and metastatic disease ( h/o tongue cancer s/p resection) as patient reports local disease more than 20 years ago but can be considered as outpatient if holter negative - Discussed MRA and ECHO findings with patient, current management and plan for, medications - f/u with neuro in 4-6 weeks   Thank you for allowing [place] to participate in the care of this patient. If you have any further questions, please contact  me or neurohospitalist.     I have spent a total of  35 minutes with the patient reviewing hospital notes,  test results, labs and examining the patient as well as establishing an assessment and plan that was discussed personally with the patient and sister at bedside.  > 50% of time was spent in direct patient care.     [facility]      [facility] For questions after 5pm please refer to AMION to reach the Neurologist on call

## 2021-01-25 NOTE — Progress Notes (Signed)
Pt discharged via [place] to POV with personal belongings in her possession, including Victoza pen.

## 2021-01-26 ENCOUNTER — Telehealth: Payer: Self-pay

## 2021-01-26 NOTE — Telephone Encounter (Signed)
Transition Care Management Follow-up Telephone Call Date of discharge and from where: 01/25/2021 [facility] Diagnosis: CVA How have you been since you were released from the hospital? Pt states she is doing well and has returned back to work.  Any questions or concerns? No  Items Reviewed: Did the pt receive and understand the discharge instructions provided? Yes  Medications obtained and verified? Yes  Other? No  Any new allergies since your discharge? No  Dietary orders reviewed? Yes Do you have support at home? Yes   Home Care and Equipment/Supplies: Were home health services ordered? no If so, what is the name of the agency?   Has the agency set up a time to come to the patient's home? not applicable Were any new equipment or medical supplies ordered?  No What is the name of the medical supply agency?  Were you able to get the supplies/equipment? not applicable Do you have any questions related to the use of the equipment or supplies? No  Functional Questionnaire: (I = Independent and D = Dependent) ADLs: I  Bathing/Dressing- I  Meal Prep- I  Eating- I  Maintaining continence- I  Transferring/Ambulation- I  Managing Meds- I  Follow up appointments reviewed:  PCP Hospital f/u appt confirmed? Yes  Scheduled to see Dr. Wolfgang Phoenix on 02/02/21 @ 11:20. [facility] f/u appt confirmed?  Pt has not scheduled yet. Is to see Neurology in 4 weeks.    Are transportation arrangements needed? No  If their condition worsens, is the pt aware to call PCP or go to the Emergency Dept.? Yes Was the patient provided with contact information for the PCP's office or ED? Yes Was to pt encouraged to call back with questions or concerns? Yes

## 2021-02-01 ENCOUNTER — Ambulatory Visit (INDEPENDENT_AMBULATORY_CARE_PROVIDER_SITE_OTHER): Payer: BC Managed Care – PPO

## 2021-02-01 DIAGNOSIS — I639 Cerebral infarction, unspecified: Secondary | ICD-10-CM | POA: Diagnosis not present

## 2021-02-01 DIAGNOSIS — G464 Cerebellar stroke syndrome: Secondary | ICD-10-CM | POA: Diagnosis not present

## 2021-02-02 ENCOUNTER — Ambulatory Visit: Payer: BC Managed Care – PPO | Admitting: Family Medicine

## 2021-02-02 ENCOUNTER — Other Ambulatory Visit: Payer: Self-pay

## 2021-02-02 VITALS — BP 128/78 | HR 99 | Temp 97.0°F | Ht 62.0 in | Wt 165.0 lb

## 2021-02-02 DIAGNOSIS — I10 Essential (primary) hypertension: Secondary | ICD-10-CM

## 2021-02-02 DIAGNOSIS — Z8673 Personal history of transient ischemic attack (TIA), and cerebral infarction without residual deficits: Secondary | ICD-10-CM

## 2021-02-02 DIAGNOSIS — E785 Hyperlipidemia, unspecified: Secondary | ICD-10-CM

## 2021-02-02 DIAGNOSIS — Z794 Long term (current) use of insulin: Secondary | ICD-10-CM

## 2021-02-02 DIAGNOSIS — E1165 Type 2 diabetes mellitus with hyperglycemia: Secondary | ICD-10-CM | POA: Diagnosis not present
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
## 2021-02-03 ENCOUNTER — Encounter: Payer: Self-pay | Admitting: Family Medicine

## 2021-02-04 ENCOUNTER — Encounter: Payer: Self-pay | Admitting: Family Medicine

## 2021-02-04 DIAGNOSIS — Z8673 Personal history of transient ischemic attack (TIA), and cerebral infarction without residual deficits: Secondary | ICD-10-CM

## 2021-02-04 NOTE — Addendum Note (Signed)
Addended by: Vicente Males on: 02/04/2021 10:53 AM   Modules accepted: Orders

## 2021-02-04 NOTE — Telephone Encounter (Signed)
Nurses please go ahead with referral as requested thank you

## 2021-02-12 ENCOUNTER — Other Ambulatory Visit: Payer: Self-pay | Admitting: Family Medicine

## 2021-02-14 ENCOUNTER — Other Ambulatory Visit: Payer: Self-pay

## 2021-02-14 NOTE — Telephone Encounter (Signed)
This medication was only for 21 days after that it is to stop.  Please make sure pharmacy is aware to take this medication off her list, also take it off of epic list thank you

## 2021-02-23 ENCOUNTER — Other Ambulatory Visit: Payer: Self-pay | Admitting: Endocrinology

## 2021-02-26 ENCOUNTER — Encounter: Payer: Self-pay | Admitting: Endocrinology

## 2021-02-26 ENCOUNTER — Other Ambulatory Visit: Payer: Self-pay | Admitting: Family Medicine

## 2021-02-28 ENCOUNTER — Telehealth: Payer: Self-pay | Admitting: Pharmacy Technician

## 2021-02-28 ENCOUNTER — Other Ambulatory Visit: Payer: Self-pay

## 2021-02-28 ENCOUNTER — Other Ambulatory Visit (HOSPITAL_COMMUNITY): Payer: Self-pay

## 2021-02-28 ENCOUNTER — Other Ambulatory Visit: Payer: Self-pay | Admitting: Endocrinology

## 2021-02-28 ENCOUNTER — Encounter: Payer: Self-pay | Admitting: Family Medicine

## 2021-02-28 DIAGNOSIS — E119 Type 2 diabetes mellitus without complications: Secondary | ICD-10-CM

## 2021-02-28 MED ORDER — DEXCOM G6 TRANSMITTER MISC: 3 refills | Status: DC

## 2021-02-28 MED ORDER — INSULIN GLARGINE-YFGN 100 UNIT/ML ~~LOC~~ SOPN: PEN_INJECTOR | SUBCUTANEOUS | 3 refills | Status: DC

## 2021-02-28 NOTE — Telephone Encounter (Signed)
Patient Advocate Encounter  Received notification from [place] that prior authorization for DEXCOM G6 TRANSMITTER is required.   PA NOT NEEDED  Status is pending    Luciano Cutter, CPhT Patient Radio broadcast assistant Endocrinology Phone: [phone] Fax:  [phone]

## 2021-03-01 ENCOUNTER — Other Ambulatory Visit: Payer: Self-pay | Admitting: Endocrinology

## 2021-03-01 NOTE — Telephone Encounter (Signed)
Nurses(this type of paperwork does take some detailed questioning and has significant medical legal aspects to it.  Recommend a office visit patient to bring form with her when she comes we can fill it out at that time)

## 2021-03-14 ENCOUNTER — Encounter: Payer: Self-pay | Admitting: Endocrinology

## 2021-03-15 ENCOUNTER — Other Ambulatory Visit (HOSPITAL_COMMUNITY): Payer: Self-pay

## 2021-03-15 ENCOUNTER — Telehealth: Payer: Self-pay | Admitting: Pharmacy Technician

## 2021-03-15 NOTE — Telephone Encounter (Signed)
SEE NOTE

## 2021-03-15 NOTE — Telephone Encounter (Signed)
Patient Advocate Encounter  Received notification from [facility]) that prior authorization for [place] is required.   PA NOT NEEDED. PHARMACY SUBMITTED INCORRECT INSURANCE. RE-SUBMITTED THROUGH BC/BS RETURN CO-PAY OF [phone]  Key [id number]     Luciano Cutter, CPhT Patient [place] [facility] Phone: [phone] Fax:  [phone]

## 2021-03-17 ENCOUNTER — Other Ambulatory Visit (HOSPITAL_COMMUNITY): Payer: Self-pay

## 2021-03-17 NOTE — Telephone Encounter (Signed)
Pt has picked up the [employer]. Co-pay was $88.

## 2021-03-18 ENCOUNTER — Encounter: Payer: Self-pay | Admitting: Endocrinology

## 2021-03-21 ENCOUNTER — Other Ambulatory Visit: Payer: Self-pay | Admitting: Endocrinology

## 2021-03-21 MED ORDER — BASAGLAR KWIKPEN 100 UNIT/ML ~~LOC~~ SOPN: 60.0000 [IU] | PEN_INJECTOR | SUBCUTANEOUS | 3 refills | Status: DC

## 2021-03-25 ENCOUNTER — Ambulatory Visit: Payer: BC Managed Care – PPO | Admitting: Family Medicine

## 2021-03-29 ENCOUNTER — Other Ambulatory Visit: Payer: Self-pay

## 2021-03-29 ENCOUNTER — Ambulatory Visit (INDEPENDENT_AMBULATORY_CARE_PROVIDER_SITE_OTHER): Payer: Self-pay | Admitting: Family Medicine

## 2021-03-29 ENCOUNTER — Ambulatory Visit: Payer: BC Managed Care – PPO | Admitting: Family Medicine

## 2021-03-29 ENCOUNTER — Encounter: Payer: Self-pay | Admitting: Family Medicine

## 2021-03-29 VITALS — BP 131/83 | HR 97 | Temp 97.4°F | Wt 168.8 lb

## 2021-03-29 DIAGNOSIS — E785 Hyperlipidemia, unspecified: Secondary | ICD-10-CM

## 2021-03-29 DIAGNOSIS — E1169 Type 2 diabetes mellitus with other specified complication: Secondary | ICD-10-CM

## 2021-03-29 DIAGNOSIS — Z8673 Personal history of transient ischemic attack (TIA), and cerebral infarction without residual deficits: Secondary | ICD-10-CM

## 2021-03-29 MED ORDER — GLUCAGON EMERGENCY 1 MG IJ KIT: PACK | INTRAMUSCULAR | 1 refills | Status: DC

## 2021-03-29 NOTE — Progress Notes (Signed)
° °  Subjective:    Patient ID: Cheryl Middleton, female    DOB: [birth year], 56 y.o.   MRN: [id number]  HPI Pt here to have form completed to be able to keep license. Pt states she needs this done soon if not she will have to turn her license in on the 55YH.  Patient did have a low sugar spell back in November She delayed eating lunch she states that she had to wait in line with the fountains to get food and by the time she got food she had a passing out spell  She now has a Dexcom 6 which continuously monitors her sugar She understands the importance of regular eating She has not had any more low sugar spells  She did have a stroke she was in the hospital for 48 hours she has recovered Review of Systems     Objective:   Physical Exam  Finger-to-nose normal Romberg normal visual fields normal eardrums normal strength in arms and legs normal lungs clear heart regular No balance problems cognitive doing well     Assessment & Plan:  History of stroke-no residual findings continue current measures best approach keep cholesterol blood pressure diabetes under very strict control continue medication including the 81 mg aspirin follow-up based upon how she is doing otherwise stay with her specialist on a regular basis for the diabetes control  I find no evidence of any residual problems from her stroke I do not feel there should be any restrictions on her driving  The hypoglycemia spell that she had back in November she is aware what to watch for I also prescribed a glucagon in case she had a severe hypoglycemic spell plus also we did talk about monitoring her sugars closely with her Dexcom if she gets a low sugar alert she should eat a snack she should keep a snack with her  Drive the forms from [employer] filled out

## 2021-03-31 ENCOUNTER — Other Ambulatory Visit: Payer: Self-pay | Admitting: Family Medicine

## 2021-04-15 ENCOUNTER — Other Ambulatory Visit (HOSPITAL_COMMUNITY): Payer: Self-pay

## 2021-04-15 ENCOUNTER — Telehealth: Payer: Self-pay

## 2021-04-15 NOTE — Telephone Encounter (Signed)
Patient Advocate Encounter   Received notification from [facility] that prior authorization for Dexcom G6 Sensors is required by his/her insurance [employer].   PA submitted on 04/15/21  Key#: [id number]  Status is pending    [facility] will continue to follow:  Patient Advocate Fax: [phone]

## 2021-04-15 NOTE — Telephone Encounter (Signed)
Patient Advocate Encounter   Received notification from [facility] that prior authorization for Dexcom G6 Transmitter is required by his/her insurance [employer].   PA submitted on 04/15/21  Key#: [id number]  Status is pending    [facility] will continue to follow:  Patient Advocate Fax: [phone]

## 2021-04-18 ENCOUNTER — Other Ambulatory Visit (HOSPITAL_COMMUNITY): Payer: Self-pay

## 2021-04-18 NOTE — Telephone Encounter (Signed)
Patient Advocate Encounter  Prior Authorization for Dexcom has been approved.    PA# PA Case ID: [id number] Effective dates: 04/15/21 through 04/15/22  Per Test Claim Patients co-pay for transmitter is $90. Unable to get cost for transmitter. Pt already filled 04/15/21

## 2021-04-18 NOTE — Telephone Encounter (Signed)
Patient Advocate Encounter  Prior Authorization for [employer] has been approved.    PA# [id number]  Effective dates: 04/15/21 through 04/15/22  Per Test Claim Patients co-pay is $90   Spoke with Pharmacy to Process.  Patient Advocate Fax: [phone]    Prior Authorization for [place] has been approved.    Effective dates: 04/15/21 through 04/15/22

## 2021-05-05 ENCOUNTER — Ambulatory Visit: Payer: BC Managed Care – PPO | Admitting: Family Medicine

## 2021-06-14 ENCOUNTER — Encounter: Payer: Self-pay | Admitting: Endocrinology

## 2021-06-14 ENCOUNTER — Ambulatory Visit: Payer: BC Managed Care – PPO | Admitting: Endocrinology

## 2021-06-14 ENCOUNTER — Other Ambulatory Visit: Payer: Self-pay

## 2021-06-14 VITALS — BP 146/88 | HR 98 | Ht 62.0 in | Wt 170.4 lb

## 2021-06-14 DIAGNOSIS — E1165 Type 2 diabetes mellitus with hyperglycemia: Secondary | ICD-10-CM | POA: Diagnosis not present

## 2021-06-14 DIAGNOSIS — E119 Type 2 diabetes mellitus without complications: Secondary | ICD-10-CM

## 2021-06-14 DIAGNOSIS — Z794 Long term (current) use of insulin: Secondary | ICD-10-CM | POA: Diagnosis not present

## 2021-06-14 LAB — POCT GLYCOSYLATED HEMOGLOBIN (HGB A1C): Hemoglobin A1C: 8.2 % — AB (ref 4.0–5.6)

## 2021-06-14 LAB — GLUCOSE, POCT (MANUAL RESULT ENTRY): POC Glucose: 78 mg/dl (ref 70–99)

## 2021-06-14 MED ORDER — DAPAGLIFLOZIN PROPANEDIOL 10 MG PO TABS: 10.0000 mg | ORAL_TABLET | Freq: Every day | ORAL | 3 refills | Status: DC

## 2021-06-14 NOTE — Patient Instructions (Addendum)
I have sent a prescription to your pharmacy, to add [place].   ?Please continue the same other diabetes medications.  ?Please come back for a follow-up appointment in 3 months.   ? ?

## 2021-06-14 NOTE — Progress Notes (Signed)
? ?Subjective:  ? ? Patient ID: Cheryl Middleton, female    DOB: 02-Mar-1966, 56 y.o.   MRN: [id number] ? ?HPI ?Pt returns for f/u of diabetes mellitus: ?DM type: Insulin-requiring type 2 ?Dx'ed: 3254 ?Complications: DR and CVA ?Therapy: insulin since 2021, Victoza, and metformin.  ?GDM: never ?DKA: never ?Severe hypoglycemia: once (2022)  ?Pancreatitis: never ?Pancreatic imaging: never.   ?SDOH: Semglee and Lantus were declined by insurance.   ?Other: due to noncompliance, she is not a candidate for multiple daily injections; she eats meals at 9AM, 2PM, and 6PM.   ?Interval history: I reviewed continuous glucose monitor data.  Glucose varies from 80-280.  It increases 7PM-10PM, then decreases until 12N again.  She has mild hypoglycemia 2-3 x/week.  This happens at any time of day.  Pt says she never misses meds.  She had severe hypoglycemia while driving 98/26.  This happened 1 week after CVA.   ?Past Medical History:  ?Diagnosis Date  ? Cancer [facility])   ? tongue  ? Diabetes mellitus, type II ([place])   ? Hyperlipidemia   ? Malignant neoplasm of tongue ([place])   ? Tongue  ? ? ?Past Surgical History:  ?Procedure Laterality Date  ? BREAST EXCISIONAL BIOPSY    ? COLONOSCOPY N/A 04/20/2017  ? Procedure: COLONOSCOPY;  Surgeon: Danie Binder, MD;  [facility];  Service: Endoscopy;  Laterality: N/A;  12:00  ? TONGUE SURGERY  10/19/1999  ? tongue cancer  ? TRACHEOSTOMY    ? ? ?Social History  ? ?Socioeconomic History  ? Marital status: Divorced  ?  Spouse name: Not on file  ? Number of children: Not on file  ? Years of education: Not on file  ? Highest education level: Not on file  ?Occupational History  ? Not on file  ?Tobacco Use  ? Smoking status: Never  ? Smokeless tobacco: Never  ?Vaping Use  ? Vaping Use: Never used  ?Substance and Sexual Activity  ? Alcohol use: No  ? Drug use: No  ? Sexual activity: Yes  ?  Birth control/protection: Post-menopausal  ?Other Topics Concern  ? Not on file  ?Social History  Narrative  ? Not on file  ? ?Social Determinants of Health  ? ?Financial Resource Strain: Not on file  ?Food Insecurity: Not on file  ?Transportation Needs: Not on file  ?Physical Activity: Not on file  ?Stress: Not on file  ?Social Connections: Not on file  ?Intimate Partner Violence: Not on file  ? ? ?Current Outpatient Medications on File Prior to Visit  ?Medication Sig Dispense Refill  ? ASPIRIN ADULT LOW DOSE 81 MG EC tablet Take 1 tablet by mouth in the morning.    ? cholecalciferol (VITAMIN D) 1000 units tablet Take 1 tablet by mouth daily. otc once daily    ? Continuous Blood Gluc Sensor (DEXCOM G6 SENSOR) MISC 1 Device by Does not apply route See admin instructions. Change every 10 days 9 each 3  ? Continuous Blood Gluc Transmit (DEXCOM G6 TRANSMITTER) MISC Change every 3 mos 1 each 3  ? Glucagon, rDNA, (GLUCAGON EMERGENCY) 1 MG KIT Inject if severe low glucose 1 kit 1  ? glucose blood (ONETOUCH VERIO) test strip 1 each by Other route 2 (two) times daily. And lancets 2/day 200 each 3  ? Insulin Glargine (BASAGLAR KWIKPEN) 100 UNIT/ML Inject 60 Units into the skin every morning. And pen needles 1/day 60 mL 3  ? liraglutide (VICTOZA) 18 MG/3ML SOPN Inject 1.8 mg  into the skin every morning. 27 mL 3  ? losartan (COZAAR) 25 MG tablet TAKE 1 TABLET BY MOUTH EVERY DAY 90 tablet 1  ? metFORMIN (GLUCOPHAGE) 1000 MG tablet TAKE ONE TABLET BY MOUTH EVERY DAY WITH FOOD, IF TOLERATED INCREASE TO ONE TWICE A DAY (Patient taking differently: Take 1,000 mg by mouth daily with breakfast. Take one tablet by mouth every day with food, if tolerated increase to one twice a day) 180 tablet 1  ? rosuvastatin (CRESTOR) 20 MG tablet TAKE 1 TABLET BY MOUTH EVERY DAY 90 tablet 1  ? ?No current facility-administered medications on file prior to visit.  ? ? ?Allergies  ?Allergen Reactions  ? Ciprofloxacin   ? Sulfonamide Derivatives   ? Ibuprofen Rash  ? Penicillins Rash  ?  Has patient had a PCN reaction causing immediate rash,  facial/tongue/throat swelling, SOB or lightheadedness with hypotension: Yes ?Has patient had a PCN reaction causing severe rash involving mucus membranes or skin necrosis: no ?Has patient had a PCN reaction that required hospitalization: yes ?Has patient had a PCN reaction occurring within the last 10 years: yes ?If all of the above answers are "NO", then may proceed with Cephalosporin use. ?  ? ? ?Family History  ?Problem Relation Age of Onset  ? Hypertension Mother   ? Diabetes Mother   ? Kidney disease Mother   ? Hypertension Father   ? Diabetes Father   ? Congestive Heart Failure Father   ? COPD Father   ? Hypertension Sister   ? Diabetes Sister   ? Diabetes Sister   ?     borderline  ? ADD / ADHD Son   ? Eczema Son   ? ? ?BP (!) 146/88 (BP Location: Left Arm, Patient Position: Sitting, Cuff Size: Normal)   Pulse 98   Ht 5' 2" (1.575 m)   Wt 170 lb 6.4 oz (77.3 kg)   LMP 07/22/2016   SpO2 98%   BMI 31.17 kg/m?  ? ? ?Review of Systems ? ?   ?Objective:  ? Physical Exam ?VITAL SIGNS:  See vs page.  ?GENERAL: no distress.  ? ?Lab Results  ?Component Value Date  ? CREATININE 0.67 01/23/2021  ? BUN 13 01/23/2021  ? NA 142 01/23/2021  ? K 4.6 01/23/2021  ? CL 105 01/23/2021  ? CO2 30 01/23/2021  ? ? ?A1c=8.2% ?   ?Assessment & Plan:  ?Insulin-requiring type 2 DM: uncontrolled.  ? ?Patient Instructions  ?I have sent a prescription to your pharmacy, to add [place].   ?Please continue the same other diabetes medications.  ?Please come back for a follow-up appointment in 3 months.   ? ? ? ?

## 2021-06-15 ENCOUNTER — Ambulatory Visit: Payer: BC Managed Care – PPO | Admitting: Endocrinology

## 2021-06-20 ENCOUNTER — Other Ambulatory Visit: Payer: Self-pay | Admitting: Endocrinology

## 2021-06-27 ENCOUNTER — Other Ambulatory Visit: Payer: Self-pay | Admitting: Family Medicine

## 2021-07-15 ENCOUNTER — Encounter: Payer: Self-pay | Admitting: Endocrinology

## 2021-07-15 DIAGNOSIS — E1165 Type 2 diabetes mellitus with hyperglycemia: Secondary | ICD-10-CM

## 2021-07-15 MED ORDER — DEXCOM G6 SENSOR MISC: 1.0000 | 3 refills | Status: DC

## 2021-09-12 ENCOUNTER — Telehealth: Payer: Self-pay | Admitting: Pharmacy Technician

## 2021-10-15 ENCOUNTER — Other Ambulatory Visit: Payer: Self-pay | Admitting: Family Medicine

## 2021-11-18 ENCOUNTER — Encounter: Payer: Self-pay | Admitting: Emergency Medicine

## 2021-11-18 ENCOUNTER — Ambulatory Visit
Admission: EM | Admit: 2021-11-18 | Discharge: 2021-11-18 | Disposition: A | Discharge status: Home / Self Care | Payer: BC Managed Care – PPO | Attending: Family Medicine | Admitting: Family Medicine

## 2021-11-18 ENCOUNTER — Other Ambulatory Visit: Payer: Self-pay

## 2021-11-18 DIAGNOSIS — R21 Rash and other nonspecific skin eruption: Secondary | ICD-10-CM

## 2021-11-18 DIAGNOSIS — M7989 Other specified soft tissue disorders: Secondary | ICD-10-CM | POA: Diagnosis not present

## 2021-11-18 DIAGNOSIS — R238 Other skin changes: Secondary | ICD-10-CM

## 2021-11-18 MED ORDER — CETIRIZINE HCL 10 MG PO TABS: 10.0000 mg | ORAL_TABLET | Freq: Every day | ORAL | 0 refills | Status: DC

## 2021-11-18 MED ORDER — DOXYCYCLINE HYCLATE 100 MG PO CAPS: 100.0000 mg | ORAL_CAPSULE | Freq: Two times a day (BID) | ORAL | 0 refills | Status: DC

## 2021-11-18 MED ORDER — CLOBETASOL PROPIONATE 0.05 % EX OINT
1.0000 | TOPICAL_OINTMENT | Freq: Two times a day (BID) | CUTANEOUS | 0 refills | Status: DC
Start: 2021-11-18 — End: 2022-06-01

## 2021-11-18 NOTE — Discharge Instructions (Addendum)
I have prescribed you a topical steroid and antihistamine tablet to see if this is more of an allergic or inflammatory response causing the swelling and itching.  I will also cover for possible infection to that right arm area with an antibiotic called doxycycline.  Keep the open areas on your upper arm clean, covered with Neosporin and a bandage at all times.  Follow-up if your symptoms worsen at any time.

## 2021-11-18 NOTE — ED Triage Notes (Signed)
Pt reports felt a itching sensation on right arm while at work a few days ago and reports scratched site and states right posterior arm is now red and swollen. Pt also reports generalized red raised areas noted to BLE.denies any recent changes in self care products.

## 2021-11-18 NOTE — ED Provider Notes (Signed)
[facility]    CSN: [id number] Arrival date & time: 11/18/21  1740      History   Chief Complaint Chief Complaint  Patient presents with   Rash    HPI Cheryl Middleton is a 56 y.o. female.   Patient presenting today complaining of an itchy area to the right upper arm that started on Wednesday, states she has been scratching it and now most of her upper arm is red, swollen and significantly itchy and tender.  Some clear drainage from a blistering area toward her axilla.  She is also noticing some sporadic itchy bumps to her bilateral lower extremities as well.  Denies any new products, medications, supplements and has not recently been outside.  Trying Benadryl cream with no relief.  Denies fever, chills, chest tightness, shortness of breath, throat itching or swelling.    Past Medical History:  Diagnosis Date   Cancer (HCC)    tongue   Diabetes mellitus, type II (HCC)    Hyperlipidemia    Malignant neoplasm of tongue (HCC)    Tongue    Patient Active Problem List   Diagnosis Date Noted   History of CVA (cerebrovascular accident) 02/02/2021   CVA (cerebral vascular accident) (HCC) 01/23/2021   Elevated blood pressure reading 06/24/2020   Special screening for malignant neoplasms, colon    Vitamin D deficiency 06/12/2016   Personal history of noncompliance with medical treatment, presenting hazards to health 04/14/2015   Uncontrolled type 2 diabetes mellitus with hyperglycemia, with long-term current use of insulin (HCC) 03/16/2015   Essential hypertension, benign 03/16/2015   Hyperlipidemia 07/31/2008    Past Surgical History:  Procedure Laterality Date   BREAST EXCISIONAL BIOPSY     COLONOSCOPY N/A 04/20/2017   Procedure: COLONOSCOPY;  Surgeon: West Bali, MD;  [facility];  Service: Endoscopy;  Laterality: N/A;  12:00   TONGUE SURGERY  10/19/1999   tongue cancer   TRACHEOSTOMY      OB History     Gravida  1   Para  1   Term   1   Preterm      AB      Living  1      SAB      IAB      Ectopic      Multiple      Live Births  1            Home Medications    Prior to Admission medications   Medication Sig Start Date End Date Taking? Authorizing Provider  cetirizine (ZYRTEC ALLERGY) 10 MG tablet Take 1 tablet (10 mg total) by mouth daily. 11/18/21  Yes Particia Nearing, PA-C  clobetasol ointment (TEMOVATE) 0.05 % Apply 1 Application topically 2 (two) times daily. 11/18/21  Yes Particia Nearing, PA-C  doxycycline (VIBRAMYCIN) 100 MG capsule Take 1 capsule (100 mg total) by mouth 2 (two) times daily. 11/18/21  Yes Particia Nearing, PA-C  ASPIRIN ADULT LOW DOSE 81 MG EC tablet Take 1 tablet by mouth in the morning. 01/25/21   [provider]  cholecalciferol (VITAMIN D) 1000 units tablet Take 1 tablet by mouth daily. otc once daily    [provider]  Continuous Blood Gluc Sensor (DEXCOM G6 SENSOR) MISC 1 Device by Does not apply route See admin instructions. Change every 10 days 07/15/21   Romero Belling, MD  Continuous Blood Gluc Transmit (DEXCOM G6 TRANSMITTER) MISC Change every 3 mos 02/28/21  Renato Shin, MD  dapagliflozin propanediol (FARXIGA) 10 MG TABS tablet Take 1 tablet (10 mg total) by mouth daily before breakfast. 06/14/21   Renato Shin, MD  Glucagon, rDNA, (GLUCAGON EMERGENCY) 1 MG KIT Inject if severe low glucose 03/29/21   Luking, Scott A, MD  glucose blood (ONETOUCH VERIO) test strip 1 each by Other route 2 (two) times daily. And lancets 2/day 06/17/20   Renato Shin, MD  Insulin Glargine [facility]) 100 UNIT/ML Inject 60 Units into the skin every morning. And pen needles 1/day 03/21/21   Renato Shin, MD  losartan (COZAAR) 25 MG tablet TAKE 1 TABLET BY MOUTH EVERY DAY 06/27/21   Kathyrn Drown, MD  metFORMIN (GLUCOPHAGE) 1000 MG tablet TAKE ONE TABLET BY MOUTH EVERY DAY WITH FOOD, IF TOLERATED INCREASE TO ONE TWICE A DAY Patient taking differently:  Take 1,000 mg by mouth daily with breakfast. Take one tablet by mouth every day with food, if tolerated increase to one twice a day 09/21/20   Kathyrn Drown, MD  rosuvastatin (CRESTOR) 20 MG tablet TAKE 1 TABLET BY MOUTH EVERY DAY 10/17/21   Kathyrn Drown, MD  VICTOZA 18 MG/3ML SOPN INJECT 1.8 MG UNDER THE SKIN ONCE DAILY IN THE MORNING 06/26/21   Renato Shin, MD    Family History Family History  Problem Relation Age of Onset   Hypertension Mother    Diabetes Mother    Kidney disease Mother    Hypertension Father    Diabetes Father    Congestive Heart Failure Father    COPD Father    Hypertension Sister    Diabetes Sister    Diabetes Sister        borderline   ADD / ADHD Son    Eczema Son     Social History Social History   Tobacco Use   Smoking status: Never   Smokeless tobacco: Never  Vaping Use   Vaping Use: Never used  Substance Use Topics   Alcohol use: No   Drug use: No     Allergies   Ciprofloxacin, Sulfonamide derivatives, Ibuprofen, and Penicillins   Review of Systems Review of Systems Per HPI  Physical Exam Triage Vital Signs ED Triage Vitals  Enc Vitals Group     BP 11/18/21 1851 118/68     Pulse Rate 11/18/21 1851 (!) 110     Resp 11/18/21 1851 20     Temp 11/18/21 1851 (!) 97.5 F (36.4 C)     Temp Source 11/18/21 1851 Oral     SpO2 11/18/21 1851 95 %     Weight --      Height --      Head Circumference --      Peak Flow --      Pain Score 11/18/21 1852 2     Pain Loc --      Pain Edu? --      Excl. in [place]? --    No data found.  Updated Vital Signs BP 118/68 (BP Location: Right Arm)   Pulse (!) 110   Temp (!) 97.5 F (36.4 C) (Oral)   Resp 20   LMP 07/22/2016   SpO2 95%   Visual Acuity Right Eye Distance:   Left Eye Distance:   Bilateral Distance:    Right Eye Near:   Left Eye Near:    Bilateral Near:     Physical Exam Vitals and nursing note reviewed.  Constitutional:      Appearance: Normal appearance. She is  not  ill-appearing.  HENT:     Head: Atraumatic.  Eyes:     Extraocular Movements: Extraocular movements intact.     Conjunctiva/sclera: Conjunctivae normal.  Cardiovascular:     Rate and Rhythm: Normal rate and regular rhythm.     Heart sounds: Normal heart sounds.  Pulmonary:     Effort: Pulmonary effort is normal.     Breath sounds: Normal breath sounds.  Musculoskeletal:        General: Normal range of motion.     Cervical back: Normal range of motion and neck supple.  Skin:    General: Skin is warm.     Comments: Small cluster of blisters, ulceration inner upper right arm with significant area of edema, erythema widespread down toward elbow.  No fluctuance, induration to this area.  Dried clear drainage present from blistering area.  Sporadic erythematous papular rash to bilateral lower legs  Neurological:     Mental Status: She is alert and oriented to person, place, and time.     Comments: Right upper extremity neurovascularly intact  Psychiatric:        Mood and Affect: Mood normal.        Thought Content: Thought content normal.        Judgment: Judgment normal.      UC Treatments / Results  Labs (all labs ordered are listed, but only abnormal results are displayed) Labs Reviewed - No data to display  EKG   Radiology No results found.  Procedures Procedures (including critical care time)  Medications Ordered in UC Medications - No data to display  Initial Impression / Assessment and Plan / UC Course  I have reviewed the triage vital signs and the nursing notes.  Pertinent labs & imaging results that were available during my care of the patient were reviewed by me and considered in my medical decision making (see chart for details).     Given her history of insulin-dependent diabetes, we will forego oral or injectable steroids and provide clobetasol ointment for topical treatment of likely inflammatory rashes.  We will also cover for possible cellulitis of the  arm with doxycycline.  Discussed good home wound care, antihistamines, return precautions.  Final Clinical Impressions(s) / UC Diagnoses   Final diagnoses:  Rash  Redness and swelling of upper arm     Discharge Instructions      I have prescribed you a topical steroid and antihistamine tablet to see if this is more of an allergic or inflammatory response causing the swelling and itching.  I will also cover for possible infection to that right arm area with an antibiotic called doxycycline.  Keep the open areas on your upper arm clean, covered with Neosporin and a bandage at all times.  Follow-up if your symptoms worsen at any time.     ED Prescriptions     Medication Sig Dispense Auth. Provider   clobetasol ointment (TEMOVATE) 6.43 % Apply 1 Application topically 2 (two) times daily. 60 g Volney American, [place]   doxycycline (VIBRAMYCIN) 100 MG capsule Take 1 capsule (100 mg total) by mouth 2 (two) times daily. 14 capsule Volney American, [place]   cetirizine (ZYRTEC ALLERGY) 10 MG tablet Take 1 tablet (10 mg total) by mouth daily. 30 tablet Volney American, [place]      PDMP not reviewed this encounter.   Volney American, [place] 11/18/21 1931

## 2021-12-06 ENCOUNTER — Encounter: Payer: Self-pay | Admitting: Family Medicine

## 2021-12-07 ENCOUNTER — Other Ambulatory Visit: Payer: Self-pay | Admitting: Nurse Practitioner

## 2021-12-07 MED ORDER — BASAGLAR KWIKPEN 100 UNIT/ML ~~LOC~~ SOPN: 60.0000 [IU] | PEN_INJECTOR | SUBCUTANEOUS | 0 refills | Status: DC

## 2021-12-10 ENCOUNTER — Other Ambulatory Visit: Payer: Self-pay | Admitting: Family Medicine

## 2021-12-22 ENCOUNTER — Ambulatory Visit: Payer: BC Managed Care – PPO | Admitting: Family Medicine

## 2021-12-22 ENCOUNTER — Encounter: Payer: Self-pay | Admitting: Family Medicine

## 2021-12-22 ENCOUNTER — Ambulatory Visit (INDEPENDENT_AMBULATORY_CARE_PROVIDER_SITE_OTHER): Payer: BC Managed Care – PPO | Admitting: Family Medicine

## 2021-12-22 ENCOUNTER — Telehealth: Payer: Self-pay | Admitting: Internal Medicine

## 2021-12-22 VITALS — BP 130/70 | HR 96 | Temp 98.0°F | Wt 169.6 lb

## 2021-12-22 DIAGNOSIS — R21 Rash and other nonspecific skin eruption: Secondary | ICD-10-CM | POA: Diagnosis not present

## 2021-12-22 DIAGNOSIS — Z794 Long term (current) use of insulin: Secondary | ICD-10-CM | POA: Diagnosis not present

## 2021-12-22 DIAGNOSIS — E1165 Type 2 diabetes mellitus with hyperglycemia: Secondary | ICD-10-CM | POA: Diagnosis not present

## 2021-12-22 MED ORDER — CETIRIZINE HCL 10 MG PO TABS: 10.0000 mg | ORAL_TABLET | Freq: Every day | ORAL | 3 refills | Status: DC

## 2021-12-22 MED ORDER — PREDNISONE 10 MG (21) PO TBPK: ORAL_TABLET | ORAL | 0 refills | Status: DC

## 2021-12-22 MED ORDER — BASAGLAR KWIKPEN 100 UNIT/ML ~~LOC~~ SOPN: 60.0000 [IU] | PEN_INJECTOR | SUBCUTANEOUS | 0 refills | Status: DC

## 2021-12-22 MED ORDER — FAMOTIDINE 20 MG PO TABS: 20.0000 mg | ORAL_TABLET | Freq: Two times a day (BID) | ORAL | 3 refills | Status: DC

## 2021-12-22 NOTE — Patient Instructions (Signed)
Medications as directed.  I have placed a referral for endocrinology.  I will go ahead and call their office so that she can be seen.  There are other endocrinologist there.  If rash continues to persist, please let me know and I will refer you to allergy.

## 2021-12-22 NOTE — Telephone Encounter (Signed)
done

## 2021-12-22 NOTE — Assessment & Plan Note (Signed)
Concern for hives.  Placing on prednisone, Zyrtec, and Pepcid.  If continues to persist will refer to allergy.

## 2021-12-22 NOTE — Progress Notes (Signed)
Subjective:  Patient ID: Cheryl Middleton, female    DOB: 1966-02-21  Age: 56 y.o. MRN: [id number]  CC: Chief Complaint  Patient presents with   Allergic Reaction    Pt arrives due to rash on right arm, neck/shoulder left side, left leg. Starts out as an itch then turns to rash/blister. Tender to touch at times.  Pt did go to Urgent Care on 11/18/21 and they gave topical ointment, Zyrtec and antibiotics.     HPI:  56 year old female presents for evaluation of the above.  Patient reports that she had ongoing rash for greater than 1 month.  She states that the rash is raised and itchy.  Predominately on the back of the neck and the left side of the neck.  Also located on the right arm.  She has been seen at urgent care and was given topical corticosteroid ointment as well as Zyrtec and antibiotic therapy.  She states that she has had no significant improvement.  It is very itchy and very problematic for her.  Areas are swollen as well.  No new exposures.  No new medications.  No new foods.   Patient Active Problem List   Diagnosis Date Noted   Rash 12/22/2021   History of CVA (cerebrovascular accident) 02/02/2021   CVA (cerebral vascular accident) ([place]) 01/23/2021   Elevated blood pressure reading 06/24/2020   Special screening for malignant neoplasms, colon    Vitamin D deficiency 06/12/2016   Personal history of noncompliance with medical treatment, presenting hazards to health 04/14/2015   Uncontrolled type 2 diabetes mellitus with hyperglycemia, with long-term current use of insulin ([place]) 03/16/2015   Essential hypertension, benign 03/16/2015   Hyperlipidemia 07/31/2008    Social Hx   Social History   Socioeconomic History   Marital status: Divorced    Spouse name: Not on file   Number of children: Not on file   Years of education: Not on file   Highest education level: Not on file  Occupational History   Not on file  Tobacco Use   Smoking status: Never   Smokeless  tobacco: Never  Vaping Use   Vaping Use: Never used  Substance and Sexual Activity   Alcohol use: No   Drug use: No   Sexual activity: Yes    Birth control/protection: Post-menopausal  Other Topics Concern   Not on file  Social History Narrative   Not on file   Social Determinants of Health   Financial Resource Strain: Not on file  Food Insecurity: Not on file  Transportation Needs: Not on file  Physical Activity: Not on file  Stress: Not on file  Social Connections: Not on file    Review of Systems Per HPI  Objective:  BP 130/70   Pulse 96   Temp 98 F (36.7 C)   Wt 169 lb 9.6 oz (76.9 kg)   LMP 07/22/2016   SpO2 96%   BMI 31.02 kg/m      12/22/2021   11:09 AM 11/18/2021    6:51 PM 06/14/2021    3:24 PM  BP/Weight  Systolic BP 382 505 397  Diastolic BP 70 68 88  Wt. (Lbs) 169.6  170.4  BMI 31.02 kg/m2  31.17 kg/m2    Physical Exam Vitals and nursing note reviewed.  Constitutional:      General: She is not in acute distress.    Appearance: Normal appearance. She is not ill-appearing.  HENT:     Head: Normocephalic and atraumatic.  Eyes:  General:        Right eye: No discharge.        Left eye: No discharge.     Conjunctiva/sclera: Conjunctivae normal.  Cardiovascular:     Rate and Rhythm: Normal rate and regular rhythm.  Pulmonary:     Effort: Pulmonary effort is normal.     Breath sounds: Normal breath sounds. No wheezing, rhonchi or rales.  Skin:    Comments: Patient has scattered raised areas of erythema.  There is a large area on her right upper arm which is very edematous and erythematous.  Neurological:     Mental Status: She is alert.  Psychiatric:        Mood and Affect: Mood normal.        Behavior: Behavior normal.     Lab Results  Component Value Date   WBC 8.2 01/23/2021   HGB 11.9 (L) 01/23/2021   HCT 37.5 01/23/2021   PLT 264 01/23/2021   GLUCOSE 96 01/23/2021   CHOL 122 01/24/2021   TRIG 153 (H) 01/24/2021   HDL 46  01/24/2021   LDLDIRECT 159.9 07/31/2008   LDLCALC 45 01/24/2021   ALT 14 01/23/2021   AST 19 01/23/2021   NA 142 01/23/2021   K 4.6 01/23/2021   CL 105 01/23/2021   CREATININE 0.67 01/23/2021   BUN 13 01/23/2021   CO2 30 01/23/2021   TSH 1.33 06/17/2020   HGBA1C 8.2 (A) 06/14/2021   MICROALBUR 0.4 07/31/2008     Assessment & Plan:   Problem List Items Addressed This Visit       Endocrine   Uncontrolled type 2 diabetes mellitus with hyperglycemia, with long-term current use of insulin ([place])   Relevant Orders   Ambulatory referral to Endocrinology     Musculoskeletal and Integument   Rash - Primary    Concern for hives.  Placing on prednisone, Zyrtec, and Pepcid.  If continues to persist will refer to allergy.       Meds ordered this encounter  Medications   cetirizine (ZYRTEC ALLERGY) 10 MG tablet    Sig: Take 1 tablet (10 mg total) by mouth daily.    Dispense:  30 tablet    Refill:  3   predniSONE (STERAPRED UNI-PAK 21 TAB) 10 MG (21) TBPK tablet    Sig: 6 tablets on day 1; decrease by 1 tablet daily until gone.    Dispense:  21 tablet    Refill:  0   famotidine (PEPCID) 20 MG tablet    Sig: Take 1 tablet (20 mg total) by mouth 2 (two) times daily.    Dispense:  60 tablet    Refill:  3    Follow-up:  If fails to improve or worsens (otherwise follow-up is as previously scheduled)  Thersa Salt DO [place]

## 2021-12-22 NOTE — Telephone Encounter (Signed)
MEDICATION: Agricultural engineer 100 unit  PHARMACY: [facility]   HAS THE PATIENT CONTACTED THEIR PHARMACY?  no  IS THIS A 90 DAY SUPPLY : unknown  IS PATIENT OUT OF MEDICATION: yes  IF NOT; HOW MUCH IS LEFT:   LAST APPOINTMENT DATE: '@3'$ /28/2023  NEXT APPOINTMENT DATE:'@11'$ /03/2021  DO WE HAVE YOUR PERMISSION TO LEAVE A DETAILED MESSAGE?:  OTHER COMMENTS:    **Let patient know to contact pharmacy at the end of the day to make sure medication is ready. **  ** Please notify patient to allow 48-72 hours to process**  **Encourage patient to contact the pharmacy for refills or they can request refills through [facility]**

## 2022-01-03 ENCOUNTER — Other Ambulatory Visit (HOSPITAL_COMMUNITY): Payer: Self-pay | Admitting: Family Medicine

## 2022-01-03 DIAGNOSIS — Z1231 Encounter for screening mammogram for malignant neoplasm of breast: Secondary | ICD-10-CM

## 2022-01-04 ENCOUNTER — Encounter (HOSPITAL_COMMUNITY): Payer: BC Managed Care – PPO

## 2022-01-04 DIAGNOSIS — Z1231 Encounter for screening mammogram for malignant neoplasm of breast: Secondary | ICD-10-CM

## 2022-01-06 ENCOUNTER — Other Ambulatory Visit: Payer: Self-pay

## 2022-01-06 DIAGNOSIS — E1165 Type 2 diabetes mellitus with hyperglycemia: Secondary | ICD-10-CM

## 2022-01-06 MED ORDER — VICTOZA 18 MG/3ML ~~LOC~~ SOPN: PEN_INJECTOR | SUBCUTANEOUS | 1 refills | Status: DC

## 2022-01-16 ENCOUNTER — Ambulatory Visit (HOSPITAL_COMMUNITY)
Admission: RE | Admit: 2022-01-16 | Discharge: 2022-01-16 | Disposition: A | Discharge status: Home / Self Care | Payer: BC Managed Care – PPO | Source: Ambulatory Visit | Attending: Family Medicine | Admitting: Family Medicine

## 2022-01-16 DIAGNOSIS — Z1231 Encounter for screening mammogram for malignant neoplasm of breast: Secondary | ICD-10-CM | POA: Diagnosis not present

## 2022-01-18 ENCOUNTER — Encounter: Payer: Self-pay | Admitting: Internal Medicine

## 2022-01-18 ENCOUNTER — Ambulatory Visit: Payer: BC Managed Care – PPO | Admitting: Internal Medicine

## 2022-01-18 VITALS — BP 126/84 | HR 64 | Ht 62.0 in | Wt 176.0 lb

## 2022-01-18 DIAGNOSIS — E1159 Type 2 diabetes mellitus with other circulatory complications: Secondary | ICD-10-CM

## 2022-01-18 DIAGNOSIS — E119 Type 2 diabetes mellitus without complications: Secondary | ICD-10-CM | POA: Diagnosis not present

## 2022-01-18 DIAGNOSIS — E1165 Type 2 diabetes mellitus with hyperglycemia: Secondary | ICD-10-CM

## 2022-01-18 DIAGNOSIS — Z794 Long term (current) use of insulin: Secondary | ICD-10-CM

## 2022-01-18 LAB — POCT GLYCOSYLATED HEMOGLOBIN (HGB A1C): Hemoglobin A1C: 9.6 % — AB (ref 4.0–5.6)

## 2022-01-18 MED ORDER — DAPAGLIFLOZIN PROPANEDIOL 10 MG PO TABS: 10.0000 mg | ORAL_TABLET | Freq: Every day | ORAL | 3 refills | Status: DC

## 2022-01-18 MED ORDER — METFORMIN HCL ER 750 MG PO TB24: 750.0000 mg | ORAL_TABLET | Freq: Every day | ORAL | 3 refills | Status: DC

## 2022-01-18 MED ORDER — BASAGLAR KWIKPEN 100 UNIT/ML ~~LOC~~ SOPN: 54.0000 [IU] | PEN_INJECTOR | Freq: Every day | SUBCUTANEOUS | 3 refills | Status: DC

## 2022-01-18 MED ORDER — SEMAGLUTIDE (1 MG/DOSE) 4 MG/3ML ~~LOC~~ SOPN: 1.0000 mg | PEN_INJECTOR | SUBCUTANEOUS | 3 refills | Status: DC

## 2022-01-18 NOTE — Patient Instructions (Addendum)
Restart Metformin 750 mg XR, 1 tablet daily  Continue Farxiga 10 mg daily  Switch Victoza to Ozempic 1 mg ONCE weekly  Decrease Basaglar to 54 units once daily      HOW TO TREAT LOW BLOOD SUGARS (Blood sugar LESS THAN 70 MG/DL) Please follow the RULE OF 15 for the treatment of hypoglycemia treatment (when your (blood sugars are less than 70 mg/dL)   STEP 1: Take 15 grams of carbohydrates when your blood sugar is low, which includes:  3-4 GLUCOSE TABS  OR 3-4 OZ OF JUICE OR REGULAR SODA OR ONE TUBE OF GLUCOSE GEL    STEP 2: RECHECK blood sugar in 15 MINUTES STEP 3: If your blood sugar is still low at the 15 minute recheck --> then, go back to STEP 1 and treat AGAIN with another 15 grams of carbohydrates.

## 2022-01-18 NOTE — Progress Notes (Signed)
Name: Cheryl Middleton  Age/ Sex: 56 y.o., female   MRN/ DOB: [id number], 07-Nov-1965     PCP: Kathyrn Drown, MD   Reason for Endocrinology Evaluation: Type 2 Diabetes Mellitus  Initial Endocrine Consultative Visit: 06/17/2020    PATIENT IDENTIFIER: Cheryl Middleton is a 56 y.o. female with a past medical history of DM, HTN , CVA . The patient has followed with Endocrinology clinic since 06/17/2020 for consultative assistance with management of her diabetes.  DIABETIC HISTORY:  Cheryl Middleton was diagnosed with DM 1987. Started insulin therapy  2021. Her hemoglobin A1c has ranged from 8.0% in 2022, peaking at 13.0% in 2019.  Patient has followed by Dr. Loanne Drilling from 2022 until March 2023   SUBJECTIVE:   During the last visit (06/14/2021): Saw Dr. Loanne Drilling     Today (01/18/2022): Ms. [name] is here for a follow up on diabetes management.  She checks her blood sugars multiple  times daily. The patient has  had hypoglycemic episodes since the last clinic visit, which typically occur     Has been out of metformin for 05/2021 denies intolerance issues  She was on prednisone for rash    HOME DIABETES REGIMEN:  Metformin 1000 mg  daily  Farxiga 10 mg daily  Victoza 1.8 daily  Basaglar 60 units   Statin: yes ACE-I/ARB: yes   CONTINUOUS GLUCOSE MONITORING RECORD INTERPRETATION    Dates of Recording: 10/19-11/03/2021  Sensor description: dexcom  Results statistics:   CGM use % of time 50  Average and SD 166/65  Time in range       59 %  % Time Above 180 28  % Time above 250 11  % Time Below target 1   Glycemic patterns summary: Bg's optimal at night, high during the day   Hyperglycemic episodes  postprandial   Hypoglycemic episodes occurred  at night   Overnight periods:  trends down      DIABETIC COMPLICATIONS: Microvascular complications:   Denies: CKD  Last Eye Exam: Completed 03/2021  Macrovascular complications:  CVA Denies: CAD, PVD   HISTORY:   Past Medical History:  Past Medical History:  Diagnosis Date   Cancer ([place])    tongue   Diabetes mellitus, type II (Cheryl Middleton)    Hyperlipidemia    Malignant neoplasm of tongue ([place])    Tongue   Past Surgical History:  Past Surgical History:  Procedure Laterality Date   BREAST EXCISIONAL BIOPSY     COLONOSCOPY N/A 04/20/2017   Procedure: COLONOSCOPY;  Surgeon: Danie Binder, MD;  Location: [facility];  Service: Endoscopy;  Laterality: N/A;  12:00   TONGUE SURGERY  10/19/1999   tongue cancer   TRACHEOSTOMY     Social History:  reports that she has never smoked. She has never used smokeless tobacco. She reports that she does not drink alcohol and does not use drugs. Family History:  Family History  Problem Relation Age of Onset   Hypertension Mother    Diabetes Mother    Kidney disease Mother    Hypertension Father    Diabetes Father    Congestive Heart Failure Father    COPD Father    Hypertension Sister    Diabetes Sister    Diabetes Sister        borderline   ADD / ADHD Son    Eczema Son      HOME MEDICATIONS: Allergies as of 01/18/2022       Reactions   Ciprofloxacin  Sulfonamide Derivatives    Ibuprofen Rash   Penicillins Rash   Has patient had a PCN reaction causing immediate rash, facial/tongue/throat swelling, SOB or lightheadedness with hypotension: Yes Has patient had a PCN reaction causing severe rash involving mucus membranes or skin necrosis: no Has patient had a PCN reaction that required hospitalization: yes Has patient had a PCN reaction occurring within the last 10 years: yes If all of the above answers are "NO", then may proceed with Cephalosporin use.        Medication List        Accurate as of January 18, 2022  4:17 PM. If you have any questions, ask your nurse or doctor.          STOP taking these medications    metFORMIN 1000 MG tablet Commonly known as: GLUCOPHAGE Replaced by: metFORMIN 750 MG 24 hr tablet Stopped by:  Dorita Sciara, MD   predniSONE 10 MG (21) Tbpk tablet Commonly known as: STERAPRED UNI-PAK 21 TAB Stopped by: Dorita Sciara, MD   Victoza 18 MG/3ML Sopn Generic drug: liraglutide Stopped by: Dorita Sciara, MD       TAKE these medications    Aspirin Adult Low Dose 81 MG tablet Generic drug: aspirin EC Take 1 tablet by mouth in the morning.   Basaglar KwikPen 100 UNIT/ML Inject 54 Units into the skin daily. And pen needles 1/day What changed:  how much to take when to take this Changed by: Dorita Sciara, MD   cetirizine 10 MG tablet Commonly known as: ZyrTEC Allergy Take 1 tablet (10 mg total) by mouth daily.   cholecalciferol 1000 units tablet Commonly known as: VITAMIN D Take 1 tablet by mouth daily. otc once daily   clobetasol ointment 0.05 % Commonly known as: TEMOVATE Apply 1 Application topically 2 (two) times daily.   dapagliflozin propanediol 10 MG Tabs tablet Commonly known as: Farxiga Take 1 tablet (10 mg total) by mouth daily before breakfast.   Dexcom G6 Sensor Misc 1 Device by Does not apply route See admin instructions. Change every 10 days   Dexcom G6 Transmitter Misc Change every 3 mos   famotidine 20 MG tablet Commonly known as: Pepcid Take 1 tablet (20 mg total) by mouth 2 (two) times daily.   Glucagon Emergency 1 MG Kit Inject if severe low glucose   losartan 25 MG tablet Commonly known as: COZAAR TAKE 1 TABLET BY MOUTH EVERY DAY   metFORMIN 750 MG 24 hr tablet Commonly known as: GLUCOPHAGE-XR Take 1 tablet (750 mg total) by mouth daily with breakfast. Replaces: metFORMIN 1000 MG tablet Started by: Dorita Sciara, MD   OneTouch Verio test strip Generic drug: glucose blood 1 each by Other route 2 (two) times daily. And lancets 2/day   rosuvastatin 20 MG tablet Commonly known as: CRESTOR TAKE 1 TABLET BY MOUTH EVERY DAY   Semaglutide (1 MG/DOSE) 4 MG/3ML Sopn Inject 1 mg as directed once a  week. Started by: Dorita Sciara, MD         OBJECTIVE:   Vital Signs: BP 126/84 (BP Location: Left Arm, Patient Position: Sitting, Cuff Size: Large)   Pulse 64   Ht _0  (1.575 m)   Wt 176 lb (79.8 kg)   LMP 07/22/2016   SpO2 99%   BMI 32.19 kg/m   Wt Readings from Last 3 Encounters:  01/18/22 176 lb (79.8 kg)  12/22/21 169 lb 9.6 oz (76.9 kg)  06/14/21 170 lb 6.4 oz (  77.3 kg)     Exam: General: Pt appears well and is in NAD  Neck: General: Supple without adenopathy. Thyroid: Thyroid size normal.  No goiter or nodules appreciated.   Lungs: Clear with good BS bilat   Heart: RRR   Abdomen:  soft, nontender  Extremities: No pretibial edema.   Neuro: MS is good with appropriate affect, pt is alert and Ox3    DM foot exam: 01/18/2022  The skin of the feet is intact without sores or ulcerations. The pedal pulses are 2+ on right and 2+ on left. The sensation is decreased  to a screening 5.07, 10 gram monofilament at the heels ? She was wearing stockings and was unable to take off         DATA REVIEWED:  Lab Results  Component Value Date   HGBA1C 9.6 (A) 01/18/2022   HGBA1C 8.2 (A) 06/14/2021   HGBA1C 8.1 (H) 01/24/2021   Lab Results  Component Value Date   MICROALBUR 0.4 07/31/2008   LDLCALC 45 01/24/2021   CREATININE 0.67 01/23/2021   Lab Results  Component Value Date   MICRALBCREAT 16 03/11/2020     Lab Results  Component Value Date   CHOL 122 01/24/2021   HDL 46 01/24/2021   LDLCALC 45 01/24/2021   LDLDIRECT 159.9 07/31/2008   TRIG 153 (H) 01/24/2021   CHOLHDL 2.7 01/24/2021       Old records , labs and images have been reviewed.    ASSESSMENT / PLAN / RECOMMENDATIONS:   1) Type 2 Diabetes Mellitus, poorly controlled, With macrovascular  complications - Most recent A1c of 9.6 %. Goal A1c < 7.0 %.    -Unfortunately patient has been noted with worsening hyperglycemia -In reviewing CGM download the patient has been noted with  tight/hypoglycemic BG's overnight but has hyperglycemia during the day, we discussed the importance of avoiding sugar sweetened beverages and limiting CHO intake with meals -She has been out of metformin for a few months, will prescribe metformin XR that way it would last her the whole 24 hours, XR formulation does not come in 1000 mg, I have offered to switch to 500 but that way she will take 2 tablets, patient opted to take 750 mg of metformin -I will reduce her Basaglar to prevent hypoglycemia in the future -I have also recommended switching Victoza to Ozempic as below   MEDICATIONS: Start metformin 750 mg XR daily Switch Victoza to Ozempic 1 mg once weekly Continue Farxiga 10 mg daily Decrease Basaglar to 54 units daily  EDUCATION / INSTRUCTIONS: BG monitoring instructions: Patient is instructed to check her blood sugars 3 times a day, before meals. Call [facility] if: BG persistently < 70  I reviewed the Rule of 15 for the treatment of hypoglycemia in detail with the patient. Literature supplied.    2) Diabetic complications:  Eye: Does not have known diabetic retinopathy.  Neuro/ Feet: Does not have known diabetic peripheral neuropathy, but on foot exam today she has been noted with decreased sensation at the heels, patient was unable to take her stocking off, will recheck on next visit without stockings Renal: Patient does not have known baseline CKD. She   is  on an ACEI/ARB at present.       F/U in 6 months     Signed electronically by: Mack Guise, MD  [facility]  [facility] [address] [place], [place] [address] Phone: [phone] FAX: [phone]   CC:  Kathyrn Drown, MD [address] [place] [address] Phone: [phone]  Fax: [phone]  Return to Endocrinology clinic as below: Future Appointments  Date Time Provider [place]  07/19/2022 11:30 AM Rama Mcclintock,  Melanie Crazier, MD [facility] None

## 2022-01-27 ENCOUNTER — Other Ambulatory Visit: Payer: Self-pay | Admitting: Family Medicine

## 2022-01-27 NOTE — Telephone Encounter (Signed)
Urgent Care Patient Requested Prescriptions  Pending Prescriptions Disp Refills   clobetasol ointment (TEMOVATE) 0.05 % [Pharmacy Med Name: CLOBETASOL 0.05% OINTMENT] 60 g 0    Sig: APPLY TO AFFECTED AREA TWICE A DAY     There is no refill protocol information for this order

## 2022-01-30 NOTE — Telephone Encounter (Signed)
Sent mychart message to schedule appointment 01/30/22

## 2022-02-15 ENCOUNTER — Ambulatory Visit (INDEPENDENT_AMBULATORY_CARE_PROVIDER_SITE_OTHER): Payer: BC Managed Care – PPO | Admitting: Family Medicine

## 2022-02-15 VITALS — BP 116/74 | HR 99 | Temp 97.3°F | Ht 62.0 in | Wt 168.0 lb

## 2022-02-15 DIAGNOSIS — E1165 Type 2 diabetes mellitus with hyperglycemia: Secondary | ICD-10-CM | POA: Diagnosis not present

## 2022-02-15 DIAGNOSIS — Z794 Long term (current) use of insulin: Secondary | ICD-10-CM

## 2022-02-15 DIAGNOSIS — I10 Essential (primary) hypertension: Secondary | ICD-10-CM | POA: Diagnosis not present

## 2022-02-15 DIAGNOSIS — E785 Hyperlipidemia, unspecified: Secondary | ICD-10-CM

## 2022-02-15 DIAGNOSIS — E1169 Type 2 diabetes mellitus with other specified complication: Secondary | ICD-10-CM

## 2022-02-15 MED ORDER — LOSARTAN POTASSIUM 25 MG PO TABS: 25.0000 mg | ORAL_TABLET | Freq: Every day | ORAL | 1 refills | Status: DC

## 2022-02-15 MED ORDER — ROSUVASTATIN CALCIUM 20 MG PO TABS: 20.0000 mg | ORAL_TABLET | Freq: Every day | ORAL | 1 refills | Status: DC

## 2022-02-15 NOTE — Progress Notes (Signed)
   Subjective:    Patient ID: Cheryl Middleton, female    DOB: 1965-06-13, 56 y.o.   MRN: [id number]  Hypertension This is a chronic problem. Treatments tried: losartan.  Essential hypertension, benign - Plan: Basic Metabolic Panel, Hepatic Function Panel, Lipid Panel, Microalbumin/Creatinine Ratio, Urine  Hyperlipidemia associated with type 2 diabetes mellitus ([place]) - Plan: Basic Metabolic Panel, Hepatic Function Panel, Lipid Panel, Microalbumin/Creatinine Ratio, Urine  Uncontrolled type 2 diabetes mellitus with hyperglycemia, with long-term current use of insulin ([facility]) - Plan: Basic Metabolic Panel, Hepatic Function Panel, Lipid Panel, Microalbumin/Creatinine Ratio, Urine  Patient for blood pressure check up.  The patient does have hypertension.   Patient relates dietary measures try to minimize salt The importance of healthy diet and activity were discussed Patient relates compliance  The patient was seen today as part of a comprehensive diabetic check up. Patient has diabetes Patient relates good compliance with taking the medication. We discussed their diet and exercise activities  We also discussed the importance of notifying us if any excessively high glucoses or low sugars.    Patient here for follow-up regarding cholesterol.    Patient relates taking medication on a regular basis Denies problems with medication Importance of dietary measures discussed Regular lab work regarding lipid and liver was checked and if needing additional labs was appropriately ordered  We did discuss the importance to get an eye exam on a regular basis Review of Systems     Objective:   Physical Exam  General-in no acute distress Eyes-no discharge Lungs-respiratory rate normal, CTA CV-no murmurs,RRR Extremities skin warm dry no edema Neuro grossly normal Behavior normal, alert       Assessment & Plan:  1. Essential hypertension, benign Blood pressure decent control Continue  current measures  - Basic Metabolic Panel - Hepatic Function Panel - Lipid Panel - Microalbumin/Creatinine Ratio, Urine  2. Hyperlipidemia associated with type 2 diabetes mellitus ([place]) Patient takes her medicine she will watch diet closely she will do lab work in the near future we will share this with her endocrinologist - Basic Metabolic Panel - Hepatic Function Panel - Lipid Panel - Microalbumin/Creatinine Ratio, Urine  3. Uncontrolled type 2 diabetes mellitus with hyperglycemia, with long-term current use of insulin ([place]) She states her sugars at times running high but she is doing her best to keep them under good control she continues to follow-up with endocrinology She will do blood work and urine we will let her know the results plus also let her specialist know - Basic Metabolic Panel - Hepatic Function Panel - Lipid Panel - Microalbumin/Creatinine Ratio, Urine  Follow-up in about 6 months

## 2022-03-05 LAB — BASIC METABOLIC PANEL
BUN/Creatinine Ratio: 16 (ref 9–23)
BUN: 13 mg/dL (ref 6–24)
CO2: 23 mmol/L (ref 20–29)
Calcium: 9.7 mg/dL (ref 8.7–10.2)
Chloride: 105 mmol/L (ref 96–106)
Creatinine, Ser: 0.79 mg/dL (ref 0.57–1.00)
Glucose: 121 mg/dL — ABNORMAL HIGH (ref 70–99)
Potassium: 4.9 mmol/L (ref 3.5–5.2)
Sodium: 144 mmol/L (ref 134–144)
eGFR: 88 mL/min/{1.73_m2} (ref >59)

## 2022-03-05 LAB — LIPID PANEL
Chol/HDL Ratio: 2.2 ratio (ref 0.0–4.4)
Cholesterol, Total: 130 mg/dL (ref 100–199)
HDL: 59 mg/dL (ref >39)
LDL Chol Calc (NIH): 51 mg/dL (ref 0–99)
Triglycerides: 113 mg/dL (ref 0–149)
VLDL Cholesterol Cal: 20 mg/dL (ref 5–40)

## 2022-03-05 LAB — MICROALBUMIN / CREATININE URINE RATIO
Creatinine, Urine: 55 mg/dL
Microalb/Creat Ratio: 47 mg/g creat — ABNORMAL HIGH (ref 0–29)
Microalbumin, Urine: 26.1 ug/mL

## 2022-03-05 LAB — HEPATIC FUNCTION PANEL
ALT: 9 IU/L (ref 0–32)
AST: 18 IU/L (ref 0–40)
Albumin: 4.5 g/dL (ref 3.8–4.9)
Alkaline Phosphatase: 111 IU/L (ref 44–121)
Bilirubin Total: 0.3 mg/dL (ref 0.0–1.2)
Bilirubin, Direct: <0.1 mg/dL (ref 0.00–0.40)
Total Protein: 7.6 g/dL (ref 6.0–8.5)

## 2022-03-19 ENCOUNTER — Other Ambulatory Visit: Payer: Self-pay | Admitting: Family Medicine

## 2022-03-23 ENCOUNTER — Other Ambulatory Visit: Payer: Self-pay | Admitting: Family Medicine

## 2022-03-25 ENCOUNTER — Other Ambulatory Visit: Payer: Self-pay | Admitting: Internal Medicine

## 2022-03-29 ENCOUNTER — Other Ambulatory Visit: Payer: Self-pay | Admitting: Internal Medicine

## 2022-03-29 ENCOUNTER — Encounter: Payer: Self-pay | Admitting: Internal Medicine

## 2022-03-29 DIAGNOSIS — E1165 Type 2 diabetes mellitus with hyperglycemia: Secondary | ICD-10-CM

## 2022-03-29 MED ORDER — DEXCOM G6 TRANSMITTER MISC: 1.0000 | 3 refills | Status: DC

## 2022-04-07 ENCOUNTER — Encounter: Payer: Self-pay | Admitting: *Deleted

## 2022-04-26 ENCOUNTER — Encounter: Payer: Self-pay | Admitting: Family Medicine

## 2022-04-26 ENCOUNTER — Other Ambulatory Visit: Payer: Self-pay | Admitting: Family Medicine

## 2022-04-26 DIAGNOSIS — L509 Urticaria, unspecified: Secondary | ICD-10-CM

## 2022-04-27 NOTE — Telephone Encounter (Signed)
Nurses Certainly it is no fun having ongoing hives  But in this situation we typically recommend a allergist to help try to identify the reason behind the hives Leopard should be aware that even with the best of testing these type of situations are figured out by specialists about 40% of the time but they will be able to help come to some reasonable conclusion plus also recommend additional treatments-so I would recommend a allergist such as Dr. Gillermina Hu practice) Obviously if Farrah is insistent on seeing a dermatologist it would be fine to set her up with dermatology Thanks-Dr. Sallee Lange

## 2022-05-22 ENCOUNTER — Other Ambulatory Visit: Payer: Self-pay

## 2022-05-22 ENCOUNTER — Encounter: Payer: Self-pay | Admitting: Internal Medicine

## 2022-05-22 ENCOUNTER — Ambulatory Visit: Payer: BC Managed Care – PPO | Admitting: Internal Medicine

## 2022-05-22 VITALS — BP 130/80 | HR 101 | Temp 97.6°F | Resp 16 | Ht 61.0 in | Wt 165.0 lb

## 2022-05-22 DIAGNOSIS — L270 Generalized skin eruption due to drugs and medicaments taken internally: Secondary | ICD-10-CM

## 2022-05-22 DIAGNOSIS — Z880 Allergy status to penicillin: Secondary | ICD-10-CM | POA: Diagnosis not present

## 2022-05-22 DIAGNOSIS — L299 Pruritus, unspecified: Secondary | ICD-10-CM | POA: Diagnosis not present

## 2022-05-22 DIAGNOSIS — R21 Rash and other nonspecific skin eruption: Secondary | ICD-10-CM

## 2022-05-22 DIAGNOSIS — T360X5A Adverse effect of penicillins, initial encounter: Secondary | ICD-10-CM

## 2022-05-22 MED ORDER — HYDROCORTISONE 2.5 % EX CREA: TOPICAL_CREAM | Freq: Two times a day (BID) | CUTANEOUS | 5 refills | Status: DC

## 2022-05-22 MED ORDER — TRIAMCINOLONE ACETONIDE 0.1 % EX OINT: TOPICAL_OINTMENT | CUTANEOUS | 1 refills | Status: DC

## 2022-05-22 NOTE — Progress Notes (Signed)
NEW PATIENT  Date of Service/Encounter:  05/22/22  Consult requested by: Kathyrn Drown, MD   Subjective:   Cheryl Middleton (DOB: 1965-11-14) is a 57 y.o. female who presents to the clinic on 05/22/2022 with a chief complaint of Urticaria (Started in August of 2023. Occurs randomly. ) .    History obtained from: chart review and patient.   Rash: Onset 10/2021 with itching and developed blister like rash.  Also wonders if maybe she had hives.  She went to the ER in 11/18/2021 and was noted to have blisters/ulcerations on R arm and legs with widespread edema and erythema.  Given zyrtec, clobetasol and doxycyline.  Reports improvement in rash afterwards but then it returned. She saw Dr. Lacinda Axon 12/22/2021 for persistent rash, mostly on neck and arms and on exam had scattered raised areas of erythema on upper arm. Thought to be possibly hives and started on prednisone, Zyrtec and Pepcid.  She has been taking Zyrtec '10mg'$  daily and Pepcid '20mg'$  daily or BID without much improvement. Last use was 1 week ago.  Therefore, she was referred to allergy.  Denies any new products. Has started Ozempic around the same time but chart review shows the rash visit was around 11/18/2021 and Ozempic was started afterwards 01/18/2022.    Penicillin allergy: Reports occurring almost 20 or more years ago.  She received a penicillin shot and then had diffuse rash all over.  No other symptoms. Can't recall the reason for penicillin. Since then has avoided all penicillin class of antibiotics.    Past Medical History: Past Medical History:  Diagnosis Date   Cancer ([place])    tongue   Diabetes mellitus, type II ([place])    Hyperlipidemia    Malignant neoplasm of tongue ([place])    Tongue   Past Surgical History: Past Surgical History:  Procedure Laterality Date   BREAST EXCISIONAL BIOPSY  12/1989   CESAREAN SECTION  01/2003   COLONOSCOPY N/A 04/20/2017   Procedure: COLONOSCOPY;  Surgeon: Danie Binder, MD;  Location:  [facility];  Service: Endoscopy;  Laterality: N/A;  12:00   TONGUE SURGERY  10/19/1999   tongue cancer   TRACHEOSTOMY      Family History: Family History  Problem Relation Age of Onset   Hypertension Mother    Diabetes Mother    Kidney disease Mother    Hypertension Father    Diabetes Father    Congestive Heart Failure Father    COPD Father    Hypertension Sister    Diabetes Sister    Diabetes Sister        borderline   ADD / ADHD Son    Eczema Son     Social History:  Lives in a 60+ year house Flooring in bedroom: carpet Pets: none Tobacco use/exposure: none Job: Database administrator  Medication List:  Allergies as of 05/22/2022       Reactions   Ciprofloxacin Anaphylaxis   Sulfonamide Derivatives Shortness Of Breath   Ibuprofen Rash   Penicillins Rash   Has patient had a PCN reaction causing immediate rash, facial/tongue/throat swelling, SOB or lightheadedness with hypotension: Yes Has patient had a PCN reaction causing severe rash involving mucus membranes or skin necrosis: no Has patient had a PCN reaction that required hospitalization: yes Has patient had a PCN reaction occurring within the last 10 years: yes If all of the above answers are "NO", then may proceed with Cephalosporin use.        Medication List  Accurate as of May 22, 2022 11:59 AM. If you have any questions, ask your nurse or doctor.          STOP taking these medications    OneTouch Verio test strip Generic drug: glucose blood Stopped by: Larose Kells, MD   Tyler Aas FlexTouch 100 UNIT/ML FlexTouch Pen Generic drug: insulin degludec Stopped by: Larose Kells, MD       TAKE these medications    Aspirin Adult Low Dose 81 MG tablet Generic drug: aspirin EC Take 1 tablet by mouth in the morning.   cetirizine 10 MG tablet Commonly known as: ZYRTEC TAKE 1 TABLET BY MOUTH EVERY DAY   clobetasol ointment 0.05 % Commonly known as: TEMOVATE Apply 1 Application topically  2 (two) times daily.   dapagliflozin propanediol 10 MG Tabs tablet Commonly known as: Farxiga Take 1 tablet (10 mg total) by mouth daily before breakfast.   Dexcom G6 Sensor Misc 1 Units by Other route daily.   Dexcom G6 Transmitter Misc Change every 3 mos   Dexcom G6 Transmitter Misc 1 Device by Does not apply route as directed.   famotidine 20 MG tablet Commonly known as: PEPCID TAKE 1 TABLET BY MOUTH TWICE A DAY   Glucagon Emergency 1 MG Kit Inject if severe low glucose   hydrocortisone 2.5 % cream Apply topically 2 (two) times daily. Apply twice daily for flare ups above neck, maximum 7 days. Started by: Larose Kells, MD   losartan 25 MG tablet Commonly known as: COZAAR Take 1 tablet (25 mg total) by mouth daily.   metFORMIN 750 MG 24 hr tablet Commonly known as: GLUCOPHAGE-XR Take 1 tablet (750 mg total) by mouth daily with breakfast.   rosuvastatin 20 MG tablet Commonly known as: CRESTOR Take 1 tablet (20 mg total) by mouth daily.   Semaglutide (1 MG/DOSE) 4 MG/3ML Sopn Inject 1 mg as directed once a week.   Status COVID-19/Flu A&B Kit Generic drug: Influenza-SARS Antigen Test Inject 1 each into the skin once.   triamcinolone ointment 0.1 % Commonly known as: KENALOG Apply twice daily for flare ups below neck, maximum 10 days. Started by: Larose Kells, MD         REVIEW OF SYSTEMS: Pertinent positives and negatives discussed in HPI.   Objective:   Physical Exam: BP 130/80   Pulse (!) 101   Temp 97.6 F (36.4 C) (Temporal)   Resp 16   Ht '5\' 1"'$  (1.549 m)   Wt 165 lb (74.8 kg)   LMP 07/22/2016   SpO2 96%   BMI 31.18 kg/m  Body mass index is 31.18 kg/m. GEN: alert, well developed HEENT: clear conjunctiva, TM grey and translucent, nose with + inferior turbinate hypertrophy, pink nasal mucosa, slight clear rhinorrhea, no cobblestoning HEART: regular rate and rhythm, no murmur LUNGS: clear to auscultation bilaterally, no coughing, unlabored  respiration ABDOMEN: soft, non distended  SKIN: papular, red lesions on bilateral arms and some hyperpigmented and healing.    Reviewed:  She went to the ER in 11/18/2021 and was noted to have blisters/ulcerations on R arm and legs with widespread edema and erythema.  Given zyrtec, clobetasol and doxycyline.  Reports improvement in rash afterwards but then it returned.   She saw Dr. Lacinda Axon 12/22/2021 for persistent rash, mostly on neck and arms and on exam had scattered raised areas of erythema on upper arm. Thought to be possibly hives and started on prednisone, Zyrtec and Pepcid.  Lonny Prude MD on 01/18/2022  for type II DM and was started on Ozempic for uncontrolled DM.   Skin Testing:  Skin prick testing was placed, which includes aeroallergens/foods, histamine control, and saline control.  Verbal consent was obtained prior to placing test.  Patient tolerated procedure well.  Allergy testing results were read and interpreted by myself, documented by clinical staff. Adequate positive and negative control.  Results discussed with patient/family.  Airborne Adult Perc - 05/22/22 1044     Time Antigen Placed 0930    Allergen Manufacturer Lavella Hammock    Location Back    Number of Test 59    1. Control-Buffer 50% Glycerol Negative    2. Control-Histamine 1 mg/ml 3+    3. Albumin saline Negative    4. Dora Negative    5. [place] Negative    6. Johnson Negative    7. Beason Blue Negative    8. Meadow Fescue Negative    9. Perennial Rye Negative    10. Sweet Vernal Negative    11. Timothy Negative    12. Cocklebur Negative    13. Burweed Marshelder Negative    14. Ragweed, short Negative    15. Ragweed, Giant Negative    16. Plantain,  English Negative    17. Lamb's Quarters Negative    18. Sheep Sorrell Negative    19. Rough Pigweed Negative    20. Marsh Elder, Rough Negative    21. Mugwort, Common Negative    22. Ash mix Negative    23. Birch mix Negative    24. Beech American  Negative    25. Box, Elder Negative    26. Cedar, red Negative    27. Cottonwood, [place] Negative    28. Elm mix Negative    29. Hickory Negative    30. Maple mix Negative    31. Oak, [place] mix Negative    32. Pecan Pollen Negative    33. Pine mix Negative    34. Sycamore Eastern Negative    35. Wilber, Black Pollen Negative    36. Alternaria alternata Negative    37. Cladosporium Herbarum Negative    38. Aspergillus mix Negative    39. Penicillium mix Negative    40. Bipolaris sorokiniana (Helminthosporium) Negative    41. Drechslera spicifera (Curvularia) Negative    42. Mucor plumbeus Negative    43. Fusarium moniliforme 2+    44. Aureobasidium pullulans (pullulara) Negative    45. Rhizopus oryzae Negative    46. Botrytis cinera Negative    47. Epicoccum nigrum Negative    48. Phoma betae Negative    49. Candida Albicans Negative    50. Trichophyton mentagrophytes 2+    51. Mite, D Farinae  5,000 AU/ml Negative    52. Mite, D Pteronyssinus  5,000 AU/ml Negative    53. Cat Hair 10,000 BAU/ml Negative    54.  Dog Epithelia Negative    55. Mixed Feathers Negative    56. Horse Epithelia Negative    57. Cockroach, German Negative    58. Mouse Negative    59. Tobacco Leaf Negative               Assessment:   1. Rash and nonspecific skin eruption   2. Dermatitis due to drug   3. Adverse effect of penicillin, initial encounter   4. Allergy status to penicillin   5. Pruritus     Plan/Recommendations:  Dermatitis - Skin test today 05/2022 positive to mold but not sure if this is the  trigger for a pruritic rash. - I do not think this is typical idiopathic urticaria/hives.  Rash on exam today does not look like hives and there are hyperpigmented areas from where they were present previously. Previous evaluations from chart review have been concerning for blister like rash also. - Use a gentle, unscented cleanser at the end of the bath/shower (such as Dove unscented  bar or baby wash, or Aveeno sensitive body wash). Then rinse, pat half-way dry, and apply a gentle, unscented moisturizer cream (Cerave, Cetaphil, Eucerin, Aveeno)  all over while still damp. Dry skin makes the itching worse. The skin should be moisturized with a gentle, unscented moisturizer at least twice daily.  - Use only unscented liquid laundry detergent. - Apply prescribed topical steroid (triamcinolone 0.1% below neck or hydrocortisone 2.5% above neck) to flared areas after the moisturizer has soaked into the skin (wait at least 30 minutes). Taper off the topical steroids as the skin improves. Do not use topical steroid for more than 7-10 days at a time.  - We will refer you to Dermatology for further evaluation and possible biopsy.   Penicillin Allergy - Recommend doing direct oral challenge to amoxicillin in the future once we sort out the rash.  You are low risk for a reaction and if you pass it, then it opens up a large class of antibiotics that are commonly used for infections.     Return in about 3 months (around 08/22/2022).  Harlon Flor, MD [facility]

## 2022-05-22 NOTE — Patient Instructions (Addendum)
Dermatitis - Skin test today positive to mold but not sure if these are the triggers.  - I do not think this is typical idiopathic urticaria/hives.  Rash on exam today does not look like hives and there are hyperpigmented areas from where they were present previously. Previous evaluations from chart review have been concerning for blister like rash also. - Use a gentle, unscented cleanser at the end of the bath/shower (such as Dove unscented bar or baby wash, or Aveeno sensitive body wash). Then rinse, pat half-way dry, and apply a gentle, unscented moisturizer cream (Cerave, Cetaphil, Eucerin, Aveeno)  all over while still damp. Dry skin makes the itching worse. The skin should be moisturized with a gentle, unscented moisturizer at least twice daily.  - Use only unscented liquid laundry detergent. - Apply prescribed topical steroid (triamcinolone 0.1% below neck or hydrocortisone 2.5% above neck) to flared areas after the moisturizer has soaked into the skin (wait at least 30 minutes). Taper off the topical steroids as the skin improves. Do not use topical steroid for more than 7-10 days at a time.  - We will refer you to Dermatology for further evaluation and possible biopsy.   Penicillin Allergy - Recommend doing direct oral challenge to amoxicillin in the future once we sort out the rash.  You are low risk for a reaction and if you pass it, then it opens up a large class of antibiotics that are commonly used for infections.

## 2022-06-01 ENCOUNTER — Telehealth (INDEPENDENT_AMBULATORY_CARE_PROVIDER_SITE_OTHER): Payer: Self-pay | Admitting: *Deleted

## 2022-06-01 NOTE — Telephone Encounter (Signed)
Procedure: colonoscopy  Height: 5'1.5 Weight: 165lbs        Have you had a colonoscopy before?  04/20/17, Dr. Oneida Alar  Do you have family history of colon cancer?  no  Do you have a family history of polyps? no  Previous colonoscopy with polyps removed? yes  Do you have a history colorectal cancer?   no  Are you diabetic?  yes  Do you have a prosthetic or mechanical heart valve? no  Do you have a pacemaker/defibrillator?   no  Have you had endocarditis/atrial fibrillation?  no  Do you use supplemental oxygen/CPAP?  no  Have you had joint replacement within the last 12 months?  no  Do you tend to be constipated or have to use laxatives?  no   Do you have history of alcohol use? If yes, how much and how often.  no  Do you have history or are you using drugs? If yes, what do are you  using?  no  Have you ever had a stroke/heart attack?  Yes 01/2021  Have you ever had a heart or other vascular stent placed,?no  Do you take weight loss medication? no  female patients,: have you had a hysterectomy? no                              are you post menopausal?                                do you still have your menstrual cycle? no    Date of last menstrual period? 09/2018  Do you take any blood-thinning medications such as: (Plavix, aspirin, Coumadin, Aggrenox, Brilinta, Xarelto, Eliquis, Pradaxa, Savaysa or Effient)? Aspirin '81mg'$   If yes we need the name, milligram, dosage and who is prescribing doctor:               Current Outpatient Medications  Medication Sig Dispense Refill   ASPIRIN ADULT LOW DOSE 81 MG EC tablet Take 1 tablet by mouth in the morning.     Continuous Blood Gluc Sensor (DEXCOM G6 SENSOR) MISC 1 Units by Other route daily.     Continuous Blood Gluc Transmit (DEXCOM G6 TRANSMITTER) MISC Change every 3 mos 1 each 3   dapagliflozin propanediol (FARXIGA) 10 MG TABS tablet Take 1 tablet (10 mg total) by mouth daily before breakfast. 90 tablet 3   Glucagon,  rDNA, (GLUCAGON EMERGENCY) 1 MG KIT Inject if severe low glucose 1 kit 1   hydrocortisone 2.5 % cream Apply topically 2 (two) times daily. Apply twice daily for flare ups above neck, maximum 7 days. 30 g 5   Insulin Degludec (TRESIBA) 100 UNIT/ML SOLN Inject into the skin. 40 units daily     losartan (COZAAR) 25 MG tablet Take 1 tablet (25 mg total) by mouth daily. 90 tablet 1   metFORMIN (GLUCOPHAGE-XR) 750 MG 24 hr tablet Take 1 tablet (750 mg total) by mouth daily with breakfast. 90 tablet 3   rosuvastatin (CRESTOR) 20 MG tablet Take 1 tablet (20 mg total) by mouth daily. 90 tablet 1   Semaglutide, 1 MG/DOSE, 4 MG/3ML SOPN Inject 1 mg as directed once a week. 6 mL 3   No current facility-administered medications for this visit.    Allergies  Allergen Reactions   Ciprofloxacin Anaphylaxis   Sulfonamide Derivatives Shortness Of Breath   Ibuprofen Rash  Penicillins Rash    Has patient had a PCN reaction causing immediate rash, facial/tongue/throat swelling, SOB or lightheadedness with hypotension: Yes Has patient had a PCN reaction causing severe rash involving mucus membranes or skin necrosis: no Has patient had a PCN reaction that required hospitalization: yes Has patient had a PCN reaction occurring within the last 10 years: yes If all of the above answers are "NO", then may proceed with Cephalosporin use.

## 2022-06-09 ENCOUNTER — Encounter: Payer: Self-pay | Admitting: *Deleted

## 2022-06-09 NOTE — Telephone Encounter (Signed)
Apt scheduled.  

## 2022-07-09 ENCOUNTER — Encounter: Payer: Self-pay | Admitting: Internal Medicine

## 2022-07-10 ENCOUNTER — Other Ambulatory Visit (HOSPITAL_COMMUNITY): Payer: Self-pay

## 2022-07-10 ENCOUNTER — Telehealth: Payer: Self-pay | Admitting: Pharmacy Technician

## 2022-07-10 NOTE — Telephone Encounter (Addendum)
Pharmacy Patient Advocate Encounter   Received notification from Pt avice requests/CMA that prior authorization for Dexcom sensors and Transmitter is required/requested. (Renewal) - appears to be diff ins.    Started PA on 07/10/22 to (ins) [employer] via [employer] or (Medicaid) confirmation # [id number] - PA Case ID: [id number]   Unfortunately the PA form requires the pt to be on an intensive insulin regimen An intensive insulin regimen is defined as multiple daily injections of insulin (i.e., 3 or more injections per day)or using a pump.  I don't see that she's taking multiple insulin injections. (Was she on a different regimen in the past that may qualify) Even if she is, as a continuation, she must experienced improved glycemic control or decreased hypoglycemia episodes while using a continuous glucose monitor (CGM). I see that her last A1C reading was higher. Can you tell us how she's met these requirements so that we can relay that to the ins for the PA?

## 2022-07-17 ENCOUNTER — Encounter: Payer: Self-pay | Admitting: Gastroenterology

## 2022-07-17 ENCOUNTER — Ambulatory Visit: Payer: BC Managed Care – PPO | Admitting: Gastroenterology

## 2022-07-17 VITALS — BP 115/74 | HR 97 | Temp 97.5°F | Ht 61.5 in | Wt 159.0 lb

## 2022-07-17 DIAGNOSIS — K5900 Constipation, unspecified: Secondary | ICD-10-CM | POA: Diagnosis not present

## 2022-07-17 DIAGNOSIS — Z86010 Personal history of colonic polyps: Secondary | ICD-10-CM | POA: Diagnosis not present

## 2022-07-17 NOTE — H&P (View-Only) (Signed)
  GI Office Note    Referring Provider: Luking, Scott A, MD Primary Care Physician:  Luking, Scott A, MD  Primary Gastroenterologist: Charles K. Carver, DO  Chief Complaint   Chief Complaint  Patient presents with   New Patient (Initial Visit)    Ov before colonoscopy   History of Present Illness   Cheryl Middleton is a 57 y.o. female presenting today at the request of Luking, Scott A, MD for evaluation prior to colonoscopy given history of TIA/CVA in 2022.   Last colonoscopy in February 2019: -2 (2-3mm) polyps in the rectum -Redundant left colon -Mild ascending diverticulosis -External hemorrhoids -Pathology revealed tubular adenoma and hyperplastic polyp. -Recommended repeat colonoscopy in 20 2024 in 2026.  History of CVA in November 2022.  Presented for right-sided upper and lower extremity paresthesia.  She had instability and slurred speech on presentation.  CT without acute intracranial finding.  Subsequent MRI showed 2 foci of restricted diffusion within the pons bilaterally.  Had follow-up MRI head and neck and echocardiogram.  MRI head and neck showed moderate narrowing of the bilateral cavernous and supraclinoid ICAs possible left A1.  Echo with EF 60 to 65% with mild calcification aortic valve without regurgitation.  Neurology recommended patient be started on Eliquis.  Today: No changes in bowel habits.   Possible has mild constipation. States she does not go as often as she should. Has been working on her diet. Okay go once every 2 days. Not having any bloating or abdominal pain. No nausea or vomiting. No melena or brbpr. Has a good appetite. No unintentional weight loss.   Blood sugars are improving. Taking Tresiba daily - morning. And Ozempic once weekly. Metformin - morning.   No chest pain, shortness of breath, dizziness, lightheadedness, fatigue. No residual deficits from her prior stroke.    Current Outpatient Medications  Medication Sig Dispense Refill    ASPIRIN ADULT LOW DOSE 81 MG EC tablet Take 1 tablet by mouth in the morning.     Continuous Blood Gluc Sensor (DEXCOM G6 SENSOR) MISC 1 Units by Other route daily.     Continuous Blood Gluc Transmit (DEXCOM G6 TRANSMITTER) MISC Change every 3 mos 1 each 3   dapagliflozin propanediol (FARXIGA) 10 MG TABS tablet Take 1 tablet (10 mg total) by mouth daily before breakfast. 90 tablet 3   Glucagon, rDNA, (GLUCAGON EMERGENCY) 1 MG KIT Inject if severe low glucose 1 kit 1   Insulin Degludec (TRESIBA) 100 UNIT/ML SOLN Inject 30 Units/mL into the skin. 40 units daily     losartan (COZAAR) 25 MG tablet Take 1 tablet (25 mg total) by mouth daily. 90 tablet 1   metFORMIN (GLUCOPHAGE-XR) 750 MG 24 hr tablet Take 1 tablet (750 mg total) by mouth daily with breakfast. 90 tablet 3   rosuvastatin (CRESTOR) 20 MG tablet Take 1 tablet (20 mg total) by mouth daily. 90 tablet 1   Semaglutide, 1 MG/DOSE, 4 MG/3ML SOPN Inject 1 mg as directed once a week. 6 mL 3   hydrocortisone 2.5 % cream Apply topically 2 (two) times daily. Apply twice daily for flare ups above neck, maximum 7 days. (Patient not taking: Reported on 07/17/2022) 30 g 5   No current facility-administered medications for this visit.    Past Medical History:  Diagnosis Date   Cancer (HCC)    tongue   Diabetes mellitus, type II (HCC)    Hyperlipidemia    Malignant neoplasm of tongue (HCC)    Tongue      Past Surgical History:  Procedure Laterality Date   BREAST EXCISIONAL BIOPSY  12/1989   CESAREAN SECTION  01/2003   COLONOSCOPY N/A 04/20/2017   Procedure: COLONOSCOPY;  Surgeon: Fields, Sandi L, MD;  Location: [facility];  Service: Endoscopy;  Laterality: N/A;  12:00   TONGUE SURGERY  10/19/1999   tongue cancer   TRACHEOSTOMY      Family History  Problem Relation Age of Onset   Hypertension Mother    Diabetes Mother    Kidney disease Mother    Hypertension Father    Diabetes Father    Congestive Heart Failure Father    COPD Father     Hypertension Sister    Diabetes Sister    Diabetes Sister        borderline   ADD / ADHD Son    Eczema Son     Allergies as of 07/17/2022 - Review Complete 07/17/2022  Allergen Reaction Noted   Ciprofloxacin Anaphylaxis    Sulfonamide derivatives Shortness Of Breath    Ibuprofen Rash 05/25/2015   Penicillins Rash     Social History   Socioeconomic History   Marital status: Divorced    Spouse name: Not on file   Number of children: Not on file   Years of education: Not on file   Highest education level: Not on file  Occupational History   Not on file  Tobacco Use   Smoking status: Never   Smokeless tobacco: Never  Vaping Use   Vaping Use: Never used  Substance and Sexual Activity   Alcohol use: No   Drug use: No   Sexual activity: Yes    Birth control/protection: Post-menopausal  Other Topics Concern   Not on file  Social History Narrative   Not on file   Social Determinants of Health   Financial Resource Strain: Not on file  Food Insecurity: Not on file  Transportation Needs: Not on file  Physical Activity: Not on file  Stress: Not on file  Social Connections: Not on file  Intimate Partner Violence: Not on file     Review of Systems   Gen: Denies any fever, chills, fatigue, weight loss, lack of appetite.  CV: Denies chest pain, heart palpitations, peripheral edema, syncope.  Resp: Denies shortness of breath at rest or with exertion. Denies wheezing or cough.  GI: see HPI GU : Denies urinary burning, urinary frequency, urinary hesitancy MS: Denies joint pain, muscle weakness, cramps, or limitation of movement.  Derm: Denies rash, itching, dry skin Psych: Denies depression, anxiety, memory loss, and confusion Heme: Denies bruising, bleeding, and enlarged lymph nodes.   Physical Exam   BP 115/74   Pulse 97   Temp (!) 97.5 F (36.4 C)   Ht 5' 1.5" (1.562 m)   Wt 159 lb (72.1 kg)   LMP 07/22/2016   BMI 29.56 kg/m   General:   Alert and  oriented. Pleasant and cooperative. Well-nourished and well-developed.  Head:  Normocephalic and atraumatic. Eyes:  Without icterus, sclera clear and conjunctiva pink.  Ears:  Normal auditory acuity. Mouth:  No deformity or lesions, oral mucosa pink.  Lungs:  Clear to auscultation bilaterally. No wheezes, rales, or rhonchi. No distress.  Heart:  S1, S2 present without murmurs appreciated.  Abdomen:  +BS, soft, non-tender and non-distended. No HSM noted. No guarding or rebound. No masses appreciated.  Rectal:  Deferred Msk:  Symmetrical without gross deformities. Normal posture. Extremities:  Without edema. Neurologic:  Alert and  oriented x4;    grossly normal neurologically. Skin:  Intact without significant lesions or rashes. Psych:  Alert and cooperative. Normal mood and affect.  Assessment   Cheryl Middleton is a 57 y.o. female with a history of CVA in 2022, diabetes, HTN, HLD, tongue cancer presenting today for evaluation prior to scheduling surveillance colonoscopy.  History of colon polyps: History of tubular adenoma and hyperplastic polyp in February 2019.  Currently without any alarm symptoms. Denies melena, BRBPR, lack of appetite, early satiety, nausea, vomiting unintentional weight loss, change in bowel habits, fatigue.  Will proceed with scheduling surveillance colonoscopy.  She has no residual deficits from her prior CVA in 2022.  Mild constipation: Feels as though she should go more often than she is going however there has been no significant change in bowel habits. BM every 1-2 days.  Denies any bloating, nausea, or abdominal pain.  Advised to trial Benefiber 2 to 3 teaspoons daily to assist with better bowel regularity.  PLAN   Proceed with colonoscopy with propofol by Dr. Carver in near future: the risks, benefits, and alternatives have been discussed with the patient in detail. The patient states understanding and desires to proceed. ASA 3 Hold metformin the night prior to  and morning of procedure Hold semaglutide for 1 week Hold Tresiba morning of procedure  Hold Farxiga for 3 days Start Benefiber 2-3 teaspoons daily in 8 ounces of water.    Cheyrl Buley, MSN, FNP-BC, AGACNP-BC [facility] 

## 2022-07-17 NOTE — Progress Notes (Signed)
GI Office Note    Referring Provider: Babs Sciara, MD Primary Care Physician:  Babs Sciara, MD  Primary Gastroenterologist: Hennie Duos. Marletta Lor, DO  Chief Complaint   Chief Complaint  Patient presents with   New Patient (Initial Visit)    Ov before colonoscopy   History of Present Illness   Cheryl Middleton is a 56 y.o. female presenting today at the request of Luking, Jonna Coup, MD for evaluation prior to colonoscopy given history of TIA/CVA in 2022.   Last colonoscopy in February 2019: -2 (2-53mm) polyps in the rectum -Redundant left colon -Mild ascending diverticulosis -External hemorrhoids -Pathology revealed tubular adenoma and hyperplastic polyp. -Recommended repeat colonoscopy in 20 2024 in 2026.  History of CVA in November 2022.  Presented for right-sided upper and lower extremity paresthesia.  She had instability and slurred speech on presentation.  CT without acute intracranial finding.  Subsequent MRI showed 2 foci of restricted diffusion within the pons bilaterally.  Had follow-up MRI head and neck and echocardiogram.  MRI head and neck showed moderate narrowing of the bilateral cavernous and supraclinoid ICAs possible left A1.  Echo with EF 60 to 65% with mild calcification aortic valve without regurgitation.  Neurology recommended patient be started on Eliquis.  Today: No changes in bowel habits.   Possible has mild constipation. States she does not go as often as she should. Has been working on her diet. Okay go once every 2 days. Not having any bloating or abdominal pain. No nausea or vomiting. No melena or brbpr. Has a good appetite. No unintentional weight loss.   Blood sugars are improving. Taking [place] daily - morning. And Ozempic once weekly. Metformin - morning.   No chest pain, shortness of breath, dizziness, lightheadedness, fatigue. No residual deficits from her prior stroke.    Current Outpatient Medications  Medication Sig Dispense Refill    ASPIRIN ADULT LOW DOSE 81 MG EC tablet Take 1 tablet by mouth in the morning.     Continuous Blood Gluc Sensor (DEXCOM G6 SENSOR) MISC 1 Units by Other route daily.     Continuous Blood Gluc Transmit (DEXCOM G6 TRANSMITTER) MISC Change every 3 mos 1 each 3   dapagliflozin propanediol (FARXIGA) 10 MG TABS tablet Take 1 tablet (10 mg total) by mouth daily before breakfast. 90 tablet 3   Glucagon, rDNA, (GLUCAGON EMERGENCY) 1 MG KIT Inject if severe low glucose 1 kit 1   Insulin Degludec (TRESIBA) 100 UNIT/ML SOLN Inject 30 Units/mL into the skin. 40 units daily     losartan (COZAAR) 25 MG tablet Take 1 tablet (25 mg total) by mouth daily. 90 tablet 1   metFORMIN (GLUCOPHAGE-XR) 750 MG 24 hr tablet Take 1 tablet (750 mg total) by mouth daily with breakfast. 90 tablet 3   rosuvastatin (CRESTOR) 20 MG tablet Take 1 tablet (20 mg total) by mouth daily. 90 tablet 1   Semaglutide, 1 MG/DOSE, 4 MG/3ML SOPN Inject 1 mg as directed once a week. 6 mL 3   hydrocortisone 2.5 % cream Apply topically 2 (two) times daily. Apply twice daily for flare ups above neck, maximum 7 days. (Patient not taking: Reported on 07/17/2022) 30 g 5   No current facility-administered medications for this visit.    Past Medical History:  Diagnosis Date   Cancer (HCC)    tongue   Diabetes mellitus, type II (HCC)    Hyperlipidemia    Malignant neoplasm of tongue (HCC)    Tongue  Past Surgical History:  Procedure Laterality Date   BREAST EXCISIONAL BIOPSY  12/1989   CESAREAN SECTION  01/2003   COLONOSCOPY N/A 04/20/2017   Procedure: COLONOSCOPY;  Surgeon: West Bali, MD;  Location: [facility];  Service: Endoscopy;  Laterality: N/A;  12:00   TONGUE SURGERY  10/19/1999   tongue cancer   TRACHEOSTOMY      Family History  Problem Relation Age of Onset   Hypertension Mother    Diabetes Mother    Kidney disease Mother    Hypertension Father    Diabetes Father    Congestive Heart Failure Father    COPD Father     Hypertension Sister    Diabetes Sister    Diabetes Sister        borderline   ADD / ADHD Son    Eczema Son     Allergies as of 07/17/2022 - Review Complete 07/17/2022  Allergen Reaction Noted   Ciprofloxacin Anaphylaxis    Sulfonamide derivatives Shortness Of Breath    Ibuprofen Rash 05/25/2015   Penicillins Rash     Social History   Socioeconomic History   Marital status: Divorced    Spouse name: Not on file   Number of children: Not on file   Years of education: Not on file   Highest education level: Not on file  Occupational History   Not on file  Tobacco Use   Smoking status: Never   Smokeless tobacco: Never  Vaping Use   Vaping Use: Never used  Substance and Sexual Activity   Alcohol use: No   Drug use: No   Sexual activity: Yes    Birth control/protection: Post-menopausal  Other Topics Concern   Not on file  Social History Narrative   Not on file   Social Determinants of Health   Financial Resource Strain: Not on file  Food Insecurity: Not on file  Transportation Needs: Not on file  Physical Activity: Not on file  Stress: Not on file  Social Connections: Not on file  Intimate Partner Violence: Not on file     Review of Systems   Gen: Denies any fever, chills, fatigue, weight loss, lack of appetite.  CV: Denies chest pain, heart palpitations, peripheral edema, syncope.  Resp: Denies shortness of breath at rest or with exertion. Denies wheezing or cough.  GI: see HPI GU : Denies urinary burning, urinary frequency, urinary hesitancy MS: Denies joint pain, muscle weakness, cramps, or limitation of movement.  Derm: Denies rash, itching, dry skin Psych: Denies depression, anxiety, memory loss, and confusion Heme: Denies bruising, bleeding, and enlarged lymph nodes.   Physical Exam   BP 115/74   Pulse 97   Temp (!) 97.5 F (36.4 C)   Ht 5' 1.5" (1.562 m)   Wt 159 lb (72.1 kg)   LMP 07/22/2016   BMI 29.56 kg/m   General:   Alert and  oriented. Pleasant and cooperative. Well-nourished and well-developed.  Head:  Normocephalic and atraumatic. Eyes:  Without icterus, sclera clear and conjunctiva pink.  Ears:  Normal auditory acuity. Mouth:  No deformity or lesions, oral mucosa pink.  Lungs:  Clear to auscultation bilaterally. No wheezes, rales, or rhonchi. No distress.  Heart:  S1, S2 present without murmurs appreciated.  Abdomen:  +BS, soft, non-tender and non-distended. No HSM noted. No guarding or rebound. No masses appreciated.  Rectal:  Deferred Msk:  Symmetrical without gross deformities. Normal posture. Extremities:  Without edema. Neurologic:  Alert and  oriented x4;  grossly normal neurologically. Skin:  Intact without significant lesions or rashes. Psych:  Alert and cooperative. Normal mood and affect.  Assessment   Cheryl Middleton is a 57 y.o. female with a history of CVA in 2022, diabetes, HTN, HLD, tongue cancer presenting today for evaluation prior to scheduling surveillance colonoscopy.  History of colon polyps: History of tubular adenoma and hyperplastic polyp in February 2019.  Currently without any alarm symptoms. Denies melena, BRBPR, lack of appetite, early satiety, nausea, vomiting unintentional weight loss, change in bowel habits, fatigue.  Will proceed with scheduling surveillance colonoscopy.  She has no residual deficits from her prior CVA in 2022.  Mild constipation: Feels as though she should go more often than she is going however there has been no significant change in bowel habits. BM every 1-2 days.  Denies any bloating, nausea, or abdominal pain.  Advised to trial Benefiber 2 to 3 teaspoons daily to assist with better bowel regularity.  PLAN   Proceed with colonoscopy with propofol by Dr. Marletta Lor in near future: the risks, benefits, and alternatives have been discussed with the patient in detail. The patient states understanding and desires to proceed. ASA 3 Hold metformin the night prior to  and morning of procedure Hold semaglutide for 1 week Hold Tresiba morning of procedure  Hold Farxiga for 3 days Start Benefiber 2-3 teaspoons daily in 8 ounces of water.    Brooke Bonito, MSN, FNP-BC, AGACNP-BC [facility]

## 2022-07-17 NOTE — Patient Instructions (Addendum)
We are scheduling you for a colonoscopy in the near future with Dr. Marletta Lor.  You will receive separate detailed written instructions regarding your prep: Hold Metformin the night prior to and morning of procedure Hold Semaglutide for 1 week Hold Tresiba morning of procedure.  Hold Marcelline De[place] for 3 days  Start benefiber 2-3 teaspoons daily in 8 oz of fluid of your choice.   It was a pleasure to see you today. I want to create trusting relationships with patients. If you receive a survey regarding your visit,  I greatly appreciate you taking time to fill this out on paper or through your MyChart. I value your feedback.  Brooke Bonito, MSN, FNP-BC, AGACNP-BC [facility]

## 2022-07-18 ENCOUNTER — Telehealth: Payer: Self-pay | Admitting: *Deleted

## 2022-07-18 MED ORDER — NA SULFATE-K SULFATE-MG SULF 17.5-3.13-1.6 GM/177ML PO SOLN: ORAL | 0 refills | Status: DC

## 2022-07-18 NOTE — Progress Notes (Unsigned)
Name: Cheryl Middleton  Age/ Sex: 57 y.o., female   MRN/ DOB: [id number], May 14, 1965     PCP: Babs Sciara, MD   Reason for Endocrinology Evaluation: Type 2 Diabetes Mellitus  Initial Endocrine Consultative Visit: 06/17/2020    PATIENT IDENTIFIER: Cheryl Middleton is a 57 y.o. female with a past medical history of DM, HTN , CVA . The patient has followed with Endocrinology clinic since 06/17/2020 for consultative assistance with management of her diabetes.  DIABETIC HISTORY:  Cheryl Middleton was diagnosed with DM 1987. Started insulin therapy  2021. Her hemoglobin A1c has ranged from 8.0% in 2022, peaking at 13.0% in 2019.  Patient has followed by Dr. Everardo All from 2022 until March 2023   SUBJECTIVE:   During the last visit (01/18/2022): A1c 9.6%     Today (07/19/2022): Ms. Cannan is here for a follow up on diabetes management.  She checks her blood sugars multiple  times daily. The patient has  had hypoglycemic episodes since the last clinic visit, which typically occur    Denies nausea vomiting or diarrhea  She has noted weight loss   HOME DIABETES REGIMEN:  Metformin 750 mg  daily  Farxiga 10 mg daily  Ozempic 1 mg weekly  Tresiba  30 units    Statin: yes ACE-I/ARB: yes   CONTINUOUS GLUCOSE MONITORING RECORD INTERPRETATION    Dates of Recording: 4/16-4/29/2024  Sensor description: dexcom  Results statistics:   CGM use % of time 93  Average and SD 143/47  Time in range   79 %  % Time Above 180  17  % Time above 250 3  % Time Below target 1   Glycemic patterns summary: Bg's trend down at night and fluctuate during the day   Hyperglycemic episodes  postprandial   Hypoglycemic episodes occurred  at night   Overnight periods:  trends down      DIABETIC COMPLICATIONS: Microvascular complications:   Denies: CKD  Last Eye Exam: Completed 03/2021  Macrovascular complications:  CVA Denies: CAD, PVD   HISTORY:  Past Medical History:  Past  Medical History:  Diagnosis Date   Cancer (HCC)    tongue   Diabetes mellitus, type II (HCC)    Hyperlipidemia    Malignant neoplasm of tongue (HCC)    Tongue   Past Surgical History:  Past Surgical History:  Procedure Laterality Date   BREAST EXCISIONAL BIOPSY  12/1989   CESAREAN SECTION  01/2003   COLONOSCOPY N/A 04/20/2017   Procedure: COLONOSCOPY;  Surgeon: West Bali, MD;  Location: [facility];  Service: Endoscopy;  Laterality: N/A;  12:00   TONGUE SURGERY  10/19/1999   tongue cancer   TRACHEOSTOMY     Social History:  reports that she has never smoked. She has never used smokeless tobacco. She reports that she does not drink alcohol and does not use drugs. Family History:  Family History  Problem Relation Age of Onset   Hypertension Mother    Diabetes Mother    Kidney disease Mother    Hypertension Father    Diabetes Father    Congestive Heart Failure Father    COPD Father    Hypertension Sister    Diabetes Sister    Diabetes Sister        borderline   ADD / ADHD Son    Eczema Son      HOME MEDICATIONS: Allergies as of 07/19/2022       Reactions   Ciprofloxacin Anaphylaxis  Sulfonamide Derivatives Shortness Of Breath   Ibuprofen Rash   Penicillins Rash   Has patient had a PCN reaction causing immediate rash, facial/tongue/throat swelling, SOB or lightheadedness with hypotension: Yes Has patient had a PCN reaction causing severe rash involving mucus membranes or skin necrosis: no Has patient had a PCN reaction that required hospitalization: yes Has patient had a PCN reaction occurring within the last 10 years: yes If all of the above answers are "NO", then may proceed with Cephalosporin use.        Medication List        Accurate as of Jul 19, 2022 11:30 AM. If you have any questions, ask your nurse or doctor.          Aspirin Adult Low Dose 81 MG tablet Generic drug: aspirin EC Take 1 tablet by mouth in the morning.   dapagliflozin  propanediol 10 MG Tabs tablet Commonly known as: Farxiga Take 1 tablet (10 mg total) by mouth daily before breakfast.   Dexcom G6 Sensor Misc 1 Units by Other route daily.   Dexcom G6 Transmitter Misc Change every 3 mos   Glucagon Emergency 1 MG Kit Inject if severe low glucose   hydrocortisone 2.5 % cream Apply topically 2 (two) times daily. Apply twice daily for flare ups above neck, maximum 7 days.   losartan 25 MG tablet Commonly known as: COZAAR Take 1 tablet (25 mg total) by mouth daily.   metFORMIN 750 MG 24 hr tablet Commonly known as: GLUCOPHAGE-XR Take 1 tablet (750 mg total) by mouth daily with breakfast.   Na Sulfate-K Sulfate-Mg Sulf 17.5-3.13-1.6 GM/177ML Soln As directed   rosuvastatin 20 MG tablet Commonly known as: CRESTOR Take 1 tablet (20 mg total) by mouth daily.   Semaglutide (1 MG/DOSE) 4 MG/3ML Sopn Inject 1 mg as directed once a week.   Tresiba 100 UNIT/ML Soln Generic drug: Insulin Degludec Inject 30 Units/mL into the skin. 40 units daily         OBJECTIVE:   Vital Signs: BP 120/70 (BP Location: Left Arm, Patient Position: Sitting, Cuff Size: Small)   Pulse 78   Ht 5' 1.5" (1.562 m)   Wt 158 lb (71.7 kg)   LMP 07/22/2016   SpO2 96%   BMI 29.37 kg/m   Wt Readings from Last 3 Encounters:  07/19/22 158 lb (71.7 kg)  07/17/22 159 lb (72.1 kg)  05/22/22 165 lb (74.8 kg)     Exam: General: Pt appears well and is in NAD  Neck: General: Supple without adenopathy. Thyroid: Thyroid size normal.  No goiter or nodules appreciated.   Lungs: Clear with good BS bilat   Heart: RRR   Abdomen:  soft, nontender  Extremities: No pretibial edema.   Neuro: MS is good with appropriate affect, pt is alert and Ox3    DM foot exam: 01/18/2022  The skin of the feet is intact without sores or ulcerations. The pedal pulses are 2+ on right and 2+ on left. The sensation is decreased  to a screening 5.07, 10 gram monofilament at the heels ? She was  wearing stockings and was unable to take off         DATA REVIEWED:  Lab Results  Component Value Date   HGBA1C 7.2 (A) 07/19/2022   HGBA1C 9.6 (A) 01/18/2022   HGBA1C 8.2 (A) 06/14/2021   Lab Results  Component Value Date   MICROALBUR 0.4 07/31/2008   LDLCALC 51 03/03/2022   CREATININE 0.79 03/03/2022  Lab Results  Component Value Date   MICRALBCREAT 47 (H) 03/03/2022     Lab Results  Component Value Date   CHOL 130 03/03/2022   HDL 59 03/03/2022   LDLCALC 51 03/03/2022   LDLDIRECT 159.9 07/31/2008   TRIG 113 03/03/2022   CHOLHDL 2.2 03/03/2022          ASSESSMENT / PLAN / RECOMMENDATIONS:   1) Type 2 Diabetes Mellitus, Sub-optimally  controlled, With microlabuminuria and macrovascular  complications - Most recent A1c of 7.2%. Goal A1c < 7.0 %.    - A1c has trended down from 9.6% to 7.2%  -Praised patient improved glycemic control -Will switch Victoza to Ozempic, which she is tolerating well -Will increase Ozempic as below -Decrease Evaristo Bury as below to prevent hypoglycemia   MEDICATIONS: Continue metformin 750 mg XR daily Continue Farxiga 10 mg daily Increase Ozempic 2 mg weekly Decrease Tresiba 22 units daily  EDUCATION / INSTRUCTIONS: BG monitoring instructions: Patient is instructed to check her blood sugars 3 times a day, before meals. Call [facility] if: BG persistently < 70  I reviewed the Rule of 15 for the treatment of hypoglycemia in detail with the patient. Literature supplied.    2) Diabetic complications:  Eye: Does not have known diabetic retinopathy.  Neuro/ Feet: Does not have known diabetic peripheral neuropathy Renal: Patient does not have known baseline CKD. She  is  on an ACEI/ARB at present.    3) Microalbuminuria:  -This has been intermittently elevated since 2019.  Last checkup was 02/2022, which shows slight elevation -Discussed the importance of continuing to optimize glucose control to prevent further  renal damage -She is already on losartan   F/U in 6 months     Signed electronically by: Lyndle Herrlich, MD  [facility] Endocrinology  [facility] [address] [place] [place], [place] [address] Phone: [phone] FAX: [phone]   CC: Babs Sciara, MD [address] [place] [address] Phone: [phone]  Fax: [phone]  Return to Endocrinology clinic as below: Future Appointments  Date Time Provider Department Center  08/11/2022  9:30 AM [facility] PAT 2 [facility] None  08/16/2022  8:20 AM Gerda Diss, Jonna Coup, MD [facility]

## 2022-07-18 NOTE — Telephone Encounter (Signed)
Spoke with pt. Scheduled for TCS with Dr. Marletta Lor ASA 3 on 5/28. Aware will send instruction via mychart with pre-op appt. Rx for prep to pharmacy.

## 2022-07-19 ENCOUNTER — Encounter: Payer: Self-pay | Admitting: Internal Medicine

## 2022-07-19 ENCOUNTER — Ambulatory Visit: Payer: BC Managed Care – PPO | Admitting: Internal Medicine

## 2022-07-19 ENCOUNTER — Other Ambulatory Visit: Payer: Self-pay | Admitting: Internal Medicine

## 2022-07-19 VITALS — BP 120/70 | HR 78 | Ht 61.5 in | Wt 158.0 lb

## 2022-07-19 DIAGNOSIS — E1129 Type 2 diabetes mellitus with other diabetic kidney complication: Secondary | ICD-10-CM | POA: Diagnosis not present

## 2022-07-19 DIAGNOSIS — R809 Proteinuria, unspecified: Secondary | ICD-10-CM | POA: Diagnosis not present

## 2022-07-19 DIAGNOSIS — Z794 Long term (current) use of insulin: Secondary | ICD-10-CM | POA: Diagnosis not present

## 2022-07-19 DIAGNOSIS — E1159 Type 2 diabetes mellitus with other circulatory complications: Secondary | ICD-10-CM

## 2022-07-19 LAB — POCT GLYCOSYLATED HEMOGLOBIN (HGB A1C): Hemoglobin A1C: 7.2 % — AB (ref 4.0–5.6)

## 2022-07-19 MED ORDER — SEMAGLUTIDE (2 MG/DOSE) 8 MG/3ML ~~LOC~~ SOPN
2.0000 mg | PEN_INJECTOR | SUBCUTANEOUS | 3 refills | Status: DC
Start: 2022-07-19 — End: 2023-06-29

## 2022-07-19 MED ORDER — DEXCOM G6 SENSOR MISC: 1.0000 [IU] | Freq: Every day | 3 refills | Status: DC

## 2022-07-19 MED ORDER — DAPAGLIFLOZIN PROPANEDIOL 10 MG PO TABS: 10.0000 mg | ORAL_TABLET | Freq: Every day | ORAL | 3 refills | Status: DC

## 2022-07-19 MED ORDER — INSULIN DEGLUDEC 100 UNIT/ML ~~LOC~~ SOLN: 22.0000 [IU] | Freq: Every day | SUBCUTANEOUS | 6 refills | Status: DC

## 2022-07-19 MED ORDER — DEXCOM G6 TRANSMITTER MISC: 3 refills | Status: DC

## 2022-07-19 MED ORDER — METFORMIN HCL ER 750 MG PO TB24: 750.0000 mg | ORAL_TABLET | Freq: Every day | ORAL | 3 refills | Status: DC

## 2022-07-19 NOTE — Patient Instructions (Addendum)
Continue Metformin 750 mg XR, 1 tablet daily  Continue Farxiga 10 mg daily  Continue Ozempic 2 mg ONCE weekly  Continue Tresiba 22 units once daily      HOW TO TREAT LOW BLOOD SUGARS (Blood sugar LESS THAN 70 MG/DL) Please follow the RULE OF 15 for the treatment of hypoglycemia treatment (when your (blood sugars are less than 70 mg/dL)   STEP 1: Take 15 grams of carbohydrates when your blood sugar is low, which includes:  3-4 GLUCOSE TABS  OR 3-4 OZ OF JUICE OR REGULAR SODA OR ONE TUBE OF GLUCOSE GEL    STEP 2: RECHECK blood sugar in 15 MINUTES STEP 3: If your blood sugar is still low at the 15 minute recheck --> then, go back to STEP 1 and treat AGAIN with another 15 grams of carbohydrates.

## 2022-08-01 ENCOUNTER — Other Ambulatory Visit: Payer: Self-pay | Admitting: Family Medicine

## 2022-08-01 ENCOUNTER — Encounter: Payer: Self-pay | Admitting: Internal Medicine

## 2022-08-02 ENCOUNTER — Telehealth: Payer: Self-pay

## 2022-08-02 NOTE — Telephone Encounter (Signed)
Patient Advocate Encounter   Received notification from pt msgs that prior authorization is required for Dexcom G6 transmitter  Submitted: 08/02/22 Key [id number]  Status is pending

## 2022-08-02 NOTE — Telephone Encounter (Signed)
Patient need PA on [employer]. Was requested on 07/19/22 but don't think it was sent to them team

## 2022-08-02 NOTE — Telephone Encounter (Signed)
Patient Advocate Encounter   Received notification from pt msgs that prior authorization is required for Dexcom G6 sensor  Submitted: 08/01/22 Key [id number]  Status is pending

## 2022-08-04 NOTE — Telephone Encounter (Signed)
Pharmacy Patient Advocate Encounter  Received notification that the request for prior authorization for Dexcom transmitter has been denied

## 2022-08-04 NOTE — Telephone Encounter (Signed)
Pharmacy Patient Advocate Encounter  Received notification that the request for prior authorization for Dexcom G6 sensor has been denied

## 2022-08-05 ENCOUNTER — Other Ambulatory Visit (HOSPITAL_COMMUNITY): Payer: Self-pay

## 2022-08-07 ENCOUNTER — Other Ambulatory Visit (HOSPITAL_COMMUNITY): Payer: Self-pay

## 2022-08-07 NOTE — Telephone Encounter (Signed)
Dexcom is the insurance's preferred CGM.   Per insurance: Must use Dexcom or submit a medically necessary PA.   They are going to require an "intensive insulin regimen" in the PA form as well

## 2022-08-08 NOTE — Telephone Encounter (Signed)
So they will cover the Dexcom which it what we wrote for or she needs to have intensive insulin regimen for this one as well

## 2022-08-09 NOTE — Patient Instructions (Signed)
Cheryl Middleton  08/09/2022     @PREFPERIOPPHARMACY @   Your procedure is scheduled on  08/15/2022.   [facility] to [facility] at  1015 A.M.   Call this number if you have problems the morning of surgery:  [phone]  If you experience any cold or flu symptoms such as cough, fever, chills, shortness of breath, etc. between now and your scheduled surgery, please notify us at the above number.   Remember:  Follow the diet and prep instructions given to you by the office.      Your last dose of Ozempic should be on 08/07/2022 and your last dose of farxiga should be on 08/11/2022.      DO NOT take any medications for diabetes the morning of your procedure.      Take these medicines the morning of surgery with A SIP OF WATER                                       None.    Do not wear jewelry, make-up or nail polish, including gel polish,  artificial nails, or any other type of covering on natural nails (fingers and  toes).  Do not wear lotions, powders, or perfumes, or deodorant.  Do not shave 48 hours prior to surgery.  Men may shave face and neck.  Do not bring valuables to the hospital.  [facility] is not responsible for any belongings or valuables.  Contacts, dentures or bridgework may not be worn into surgery.  Leave your suitcase in the car.  After surgery it may be brought to your room.  For patients admitted to the hospital, discharge time will be determined by your treatment team.  Patients discharged the day of surgery will not be allowed to drive home and must have someone with them for 24 hours.    Special instructions:   DO NOT smoke tobacco or vape for 24 hours before your procedure.  Please read over the following fact sheets that you were given. Anesthesia Post-op Instructions and Care and Recovery After Surgery      Colonoscopy, Adult, Care After The following information offers guidance on how to care for yourself after your procedure. Your  health care provider may also give you more specific instructions. If you have problems or questions, contact your health care provider. What can I expect after the procedure? After the procedure, it is common to have: A small amount of blood in your stool for 24 hours after the procedure. Some gas. Mild cramping or bloating of your abdomen. Follow these instructions at home: Eating and drinking  Drink enough fluid to keep your urine pale yellow. Follow instructions from your health care provider about eating or drinking restrictions. Resume your normal diet as told by your health care provider. Avoid heavy or fried foods that are hard to digest. Activity Rest as told by your health care provider. Avoid sitting for a long time without moving. Get up to take short walks every 1-2 hours. This is important to improve blood flow and breathing. Ask for help if you feel weak or unsteady. Return to your normal activities as told by your health care provider. Ask your health care provider what activities are safe for you. Managing cramping and bloating  Try walking around when you have cramps or feel bloated. If directed, apply heat to your abdomen  as told by your health care provider. Use the heat source that your health care provider recommends, such as a moist heat pack or a heating pad. Place a towel between your skin and the heat source. Leave the heat on for 20-30 minutes. Remove the heat if your skin turns bright red. This is especially important if you are unable to feel pain, heat, or cold. You have a greater risk of getting burned. General instructions If you were given a sedative during the procedure, it can affect you for several hours. Do not drive or operate machinery until your health care provider says that it is safe. For the first 24 hours after the procedure: Do not sign important documents. Do not drink alcohol. Do your regular daily activities at a slower pace than  normal. Eat soft foods that are easy to digest. Take over-the-counter and prescription medicines only as told by your health care provider. Keep all follow-up visits. This is important. Contact a health care provider if: You have blood in your stool 2-3 days after the procedure. Get help right away if: You have more than a small spotting of blood in your stool. You have large blood clots in your stool. You have swelling of your abdomen. You have nausea or vomiting. You have a fever. You have increasing pain in your abdomen that is not relieved with medicine. These symptoms may be an emergency. Get help right away. Call 911. Do not wait to see if the symptoms will go away. Do not drive yourself to the hospital. Summary After the procedure, it is common to have a small amount of blood in your stool. You may also have mild cramping and bloating of your abdomen. If you were given a sedative during the procedure, it can affect you for several hours. Do not drive or operate machinery until your health care provider says that it is safe. Get help right away if you have a lot of blood in your stool, nausea or vomiting, a fever, or increased pain in your abdomen. This information is not intended to replace advice given to you by your health care provider. Make sure you discuss any questions you have with your health care provider. Document Revised: 10/27/2020 Document Reviewed: 10/27/2020 Elsevier Patient Education  2023 [employer] Monitored Anesthesia Care, Care After The following information offers guidance on how to care for yourself after your procedure. Your health care provider may also give you more specific instructions. If you have problems or questions, contact your health care provider. What can I expect after the procedure? After the procedure, it is common to have: Tiredness. Little or no memory about what happened during or after the procedure. Impaired judgment when it comes  to making decisions. Nausea or vomiting. Some trouble with balance. Follow these instructions at home: For the time period you were told by your health care provider:  Rest. Do not participate in activities where you could fall or become injured. Do not drive or use machinery. Do not drink alcohol. Do not take sleeping pills or medicines that cause drowsiness. Do not make important decisions or sign legal documents. Do not take care of children on your own. Medicines Take over-the-counter and prescription medicines only as told by your health care provider. If you were prescribed antibiotics, take them as told by your health care provider. Do not stop using the antibiotic even if you start to feel better. Eating and drinking Follow instructions from your health care provider about what  you may eat and drink. Drink enough fluid to keep your urine pale yellow. If you vomit: Drink clear fluids slowly and in small amounts as you are able. Clear fluids include water, ice chips, low-calorie sports drinks, and fruit juice that has water added to it (diluted fruit juice). Eat light and bland foods in small amounts as you are able. These foods include bananas, applesauce, rice, lean meats, toast, and crackers. General instructions  Have a responsible adult stay with you for the time you are told. It is important to have someone help care for you until you are awake and alert. If you have sleep apnea, surgery and some medicines can increase your risk for breathing problems. Follow instructions from your health care provider about wearing your sleep device: When you are sleeping. This includes during daytime naps. While taking prescription pain medicines, sleeping medicines, or medicines that make you drowsy. Do not use any products that contain nicotine or tobacco. These products include cigarettes, chewing tobacco, and vaping devices, such as e-cigarettes. If you need help quitting, ask your  health care provider. Contact a health care provider if: You feel nauseous or vomit every time you eat or drink. You feel light-headed. You are still sleepy or having trouble with balance after 24 hours. You get a rash. You have a fever. You have redness or swelling around the IV site. Get help right away if: You have trouble breathing. You have new confusion after you get home. These symptoms may be an emergency. Get help right away. Call 911. Do not wait to see if the symptoms will go away. Do not drive yourself to the hospital. This information is not intended to replace advice given to you by your health care provider. Make sure you discuss any questions you have with your health care provider. Document Revised: 08/01/2021 Document Reviewed: 08/01/2021 Elsevier Patient Education  2023 [employer].

## 2022-08-10 ENCOUNTER — Ambulatory Visit: Payer: BC Managed Care – PPO | Admitting: Nurse Practitioner

## 2022-08-10 VITALS — BP 129/79 | Wt 153.4 lb

## 2022-08-10 DIAGNOSIS — E1159 Type 2 diabetes mellitus with other circulatory complications: Secondary | ICD-10-CM

## 2022-08-10 DIAGNOSIS — Z794 Long term (current) use of insulin: Secondary | ICD-10-CM

## 2022-08-10 DIAGNOSIS — Z8673 Personal history of transient ischemic attack (TIA), and cerebral infarction without residual deficits: Secondary | ICD-10-CM | POA: Diagnosis not present

## 2022-08-10 DIAGNOSIS — Z23 Encounter for immunization: Secondary | ICD-10-CM | POA: Diagnosis not present

## 2022-08-10 DIAGNOSIS — S61214A Laceration without foreign body of right ring finger without damage to nail, initial encounter: Secondary | ICD-10-CM | POA: Diagnosis not present

## 2022-08-10 NOTE — Progress Notes (Signed)
   Subjective:    Patient ID: Cheryl Middleton, female    DOB: 1965-07-03, 57 y.o.   MRN: [id number]  HPI Patient arrives needing forms filled out for the [facility] Patient continues to do well with her blood sugars.  Monitoring them carefully.  Avoiding hypoglycemic reactions.  Regular meals and snacks.  Was wearing Dexcom 6 at 1 point but this is no longer covered by her insurance. Has been working hard on lifestyle changes. Has no residual effects from the past ischemic stroke.  Gets regular eye exams most recent was about 2 to 3 weeks ago.  States this was normal.  Review of Systems  Respiratory:  Negative for cough, chest tightness and shortness of breath.   Cardiovascular:  Negative for chest pain, palpitations and leg swelling.  Neurological:  Negative for dizziness, facial asymmetry, speech difficulty, weakness and headaches.       Objective:   Physical Exam NAD.  Alert, oriented.  Calm cheerful affect.  Making good eye contact.  Speech clear.  No facial asymmetry.  Lungs clear.  Heart regular rate rhythm.  Gait normal limit. Superficial small open healing laceration noted on the right ring finger.  No evidence of infection.      Assessment & Plan:   Problem List Items Addressed This Visit       Endocrine   Diabetes mellitus (HCC)   Other Visit Diagnoses     History of ischemic stroke    -  Primary   Laceration of right ring finger without foreign body without damage to nail, initial encounter       Relevant Orders   Tdap vaccine greater than or equal to 7yo IM (Completed)      Continue current medications as directed. Patient has colonoscopy scheduled for 08/15/2022. Continue close monitoring of blood sugars.  Continue healthy lifestyle changes. No residual effects from previous ischemic stroke.  Patient cleared for driving, will complete DMV forms. Tdap today due to wound. Recommend patient schedule a preventive health physical including Pap smear. Otherwise routine  follow-up.

## 2022-08-11 ENCOUNTER — Encounter (HOSPITAL_COMMUNITY)
Admission: RE | Admit: 2022-08-11 | Discharge: 2022-08-11 | Disposition: A | Discharge status: Home / Self Care | Payer: BC Managed Care – PPO | Source: Ambulatory Visit | Attending: Internal Medicine | Admitting: Internal Medicine

## 2022-08-11 ENCOUNTER — Encounter (HOSPITAL_COMMUNITY): Payer: Self-pay

## 2022-08-11 ENCOUNTER — Encounter: Payer: Self-pay | Admitting: Nurse Practitioner

## 2022-08-11 VITALS — BP 119/58 | HR 105 | Temp 97.5°F | Resp 18 | Ht 61.5 in | Wt 153.4 lb

## 2022-08-11 DIAGNOSIS — E1159 Type 2 diabetes mellitus with other circulatory complications: Secondary | ICD-10-CM | POA: Diagnosis present

## 2022-08-11 DIAGNOSIS — Z01818 Encounter for other preprocedural examination: Secondary | ICD-10-CM | POA: Diagnosis not present

## 2022-08-11 DIAGNOSIS — Z794 Long term (current) use of insulin: Secondary | ICD-10-CM | POA: Diagnosis not present

## 2022-08-11 DIAGNOSIS — I10 Essential (primary) hypertension: Secondary | ICD-10-CM

## 2022-08-11 HISTORY — DX: Cerebral infarction, unspecified: I63.9

## 2022-08-11 HISTORY — DX: Essential (primary) hypertension: I10

## 2022-08-11 LAB — BASIC METABOLIC PANEL
Anion gap: 9 (ref 5–15)
BUN: 12 mg/dL (ref 6–20)
CO2: 26 mmol/L (ref 22–32)
Calcium: 8.9 mg/dL (ref 8.9–10.3)
Chloride: 102 mmol/L (ref 98–111)
Creatinine, Ser: 0.69 mg/dL (ref 0.44–1.00)
GFR, Estimated: >60 mL/min (ref >60)
Glucose, Bld: 137 mg/dL — ABNORMAL HIGH (ref 70–99)
Potassium: 3.8 mmol/L (ref 3.5–5.1)
Sodium: 137 mmol/L (ref 135–145)

## 2022-08-15 ENCOUNTER — Ambulatory Visit (HOSPITAL_COMMUNITY)
Admission: RE | Admit: 2022-08-15 | Discharge: 2022-08-15 | Disposition: A | Discharge status: Home / Self Care | Payer: BC Managed Care – PPO | Attending: Internal Medicine | Admitting: Internal Medicine

## 2022-08-15 ENCOUNTER — Encounter (HOSPITAL_COMMUNITY)
Admission: RE | Disposition: A | Discharge status: Home / Self Care | Payer: Self-pay | Source: Home / Self Care | Attending: Internal Medicine

## 2022-08-15 ENCOUNTER — Encounter (HOSPITAL_COMMUNITY): Payer: Self-pay

## 2022-08-15 ENCOUNTER — Ambulatory Visit (HOSPITAL_COMMUNITY): Payer: BC Managed Care – PPO | Admitting: Anesthesiology

## 2022-08-15 DIAGNOSIS — Z8249 Family history of ischemic heart disease and other diseases of the circulatory system: Secondary | ICD-10-CM | POA: Insufficient documentation

## 2022-08-15 DIAGNOSIS — Z1211 Encounter for screening for malignant neoplasm of colon: Secondary | ICD-10-CM | POA: Insufficient documentation

## 2022-08-15 DIAGNOSIS — Z8673 Personal history of transient ischemic attack (TIA), and cerebral infarction without residual deficits: Secondary | ICD-10-CM | POA: Diagnosis not present

## 2022-08-15 DIAGNOSIS — K5730 Diverticulosis of large intestine without perforation or abscess without bleeding: Secondary | ICD-10-CM | POA: Diagnosis not present

## 2022-08-15 DIAGNOSIS — K5900 Constipation, unspecified: Secondary | ICD-10-CM | POA: Diagnosis not present

## 2022-08-15 DIAGNOSIS — Z7984 Long term (current) use of oral hypoglycemic drugs: Secondary | ICD-10-CM | POA: Diagnosis not present

## 2022-08-15 DIAGNOSIS — E1165 Type 2 diabetes mellitus with hyperglycemia: Secondary | ICD-10-CM

## 2022-08-15 DIAGNOSIS — Z85810 Personal history of malignant neoplasm of tongue: Secondary | ICD-10-CM | POA: Diagnosis not present

## 2022-08-15 DIAGNOSIS — E119 Type 2 diabetes mellitus without complications: Secondary | ICD-10-CM | POA: Diagnosis not present

## 2022-08-15 DIAGNOSIS — Z794 Long term (current) use of insulin: Secondary | ICD-10-CM | POA: Diagnosis not present

## 2022-08-15 DIAGNOSIS — I10 Essential (primary) hypertension: Secondary | ICD-10-CM | POA: Diagnosis not present

## 2022-08-15 DIAGNOSIS — E785 Hyperlipidemia, unspecified: Secondary | ICD-10-CM | POA: Diagnosis not present

## 2022-08-15 DIAGNOSIS — Z86010 Personal history of colonic polyps: Secondary | ICD-10-CM | POA: Diagnosis not present

## 2022-08-15 DIAGNOSIS — Z833 Family history of diabetes mellitus: Secondary | ICD-10-CM | POA: Diagnosis not present

## 2022-08-15 HISTORY — PX: COLONOSCOPY WITH PROPOFOL: SHX5780

## 2022-08-15 LAB — GLUCOSE, CAPILLARY: Glucose-Capillary: 72 mg/dL (ref 70–99)

## 2022-08-15 SURGERY — COLONOSCOPY WITH PROPOFOL
Anesthesia: General

## 2022-08-15 MED ORDER — PHENYLEPHRINE 80 MCG/ML (10ML) SYRINGE FOR IV PUSH (FOR BLOOD PRESSURE SUPPORT)
PREFILLED_SYRINGE | INTRAVENOUS | Status: DC | PRN
  Administered 2022-08-15: 160 ug via INTRAVENOUS

## 2022-08-15 MED ORDER — LIDOCAINE HCL (CARDIAC) PF 100 MG/5ML IV SOSY
PREFILLED_SYRINGE | INTRAVENOUS | Status: DC | PRN
  Administered 2022-08-15: 50 mg via INTRAVENOUS

## 2022-08-15 MED ORDER — LACTATED RINGERS IV SOLN: INTRAVENOUS | Status: DC

## 2022-08-15 MED ORDER — STERILE WATER FOR IRRIGATION IR SOLN
Status: DC | PRN
  Administered 2022-08-15: 60 mL

## 2022-08-15 MED ORDER — PROPOFOL 10 MG/ML IV BOLUS
INTRAVENOUS | Status: DC | PRN
  Administered 2022-08-15: 100 mg via INTRAVENOUS
  Administered 2022-08-15 (×3): 50 mg via INTRAVENOUS

## 2022-08-15 MED ORDER — PHENYLEPHRINE 80 MCG/ML (10ML) SYRINGE FOR IV PUSH (FOR BLOOD PRESSURE SUPPORT)
PREFILLED_SYRINGE | INTRAVENOUS | Status: AC
  Filled 2022-08-15: qty 10

## 2022-08-15 NOTE — Anesthesia Procedure Notes (Signed)
Date/Time: 08/15/2022 11:31 AM  Performed by: Julian Reil, CRNAPre-anesthesia Checklist: Patient identified, Emergency Drugs available, Suction available and Patient being monitored Patient Re-evaluated:Patient Re-evaluated prior to induction Oxygen Delivery Method: Nasal cannula Induction Type: IV induction Placement Confirmation: positive ETCO2

## 2022-08-15 NOTE — Transfer of Care (Signed)
Immediate Anesthesia Transfer of Care Note  Patient: Cheryl Middleton  Procedure(s) Performed: COLONOSCOPY WITH PROPOFOL  Patient Location: Short Stay  Anesthesia Type:General  Level of Consciousness: drowsy  Airway & Oxygen Therapy: Patient Spontanous Breathing  Post-op Assessment: Report given to RN and Post -op Vital signs reviewed and stable  Post vital signs: Reviewed and stable  Last Vitals:  Vitals Value Taken Time  BP    Temp    Pulse    Resp    SpO2      Last Pain:  Vitals:   08/15/22 1129  TempSrc:   PainSc: 0-No pain      Patients Stated Pain Goal: 5 (08/15/22 1016)  Complications: No notable events documented.

## 2022-08-15 NOTE — Interval H&P Note (Signed)
History and Physical Interval Note:  08/15/2022 11:23 AM  Cheryl Middleton  has presented today for surgery, with the diagnosis of hx polyps.  The various methods of treatment have been discussed with the patient and family. After consideration of risks, benefits and other options for treatment, the patient has consented to  Procedure(s) with comments: COLONOSCOPY WITH PROPOFOL (N/A) - 12:15pm, asa 3 as a surgical intervention.  The patient's history has been reviewed, patient examined, no change in status, stable for surgery.  I have reviewed the patient's chart and labs.  Questions were answered to the patient's satisfaction.     Lanelle Bal

## 2022-08-15 NOTE — Discharge Instructions (Addendum)
  Colonoscopy Discharge Instructions  Read the instructions outlined below and refer to this sheet in the next few weeks. These discharge instructions provide you with general information on caring for yourself after you leave the hospital. Your doctor may also give you specific instructions. While your treatment has been planned according to the most current medical practices available, unavoidable complications occasionally occur.   ACTIVITY You may resume your regular activity, but move at a slower pace for the next 24 hours.  Take frequent rest periods for the next 24 hours.  Walking will help get rid of the air and reduce the bloated feeling in your belly (abdomen).  No driving for 24 hours (because of the medicine (anesthesia) used during the test).   Do not sign any important legal documents or operate any machinery for 24 hours (because of the anesthesia used during the test).  NUTRITION Drink plenty of fluids.  You may resume your normal diet as instructed by your doctor.  Begin with a light meal and progress to your normal diet. Heavy or fried foods are harder to digest and may make you feel sick to your stomach (nauseated).  Avoid alcoholic beverages for 24 hours or as instructed.  MEDICATIONS You may resume your normal medications unless your doctor tells you otherwise.  WHAT YOU CAN EXPECT TODAY Some feelings of bloating in the abdomen.  Passage of more gas than usual.  Spotting of blood in your stool or on the toilet paper.  IF YOU HAD POLYPS REMOVED DURING THE COLONOSCOPY: No aspirin products for 7 days or as instructed.  No alcohol for 7 days or as instructed.  Eat a soft diet for the next 24 hours.  FINDING OUT THE RESULTS OF YOUR TEST Not all test results are available during your visit. If your test results are not back during the visit, make an appointment with your caregiver to find out the results. Do not assume everything is normal if you have not heard from your  caregiver or the medical facility. It is important for you to follow up on all of your test results.  SEEK IMMEDIATE MEDICAL ATTENTION IF: You have more than a spotting of blood in your stool.  Your belly is swollen (abdominal distention).  You are nauseated or vomiting.  You have a temperature over 101.  You have abdominal pain or discomfort that is severe or gets worse throughout the day.   Your colonoscopy was relatively unremarkable.  I did not find any polyps or evidence of colon cancer.  I recommend repeating colonoscopy in 5 given history of polyps prior.  You do have diverticulosis. I would recommend increasing fiber in your diet or adding OTC Benefiber/Metamucil. Be sure to drink at least 4 to 6 glasses of water daily. Follow-up with GI as needed.   I hope you have a great rest of your week!  Hennie Duos. Marletta Lor, D.O. Gastroenterology and Hepatology [facility]

## 2022-08-15 NOTE — Anesthesia Preprocedure Evaluation (Signed)
Anesthesia Evaluation  Patient identified by MRN, date of birth, ID band Patient awake    Reviewed: Allergy & Precautions, H&P , NPO status , Patient's Chart, lab work & pertinent test results, reviewed documented beta blocker date and time   Airway Mallampati: II  TM Distance: >3 FB Neck ROM: full    Dental no notable dental hx.    Pulmonary neg pulmonary ROS   Pulmonary exam normal breath sounds clear to auscultation       Cardiovascular Exercise Tolerance: Good hypertension, negative cardio ROS  Rhythm:regular Rate:Normal     Neuro/Psych CVA negative neurological ROS  negative psych ROS   GI/Hepatic negative GI ROS, Neg liver ROS,,,  Endo/Other  negative endocrine ROSdiabetes    Renal/GU negative Renal ROS  negative genitourinary   Musculoskeletal   Abdominal   Peds  Hematology negative hematology ROS (+)   Anesthesia Other Findings   Reproductive/Obstetrics negative OB ROS                             Anesthesia Physical Anesthesia Plan  ASA: 3  Anesthesia Plan: General   Post-op Pain Management:    Induction:   PONV Risk Score and Plan: Propofol infusion  Airway Management Planned:   Additional Equipment:   Intra-op Plan:   Post-operative Plan:   Informed Consent: I have reviewed the patients History and Physical, chart, labs and discussed the procedure including the risks, benefits and alternatives for the proposed anesthesia with the patient or authorized representative who has indicated his/her understanding and acceptance.     Dental Advisory Given  Plan Discussed with: CRNA  Anesthesia Plan Comments:        Anesthesia Quick Evaluation

## 2022-08-15 NOTE — Op Note (Signed)
[facility] Patient Name: Cheryl Middleton Procedure Date: 08/15/2022 11:14 AM MRN: [id number] Date of Birth: 07-07-1965 Attending MD: Hennie Duos. Marletta Lor , [place], [id number] CSN: [id number] Age: 57 Admit Type: Outpatient Procedure:                Colonoscopy Indications:              Surveillance: Personal history of adenomatous                            polyps on last colonoscopy 5 years ago Providers:                Hennie Duos. Marletta Lor, DO, Angelica Ran, Crystal Page Referring MD:              Medicines:                See the Anesthesia note for documentation of the                            administered medications Complications:            No immediate complications. Estimated Blood Loss:     Estimated blood loss: none. Procedure:                Pre-Anesthesia Assessment:                           - The anesthesia plan was to use monitored                            anesthesia care (MAC).                           After obtaining informed consent, the colonoscope                            was passed under direct vision. Throughout the                            procedure, the patient's blood pressure, pulse, and                            oxygen saturations were monitored continuously. The                            [id number]-HQ190L ([id number]) scope was introduced through                            the anus and advanced to the the cecum, identified                            by appendiceal orifice and ileocecal valve. The                            colonoscopy was performed without difficulty. The                            patient  tolerated the procedure well. The quality                            of the bowel preparation was evaluated using the                            BBPS [facility] Bowel Preparation Scale) with scores                            of: Right Colon = 2 (minor amount of residual                            staining, small fragments of stool and/or opaque                             liquid, but mucosa seen well), Transverse Colon = 3                            (entire mucosa seen well with no residual staining,                            small fragments of stool or opaque liquid) and Left                            Colon = 3 (entire mucosa seen well with no residual                            staining, small fragments of stool or opaque                            liquid). The total BBPS score equals 8. The quality                            of the bowel preparation was good. Scope In: 11:32:33 AM Scope Out: 11:45:50 AM Scope Withdrawal Time: 0 hours 9 minutes 0 seconds  Total Procedure Duration: 0 hours 13 minutes 17 seconds  Findings:      A few medium-mouthed and small-mouthed diverticula were found in the       ascending colon.      The exam was otherwise without abnormality. Impression:               - Diverticulosis in the ascending colon.                           - The examination was otherwise normal.                           - No specimens collected. Moderate Sedation:      Per Anesthesia Care Recommendation:           - Patient has a contact number available for                            emergencies. The signs and symptoms  of potential                            delayed complications were discussed with the                            patient. Return to normal activities tomorrow.                            Written discharge instructions were provided to the                            patient.                           - Resume previous diet.                           - Continue present medications.                           - Repeat colonoscopy in 5 years for surveillance.                           - Return to GI clinic PRN. Procedure Code(s):        --- Professional ---                           Z6109, Colorectal cancer screening; colonoscopy on                            individual at high risk Diagnosis Code(s):        --- Professional  ---                           Z86.010, Personal history of colonic polyps                           K57.30, Diverticulosis of large intestine without                            perforation or abscess without bleeding CPT copyright 2022 American Medical Association. All rights reserved. The codes documented in this report are preliminary and upon coder review may  be revised to meet current compliance requirements. Hennie Duos. Marletta Lor, DO Hennie Duos. Marletta Lor, DO 08/15/2022 11:48:11 AM This report has been signed electronically. Number of Addenda: 0

## 2022-08-16 ENCOUNTER — Ambulatory Visit (INDEPENDENT_AMBULATORY_CARE_PROVIDER_SITE_OTHER): Payer: BC Managed Care – PPO | Admitting: Family Medicine

## 2022-08-16 VITALS — BP 128/79 | HR 89 | Ht 61.5 in | Wt 151.6 lb

## 2022-08-16 DIAGNOSIS — R634 Abnormal weight loss: Secondary | ICD-10-CM | POA: Diagnosis not present

## 2022-08-16 DIAGNOSIS — E1159 Type 2 diabetes mellitus with other circulatory complications: Secondary | ICD-10-CM

## 2022-08-16 DIAGNOSIS — E1169 Type 2 diabetes mellitus with other specified complication: Secondary | ICD-10-CM

## 2022-08-16 DIAGNOSIS — E785 Hyperlipidemia, unspecified: Secondary | ICD-10-CM

## 2022-08-16 DIAGNOSIS — I10 Essential (primary) hypertension: Secondary | ICD-10-CM

## 2022-08-16 DIAGNOSIS — Z794 Long term (current) use of insulin: Secondary | ICD-10-CM

## 2022-08-16 MED ORDER — LOSARTAN POTASSIUM 25 MG PO TABS: 25.0000 mg | ORAL_TABLET | Freq: Every day | ORAL | 2 refills | Status: DC

## 2022-08-16 MED ORDER — ROSUVASTATIN CALCIUM 20 MG PO TABS: 20.0000 mg | ORAL_TABLET | Freq: Every day | ORAL | 2 refills | Status: DC

## 2022-08-16 NOTE — Progress Notes (Signed)
Subjective:    Patient ID: Cheryl Middleton, female    DOB: 19-Aug-1965, 57 y.o.   MRN: [id number]  HPI Patient arrives today for 6 month follow. Patient states no concerns or issues today. Type 2 diabetes mellitus with other circulatory complication, with long-term current use of insulin (HCC) - Plan: Hemoglobin A1c, Basic Metabolic Panel, Microalbumin/Creatinine Ratio, Urine  Essential hypertension, benign - Plan: Hepatic function panel  Hyperlipidemia associated with type 2 diabetes mellitus (HCC) - Plan: Lipid panel  Weight loss Previous labs reviewed Recent labs reviewed Current medications reviewed Patient is trying to watch diet and stay active She is trying to be healthy with her choices Denies any major setbacks She is losing significant amount of weight with the Ozempic  Outpatient Encounter Medications as of 08/16/2022  Medication Sig   aspirin 81 MG chewable tablet Chew 81 mg by mouth in the morning.   dapagliflozin propanediol (FARXIGA) 10 MG TABS tablet Take 1 tablet (10 mg total) by mouth daily before breakfast.   Glucagon, rDNA, (GLUCAGON EMERGENCY) 1 MG KIT Inject if severe low glucose   hydrocortisone 2.5 % cream Apply topically 2 (two) times daily. Apply twice daily for flare ups above neck, maximum 7 days. (Patient taking differently: Apply 1 Application topically 2 (two) times daily as needed (skin irritation .). Apply twice daily for flare ups above neck, maximum 7 days.)   Insulin Degludec (TRESIBA) 100 UNIT/ML SOLN Inject 22 Units into the skin daily in the afternoon. Keep as flextouch (Patient taking differently: Inject 22 Units into the skin in the morning. Inject 22 Units into the skin daily in the afternoon. Keep as flextouch)   metFORMIN (GLUCOPHAGE-XR) 750 MG 24 hr tablet Take 1 tablet (750 mg total) by mouth daily with breakfast.   Semaglutide, 2 MG/DOSE, 8 MG/3ML SOPN Inject 2 mg as directed once a week. (Patient taking differently: Inject 2 mg as  directed every Sunday.)   triamcinolone ointment (KENALOG) 0.1 % Apply 1 Application topically 2 (two) times daily as needed (skin irritation.).   [DISCONTINUED] losartan (COZAAR) 25 MG tablet TAKE 1 TABLET (25 MG TOTAL) BY MOUTH DAILY.   [DISCONTINUED] rosuvastatin (CRESTOR) 20 MG tablet Take 1 tablet (20 mg total) by mouth daily.   losartan (COZAAR) 25 MG tablet Take 1 tablet (25 mg total) by mouth daily.   rosuvastatin (CRESTOR) 20 MG tablet Take 1 tablet (20 mg total) by mouth daily.   No facility-administered encounter medications on file as of 08/16/2022.      Review of Systems     Objective:   Physical Exam General-in no acute distress Eyes-no discharge Lungs-respiratory rate normal, CTA CV-no murmurs,RRR Extremities skin warm dry no edema Neuro grossly normal Behavior normal, alert    Refills of losartan cholesterol medicine were sent and    Assessment & Plan:  She sees endocrinology for her diabetic care  1. Type 2 diabetes mellitus with other circulatory complication, with long-term current use of insulin (HCC) A1c overall looks good she will do her lab work later this fall before her visit with endocrinology Patient is aware to watch her numbers if she has low sugar spells she needs to reduce her insulin She is getting good weight loss with Ozempic as well as A1c control I did tell the patient that I did not want her to get below 135-132 pounds - Hemoglobin A1c - Basic Metabolic Panel - Microalbumin/Creatinine Ratio, Urine  2. Essential hypertension, benign Blood pressure currently good but if it does  start dropping she will need to let us know may have to readjust medicine as she loses weight - Hepatic function panel  3. Hyperlipidemia associated with type 2 diabetes mellitus (HCC) Continue statin LDL below 70 healthy diet previous labs reviewed - Lipid panel  4. Weight loss Weight is coming down this is a byproduct of her being on Ozempic and healthy  eating and regular physical activity if she has continued to lose dramatic amount of weight may need to readjust Ozempic this would be up to endocrinology patient will keep [place] posted patient will follow-up by January she follows up in the fall with endocrinology  Patient was warned that if she gets gastrointestinal virus to hold off losartan metformin Farxiga and Ozempic while sick

## 2022-08-21 NOTE — Anesthesia Postprocedure Evaluation (Signed)
Anesthesia Post Note  Patient: Cheryl Middleton  Procedure(s) Performed: COLONOSCOPY WITH PROPOFOL  Patient location during evaluation: Phase II Anesthesia Type: General Level of consciousness: awake Pain management: pain level controlled Vital Signs Assessment: post-procedure vital signs reviewed and stable Respiratory status: spontaneous breathing and respiratory function stable Cardiovascular status: blood pressure returned to baseline and stable Postop Assessment: no headache and no apparent nausea or vomiting Anesthetic complications: no Comments: Late entry   No notable events documented.   Last Vitals:  Vitals:   08/15/22 1016 08/15/22 1151  BP: 136/71 (!) 105/49  Pulse: 98 91  Resp: 18 18  Temp: 36.7 C 36.7 C  SpO2: 99% 97%    Last Pain:  Vitals:   08/15/22 1151  TempSrc:   PainSc: 0-No pain                 Windell Norfolk

## 2022-08-22 ENCOUNTER — Encounter (HOSPITAL_COMMUNITY): Payer: Self-pay | Admitting: Internal Medicine

## 2022-08-27 ENCOUNTER — Encounter: Payer: Self-pay | Admitting: Internal Medicine

## 2022-08-28 ENCOUNTER — Other Ambulatory Visit: Payer: Self-pay

## 2022-08-28 MED ORDER — ONETOUCH VERIO VI STRP: ORAL_STRIP | 12 refills | Status: AC

## 2022-09-07 ENCOUNTER — Encounter: Payer: Self-pay | Admitting: Family Medicine

## 2022-09-08 ENCOUNTER — Telehealth: Payer: Self-pay | Admitting: Nurse Practitioner

## 2022-09-08 NOTE — Telephone Encounter (Signed)
Form completed and sent to front desk. 

## 2022-09-08 NOTE — Telephone Encounter (Signed)
Patient states Department of transportation sent over updated form to be completed and sent back. Put on your desk for review and completion .

## 2022-09-20 ENCOUNTER — Other Ambulatory Visit: Payer: Self-pay | Admitting: Family Medicine

## 2022-09-22 NOTE — Telephone Encounter (Signed)
Please verify with patient whether or not they are taking these before we do any refills If she says she is taking these she may have 6 months

## 2022-10-06 LAB — HM DIABETES EYE EXAM

## 2022-10-17 ENCOUNTER — Ambulatory Visit: Payer: BC Managed Care – PPO | Admitting: Dermatology

## 2022-11-06 ENCOUNTER — Encounter: Payer: Self-pay | Admitting: *Deleted

## 2022-12-22 ENCOUNTER — Other Ambulatory Visit: Payer: Self-pay | Admitting: Internal Medicine

## 2023-01-03 LAB — BASIC METABOLIC PANEL
BUN/Creatinine Ratio: 15 (ref 9–23)
BUN: 12 mg/dL (ref 6–24)
CO2: 23 mmol/L (ref 20–29)
Calcium: 9.6 mg/dL (ref 8.7–10.2)
Chloride: 106 mmol/L (ref 96–106)
Creatinine, Ser: 0.82 mg/dL (ref 0.57–1.00)
Glucose: 80 mg/dL (ref 70–99)
Potassium: 4.8 mmol/L (ref 3.5–5.2)
Sodium: 145 mmol/L — ABNORMAL HIGH (ref 134–144)
eGFR: 84 mL/min/{1.73_m2} (ref >59)

## 2023-01-03 LAB — LIPID PANEL
Chol/HDL Ratio: 2.3 {ratio} (ref 0.0–4.4)
Cholesterol, Total: 144 mg/dL (ref 100–199)
HDL: 62 mg/dL (ref >39)
LDL Chol Calc (NIH): 65 mg/dL (ref 0–99)
Triglycerides: 92 mg/dL (ref 0–149)
VLDL Cholesterol Cal: 17 mg/dL (ref 5–40)

## 2023-01-03 LAB — HEPATIC FUNCTION PANEL
ALT: 10 [IU]/L (ref 0–32)
AST: 19 [IU]/L (ref 0–40)
Albumin: 4.7 g/dL (ref 3.8–4.9)
Alkaline Phosphatase: 101 [IU]/L (ref 44–121)
Bilirubin Total: 0.5 mg/dL (ref 0.0–1.2)
Bilirubin, Direct: 0.15 mg/dL (ref 0.00–0.40)
Total Protein: 7.7 g/dL (ref 6.0–8.5)

## 2023-01-03 LAB — MICROALBUMIN / CREATININE URINE RATIO
Creatinine, Urine: 91.9 mg/dL
Microalb/Creat Ratio: 29 mg/g{creat} (ref 0–29)
Microalbumin, Urine: 26.3 ug/mL

## 2023-01-03 LAB — HEMOGLOBIN A1C
Est. average glucose Bld gHb Est-mCnc: 160 mg/dL
Hgb A1c MFr Bld: 7.2 % — ABNORMAL HIGH (ref 4.8–5.6)

## 2023-01-23 ENCOUNTER — Ambulatory Visit: Payer: BC Managed Care – PPO | Admitting: Internal Medicine

## 2023-01-23 ENCOUNTER — Encounter: Payer: Self-pay | Admitting: Internal Medicine

## 2023-01-23 VITALS — BP 124/82 | HR 99 | Ht 61.5 in | Wt 144.2 lb

## 2023-01-23 DIAGNOSIS — E1165 Type 2 diabetes mellitus with hyperglycemia: Secondary | ICD-10-CM | POA: Diagnosis not present

## 2023-01-23 DIAGNOSIS — Z794 Long term (current) use of insulin: Secondary | ICD-10-CM

## 2023-01-23 DIAGNOSIS — E1159 Type 2 diabetes mellitus with other circulatory complications: Secondary | ICD-10-CM

## 2023-01-23 NOTE — Patient Instructions (Signed)
Continue Metformin 750 mg XR, 1 tablet daily  Continue Farxiga 10 mg daily  Continue Ozempic 2 mg ONCE weekly  Decrease Tresiba 18 units once daily      HOW TO TREAT LOW BLOOD SUGARS (Blood sugar LESS THAN 70 MG/DL) Please follow the RULE OF 15 for the treatment of hypoglycemia treatment (when your (blood sugars are less than 70 mg/dL)   STEP 1: Take 15 grams of carbohydrates when your blood sugar is low, which includes:  3-4 GLUCOSE TABS  OR 3-4 OZ OF JUICE OR REGULAR SODA OR ONE TUBE OF GLUCOSE GEL    STEP 2: RECHECK blood sugar in 15 MINUTES STEP 3: If your blood sugar is still low at the 15 minute recheck --> then, go back to STEP 1 and treat AGAIN with another 15 grams of carbohydrates.

## 2023-01-23 NOTE — Progress Notes (Signed)
Name: Cheryl Middleton  Age/ Sex: 57 y.o., female   MRN/ DOB: [id number], 01/17/1966     PCP: Babs Sciara, MD   Reason for Endocrinology Evaluation: Type 2 Diabetes Mellitus  Initial Endocrine Consultative Visit: 06/17/2020    PATIENT IDENTIFIER: Cheryl Middleton is a 57 y.o. female with a past medical history of DM, HTN , CVA . The patient has followed with Endocrinology clinic since 06/17/2020 for consultative assistance with management of her diabetes.  DIABETIC HISTORY:  Cheryl Middleton was diagnosed with DM 1987. Started insulin therapy  2021. Her hemoglobin A1c has ranged from 8.0% in 2022, peaking at 13.0% in 2019.  Patient has followed by Dr. Everardo All from 2022 until March 2023   SUBJECTIVE:   During the last visit (07/19/2022): A1c 7.2%     Today (01/23/2023): Cheryl Middleton is here for a follow up on diabetes management.  She checks her blood sugars 1x daily . The patient has  had hypoglycemic episodes since the last clinic visit, which typically occur in the morning    Insurance does not cover Dexcom unless the patient is on 3 insulin injections a day  She continues with weight loss  Denies nausea vomiting Denies diarrhea but has occasional constipation    HOME DIABETES REGIMEN:  Metformin 750 mg  daily  Farxiga 10 mg daily  Ozempic 2 mg weekly  Tresiba  20 units    Statin: yes ACE-I/ARB: yes   GLUCOSE METER: unable to download   68-130 mg/dL   DIABETIC COMPLICATIONS: Microvascular complications:   Denies: CKD  Last Eye Exam: Completed 03/2021  Macrovascular complications:  CVA Denies: CAD, PVD   HISTORY:  Past Medical History:  Past Medical History:  Diagnosis Date   Cancer (HCC)    tongue   Diabetes mellitus, type II (HCC)    Hyperlipidemia    Hypertension    Malignant neoplasm of tongue (HCC)    Tongue   Stroke [facility])    Past Surgical History:  Past Surgical History:  Procedure Laterality Date   BREAST EXCISIONAL BIOPSY  12/1989    CESAREAN SECTION  01/2003   COLONOSCOPY N/A 04/20/2017   Procedure: COLONOSCOPY;  Surgeon: West Bali, MD;  Location: [facility];  Service: Endoscopy;  Laterality: N/A;  12:00   COLONOSCOPY WITH PROPOFOL N/A 08/15/2022   Procedure: COLONOSCOPY WITH PROPOFOL;  Surgeon: Lanelle Bal, DO;  Location: [facility];  Service: Endoscopy;  Laterality: N/A;  12:15pm, asa 3   TONGUE SURGERY  10/19/1999   tongue cancer   TRACHEOSTOMY     Social History:  reports that she has never smoked. She has never used smokeless tobacco. She reports that she does not drink alcohol and does not use drugs. Family History:  Family History  Problem Relation Age of Onset   Hypertension Mother    Diabetes Mother    Kidney disease Mother    Hypertension Father    Diabetes Father    Congestive Heart Failure Father    COPD Father    Hypertension Sister    Diabetes Sister    Diabetes Sister        borderline   ADD / ADHD Son    Eczema Son      HOME MEDICATIONS: Allergies as of 01/23/2023       Reactions   Ciprofloxacin Anaphylaxis   Sulfonamide Derivatives Shortness Of Breath   Ibuprofen Rash   With higher doses   Penicillins Rash   Has patient  had a PCN reaction causing immediate rash, facial/tongue/throat swelling, SOB or lightheadedness with hypotension: Yes Has patient had a PCN reaction causing severe rash involving mucus membranes or skin necrosis: no Has patient had a PCN reaction that required hospitalization: yes Has patient had a PCN reaction occurring within the last 10 years: yes If all of the above answers are "NO", then may proceed with Cephalosporin use.        Medication List        Accurate as of January 23, 2023  8:19 AM. If you have any questions, ask your nurse or doctor.          STOP taking these medications    famotidine 20 MG tablet Commonly known as: PEPCID Stopped by: Johnney Ou Lorina Duffner       TAKE these medications    aspirin 81 MG  chewable tablet Chew 81 mg by mouth in the morning.   cetirizine 10 MG tablet Commonly known as: ZYRTEC TAKE 1 TABLET BY MOUTH EVERY DAY   dapagliflozin propanediol 10 MG Tabs tablet Commonly known as: Farxiga Take 1 tablet (10 mg total) by mouth daily before breakfast.   Glucagon Emergency 1 MG Kit Inject if severe low glucose   hydrocortisone 2.5 % cream Apply topically 2 (two) times daily. Apply twice daily for flare ups above neck, maximum 7 days. What changed:  how much to take when to take this reasons to take this   Insulin Degludec 100 UNIT/ML Soln Commonly known as: [place] Inject 22 Units into the skin daily in the afternoon. Keep as flextouch What changed:  how much to take how to take this when to take this Another medication with the same name was removed. Continue taking this medication, and follow the directions you see here.   losartan 25 MG tablet Commonly known as: COZAAR Take 1 tablet (25 mg total) by mouth daily.   metFORMIN 750 MG 24 hr tablet Commonly known as: GLUCOPHAGE-XR Take 1 tablet (750 mg total) by mouth daily with breakfast.   OneTouch Verio test strip Generic drug: glucose blood Check blood sugar 2 times daily   rosuvastatin 20 MG tablet Commonly known as: CRESTOR Take 1 tablet (20 mg total) by mouth daily.   Semaglutide (2 MG/DOSE) 8 MG/3ML Sopn Inject 2 mg as directed once a week. What changed: when to take this   triamcinolone ointment 0.1 % Commonly known as: KENALOG Apply 1 Application topically 2 (two) times daily as needed (skin irritation.).         OBJECTIVE:   Vital Signs: Ht 5' 1.5" (1.562 m)   Wt 144 lb 3.2 oz (65.4 kg)   LMP 07/22/2016   BMI 26.81 kg/m   Wt Readings from Last 3 Encounters:  01/23/23 144 lb 3.2 oz (65.4 kg)  08/16/22 151 lb 9.6 oz (68.8 kg)  08/11/22 153 lb 6.4 oz (69.6 kg)     Exam: General: Pt appears well and is in NAD  Lungs: Clear with good BS bilat   Heart: RRR   Abdomen:   soft, nontender  Extremities: No pretibial edema.   Neuro: MS is good with appropriate affect, pt is alert and Ox3    DM foot exam: 01/23/2023  The skin of the feet is intact without sores or ulcerations. The pedal pulses are 2+ on right and 2+ on left. The sensation is intact  to a screening 5.07, 10 gram monofilament bilaterally        DATA REVIEWED:  Lab Results  Component Value Date   HGBA1C 7.2 (H) 01/02/2023   HGBA1C 7.2 (A) 07/19/2022   HGBA1C 9.6 (A) 01/18/2022    Latest Reference Range & Units 01/02/23 08:31  Sodium 134 - 144 mmol/L 145 (H)  Potassium 3.5 - 5.2 mmol/L 4.8  Chloride 96 - 106 mmol/L 106  CO2 20 - 29 mmol/L 23  Glucose 70 - 99 mg/dL 80  BUN 6 - 24 mg/dL 12  Creatinine 4.40 - 1.02 mg/dL 7.25  Calcium 8.7 - 36.6 mg/dL 9.6  BUN/Creatinine Ratio 9 - 23  15  eGFR >59 mL/min/1.73 84  Alkaline Phosphatase 44 - 121 IU/L 101  Albumin 3.8 - 4.9 g/dL 4.7  AST 0 - 40 IU/L 19  ALT 0 - 32 IU/L 10  Total Protein 6.0 - 8.5 g/dL 7.7  Total Bilirubin 0.0 - 1.2 mg/dL 0.5  BILIRUBIN, DIRECT 0.00 - 0.40 mg/dL 4.40  Total CHOL/HDL Ratio 0.0 - 4.4 ratio 2.3  Cholesterol, Total 100 - 199 mg/dL 347  HDL Cholesterol >42 mg/dL 62  MICROALB/CREAT RATIO 0 - 29 mg/g creat 29  Triglycerides 0 - 149 mg/dL 92  VLDL Cholesterol Cal 5 - 40 mg/dL 17  LDL Chol Calc (NIH) 0 - 99 mg/dL 65    Latest Reference Range & Units 01/02/23 08:31  Glucose 70 - 99 mg/dL 80  Hemoglobin V9D 4.8 - 5.6 % 7.2 (H)  Est. average glucose Bld gHb Est-mCnc mg/dL 638  (H): Data is abnormally high    ASSESSMENT / PLAN / RECOMMENDATIONS:   1) Type 2 Diabetes Mellitus, Sub-optimally  controlled, With microlabuminuria and macrovascular  complications - Most recent A1c of 7.2%. Goal A1c < 7.0 %.     -A1c remains stable -She has occasional constipation, encouraged hydration, fiber intake as well as magnesium if needed -Decrease Cheryl Middleton as below to prevent hypoglycemia -Labs were reviewed from  outside facility   MEDICATIONS: Continue metformin 750 mg XR daily Continue Farxiga 10 mg daily Continue Ozempic 2 mg weekly Decrease Tresiba 18 units daily  EDUCATION / INSTRUCTIONS: BG monitoring instructions: Patient is instructed to check her blood sugars 3 times a day, before meals. Call [facility] if: BG persistently < 70  I reviewed the Rule of 15 for the treatment of hypoglycemia in detail with the patient. Literature supplied.    2) Diabetic complications:  Eye: Does not have known diabetic retinopathy.  Neuro/ Feet: Does not have known diabetic peripheral neuropathy Renal: Patient does not have known baseline CKD. She  is  on an ACEI/ARB at present.    3) Microalbuminuria:  -This has been intermittently elevated since 2019.  Last checkup was 12/2022 which shows normalization -She is already on losartan   F/U in 6 months     Signed electronically by: Lyndle Herrlich, MD  [facility] Endocr[facility]ology  [facility] [address] [place], [place] [address] Phone: [phone] FAX: [phone]   CC: Babs Sciara, MD [address] [place] [address] Phone: [phone]  Fax: [phone]  Return to Endocrinology clinic as below: No future appointments.

## 2023-02-05 ENCOUNTER — Telehealth: Payer: Self-pay

## 2023-02-05 ENCOUNTER — Encounter: Payer: Self-pay | Admitting: Internal Medicine

## 2023-02-05 ENCOUNTER — Other Ambulatory Visit (HOSPITAL_COMMUNITY): Payer: Self-pay

## 2023-02-05 NOTE — Telephone Encounter (Signed)
Pharmacy Patient Advocate Encounter   Received notification from Pt Calls Messages that prior authorization for Ozempic is required/requested.   Insurance verification completed.   The patient is insured through [facility] .   Per test claim: PA required; PA submitted to above mentioned insurance via CoverMyMeds Key/confirmation #/EOC [id number] Status is pending

## 2023-02-05 NOTE — Telephone Encounter (Signed)
Ozempic needs PA  

## 2023-02-07 NOTE — Telephone Encounter (Signed)
Pharmacy Patient Advocate Encounter  Received notification from [facility] that Prior Authorization for Ozempic has been APPROVED from 02-05-2023 to 02-04-2026   PA #/Case ID/Reference #: [id number]

## 2023-02-28 ENCOUNTER — Other Ambulatory Visit (HOSPITAL_COMMUNITY): Payer: Self-pay | Admitting: Family Medicine

## 2023-02-28 DIAGNOSIS — Z1231 Encounter for screening mammogram for malignant neoplasm of breast: Secondary | ICD-10-CM

## 2023-03-01 ENCOUNTER — Inpatient Hospital Stay (HOSPITAL_COMMUNITY): Admission: RE | Admit: 2023-03-01 | Payer: BC Managed Care – PPO | Source: Ambulatory Visit

## 2023-03-01 DIAGNOSIS — Z1231 Encounter for screening mammogram for malignant neoplasm of breast: Secondary | ICD-10-CM

## 2023-03-05 ENCOUNTER — Ambulatory Visit (HOSPITAL_COMMUNITY)
Admission: RE | Admit: 2023-03-05 | Discharge: 2023-03-05 | Disposition: A | Discharge status: Home / Self Care | Payer: BC Managed Care – PPO | Source: Ambulatory Visit | Attending: Family Medicine | Admitting: Family Medicine

## 2023-03-05 ENCOUNTER — Encounter (HOSPITAL_COMMUNITY): Payer: Self-pay

## 2023-03-05 DIAGNOSIS — Z1231 Encounter for screening mammogram for malignant neoplasm of breast: Secondary | ICD-10-CM | POA: Insufficient documentation

## 2023-03-05 DIAGNOSIS — R9230 Dense breasts, unspecified: Secondary | ICD-10-CM | POA: Insufficient documentation

## 2023-06-18 ENCOUNTER — Emergency Department (HOSPITAL_COMMUNITY)

## 2023-06-18 ENCOUNTER — Inpatient Hospital Stay (HOSPITAL_COMMUNITY)
Admission: EM | Admit: 2023-06-18 | Discharge: 2023-06-20 | DRG: 637 | Disposition: A | Discharge status: Home / Self Care | Attending: Internal Medicine | Admitting: Internal Medicine

## 2023-06-18 ENCOUNTER — Other Ambulatory Visit: Payer: Self-pay

## 2023-06-18 ENCOUNTER — Encounter (HOSPITAL_COMMUNITY): Payer: Self-pay

## 2023-06-18 DIAGNOSIS — E785 Hyperlipidemia, unspecified: Secondary | ICD-10-CM | POA: Diagnosis present

## 2023-06-18 DIAGNOSIS — Z882 Allergy status to sulfonamides status: Secondary | ICD-10-CM

## 2023-06-18 DIAGNOSIS — E1110 Type 2 diabetes mellitus with ketoacidosis without coma: Principal | ICD-10-CM | POA: Diagnosis present

## 2023-06-18 DIAGNOSIS — E1310 Other specified diabetes mellitus with ketoacidosis without coma: Secondary | ICD-10-CM

## 2023-06-18 DIAGNOSIS — Z7984 Long term (current) use of oral hypoglycemic drugs: Secondary | ICD-10-CM | POA: Diagnosis not present

## 2023-06-18 DIAGNOSIS — Z833 Family history of diabetes mellitus: Secondary | ICD-10-CM | POA: Diagnosis not present

## 2023-06-18 DIAGNOSIS — E1159 Type 2 diabetes mellitus with other circulatory complications: Secondary | ICD-10-CM | POA: Diagnosis not present

## 2023-06-18 DIAGNOSIS — Z85810 Personal history of malignant neoplasm of tongue: Secondary | ICD-10-CM

## 2023-06-18 DIAGNOSIS — Z881 Allergy status to other antibiotic agents status: Secondary | ICD-10-CM | POA: Diagnosis not present

## 2023-06-18 DIAGNOSIS — E876 Hypokalemia: Secondary | ICD-10-CM | POA: Diagnosis present

## 2023-06-18 DIAGNOSIS — Z825 Family history of asthma and other chronic lower respiratory diseases: Secondary | ICD-10-CM | POA: Diagnosis not present

## 2023-06-18 DIAGNOSIS — N179 Acute kidney failure, unspecified: Secondary | ICD-10-CM | POA: Diagnosis present

## 2023-06-18 DIAGNOSIS — Z880 Allergy status to penicillin: Secondary | ICD-10-CM

## 2023-06-18 DIAGNOSIS — Z841 Family history of disorders of kidney and ureter: Secondary | ICD-10-CM | POA: Diagnosis not present

## 2023-06-18 DIAGNOSIS — Z7982 Long term (current) use of aspirin: Secondary | ICD-10-CM

## 2023-06-18 DIAGNOSIS — I10 Essential (primary) hypertension: Secondary | ICD-10-CM | POA: Diagnosis present

## 2023-06-18 DIAGNOSIS — Z91148 Patient's other noncompliance with medication regimen for other reason: Secondary | ICD-10-CM | POA: Diagnosis not present

## 2023-06-18 DIAGNOSIS — Z794 Long term (current) use of insulin: Secondary | ICD-10-CM | POA: Diagnosis not present

## 2023-06-18 DIAGNOSIS — Z8249 Family history of ischemic heart disease and other diseases of the circulatory system: Secondary | ICD-10-CM

## 2023-06-18 DIAGNOSIS — E860 Dehydration: Secondary | ICD-10-CM | POA: Diagnosis present

## 2023-06-18 DIAGNOSIS — Z79899 Other long term (current) drug therapy: Secondary | ICD-10-CM

## 2023-06-18 DIAGNOSIS — Z886 Allergy status to analgesic agent status: Secondary | ICD-10-CM | POA: Diagnosis not present

## 2023-06-18 DIAGNOSIS — U071 COVID-19: Secondary | ICD-10-CM | POA: Diagnosis present

## 2023-06-18 DIAGNOSIS — E119 Type 2 diabetes mellitus without complications: Secondary | ICD-10-CM

## 2023-06-18 LAB — LACTIC ACID, PLASMA
Lactic Acid, Venous: 1.8 mmol/L (ref 0.5–1.9)
Lactic Acid, Venous: 1.5 mmol/L (ref 0.5–1.9)

## 2023-06-18 LAB — URINALYSIS, ROUTINE W REFLEX MICROSCOPIC
Bacteria, UA: NONE SEEN
Bilirubin Urine: NEGATIVE
Glucose, UA: >=500 mg/dL — AB
Ketones, ur: 80 mg/dL — AB
Leukocytes,Ua: NEGATIVE
Nitrite: NEGATIVE
Protein, ur: 100 mg/dL — AB
Specific Gravity, Urine: 1.024 (ref 1.005–1.030)
pH: 5 (ref 5.0–8.0)

## 2023-06-18 LAB — GLUCOSE, CAPILLARY
Glucose-Capillary: 146 mg/dL — ABNORMAL HIGH (ref 70–99)
Glucose-Capillary: 156 mg/dL — ABNORMAL HIGH (ref 70–99)
Glucose-Capillary: 165 mg/dL — ABNORMAL HIGH (ref 70–99)
Glucose-Capillary: 172 mg/dL — ABNORMAL HIGH (ref 70–99)
Glucose-Capillary: 180 mg/dL — ABNORMAL HIGH (ref 70–99)

## 2023-06-18 LAB — CBC WITH DIFFERENTIAL/PLATELET
Abs Immature Granulocytes: 0.03 10*3/uL (ref 0.00–0.07)
Basophils Absolute: 0 10*3/uL (ref 0.0–0.1)
Basophils Relative: 0 %
Eosinophils Absolute: 0 10*3/uL (ref 0.0–0.5)
Eosinophils Relative: 0 %
HCT: 47.4 % — ABNORMAL HIGH (ref 36.0–46.0)
Hemoglobin: 15.4 g/dL — ABNORMAL HIGH (ref 12.0–15.0)
Immature Granulocytes: 0 %
Lymphocytes Relative: 18 %
Lymphs Abs: 1.4 10*3/uL (ref 0.7–4.0)
MCH: 29.8 pg (ref 26.0–34.0)
MCHC: 32.5 g/dL (ref 30.0–36.0)
MCV: 91.7 fL (ref 80.0–100.0)
Monocytes Absolute: 0.3 10*3/uL (ref 0.1–1.0)
Monocytes Relative: 4 %
Neutro Abs: 5.9 10*3/uL (ref 1.7–7.7)
Neutrophils Relative %: 78 %
Platelets: 346 10*3/uL (ref 150–400)
RBC: 5.17 MIL/uL — ABNORMAL HIGH (ref 3.87–5.11)
RDW: 13.9 % (ref 11.5–15.5)
WBC: 7.6 10*3/uL (ref 4.0–10.5)
nRBC: 0 % (ref 0.0–0.2)

## 2023-06-18 LAB — BETA-HYDROXYBUTYRIC ACID
Beta-Hydroxybutyric Acid: >8 mmol/L — ABNORMAL HIGH (ref 0.05–0.27)
Beta-Hydroxybutyric Acid: 4.35 mmol/L — ABNORMAL HIGH (ref 0.05–0.27)

## 2023-06-18 LAB — COMPREHENSIVE METABOLIC PANEL WITH GFR
ALT: 15 U/L (ref 0–44)
AST: 14 U/L — ABNORMAL LOW (ref 15–41)
Albumin: 4.2 g/dL (ref 3.5–5.0)
Alkaline Phosphatase: 67 U/L (ref 38–126)
Anion gap: 25 — ABNORMAL HIGH (ref 5–15)
BUN: 43 mg/dL — ABNORMAL HIGH (ref 6–20)
CO2: 15 mmol/L — ABNORMAL LOW (ref 22–32)
Calcium: 9.7 mg/dL (ref 8.9–10.3)
Chloride: 96 mmol/L — ABNORMAL LOW (ref 98–111)
Creatinine, Ser: 1.33 mg/dL — ABNORMAL HIGH (ref 0.44–1.00)
GFR, Estimated: 47 mL/min — ABNORMAL LOW (ref >60)
Glucose, Bld: 301 mg/dL — ABNORMAL HIGH (ref 70–99)
Potassium: 4.7 mmol/L (ref 3.5–5.1)
Sodium: 136 mmol/L (ref 135–145)
Total Bilirubin: 1.7 mg/dL — ABNORMAL HIGH (ref 0.0–1.2)
Total Protein: 9 g/dL — ABNORMAL HIGH (ref 6.5–8.1)

## 2023-06-18 LAB — BLOOD GAS, VENOUS
Acid-base deficit: 7.2 mmol/L — ABNORMAL HIGH (ref 0.0–2.0)
Bicarbonate: 18 mmol/L — ABNORMAL LOW (ref 20.0–28.0)
Drawn by: 7012
O2 Saturation: 55.3 %
Patient temperature: 36.7
pCO2, Ven: 34 mmHg — ABNORMAL LOW (ref 44–60)
pH, Ven: 7.32 (ref 7.25–7.43)
pO2, Ven: 33 mmHg (ref 32–45)

## 2023-06-18 LAB — BASIC METABOLIC PANEL WITH GFR
Anion gap: 26 — ABNORMAL HIGH (ref 5–15)
Anion gap: 15 (ref 5–15)
BUN: 39 mg/dL — ABNORMAL HIGH (ref 6–20)
BUN: 30 mg/dL — ABNORMAL HIGH (ref 6–20)
CO2: 14 mmol/L — ABNORMAL LOW (ref 22–32)
CO2: 19 mmol/L — ABNORMAL LOW (ref 22–32)
Calcium: 9.5 mg/dL (ref 8.9–10.3)
Calcium: 8.8 mg/dL — ABNORMAL LOW (ref 8.9–10.3)
Chloride: 99 mmol/L (ref 98–111)
Chloride: 103 mmol/L (ref 98–111)
Creatinine, Ser: 1.16 mg/dL — ABNORMAL HIGH (ref 0.44–1.00)
Creatinine, Ser: 0.91 mg/dL (ref 0.44–1.00)
GFR, Estimated: 55 mL/min — ABNORMAL LOW (ref >60)
GFR, Estimated: >60 mL/min (ref >60)
Glucose, Bld: 241 mg/dL — ABNORMAL HIGH (ref 70–99)
Glucose, Bld: 149 mg/dL — ABNORMAL HIGH (ref 70–99)
Potassium: 4.4 mmol/L (ref 3.5–5.1)
Potassium: 3.7 mmol/L (ref 3.5–5.1)
Sodium: 139 mmol/L (ref 135–145)
Sodium: 137 mmol/L (ref 135–145)

## 2023-06-18 LAB — CBG MONITORING, ED
Glucose-Capillary: 229 mg/dL — ABNORMAL HIGH (ref 70–99)
Glucose-Capillary: 218 mg/dL — ABNORMAL HIGH (ref 70–99)
Glucose-Capillary: 204 mg/dL — ABNORMAL HIGH (ref 70–99)

## 2023-06-18 LAB — MRSA NEXT GEN BY PCR, NASAL: MRSA by PCR Next Gen: NOT DETECTED

## 2023-06-18 MED ORDER — DEXTROSE IN LACTATED RINGERS 5 % IV SOLN: INTRAVENOUS | Status: DC

## 2023-06-18 MED ORDER — ASPIRIN 81 MG PO CHEW
81.0000 mg | CHEWABLE_TABLET | Freq: Every day | ORAL | Status: DC
  Administered 2023-06-19 – 2023-06-20 (×2): 81 mg via ORAL
  Filled 2023-06-18 (×2): qty 1

## 2023-06-18 MED ORDER — LORATADINE 10 MG PO TABS
10.0000 mg | ORAL_TABLET | Freq: Every day | ORAL | Status: DC
  Administered 2023-06-19 – 2023-06-20 (×2): 10 mg via ORAL
  Filled 2023-06-18 (×2): qty 1

## 2023-06-18 MED ORDER — LACTATED RINGERS IV BOLUS
1000.0000 mL | Freq: Once | INTRAVENOUS | Status: AC
  Administered 2023-06-18: 1000 mL via INTRAVENOUS

## 2023-06-18 MED ORDER — DEXTROSE 50 % IV SOLN: 0.0000 mL | INTRAVENOUS | Status: DC | PRN

## 2023-06-18 MED ORDER — LACTATED RINGERS IV SOLN: INTRAVENOUS | Status: DC

## 2023-06-18 MED ORDER — INSULIN REGULAR(HUMAN) IN NACL 100-0.9 UT/100ML-% IV SOLN
INTRAVENOUS | Status: DC
  Administered 2023-06-18: 6 [IU]/h via INTRAVENOUS
  Filled 2023-06-18: qty 100

## 2023-06-18 MED ORDER — ALBUTEROL SULFATE (2.5 MG/3ML) 0.083% IN NEBU: 2.5000 mg | INHALATION_SOLUTION | Freq: Four times a day (QID) | RESPIRATORY_TRACT | Status: DC | PRN

## 2023-06-18 MED ORDER — POTASSIUM CHLORIDE 10 MEQ/100ML IV SOLN
10.0000 meq | INTRAVENOUS | Status: AC
  Administered 2023-06-18 (×2): 10 meq via INTRAVENOUS
  Filled 2023-06-18 (×2): qty 100

## 2023-06-18 MED ORDER — GUAIFENESIN-DM 100-10 MG/5ML PO SYRP
15.0000 mL | ORAL_SOLUTION | Freq: Three times a day (TID) | ORAL | Status: AC
  Administered 2023-06-18 – 2023-06-19 (×3): 15 mL via ORAL
  Filled 2023-06-18 (×3): qty 15

## 2023-06-18 MED ORDER — ENOXAPARIN SODIUM 40 MG/0.4ML IJ SOSY
40.0000 mg | PREFILLED_SYRINGE | INTRAMUSCULAR | Status: DC
  Administered 2023-06-18 – 2023-06-19 (×2): 40 mg via SUBCUTANEOUS
  Filled 2023-06-18 (×2): qty 0.4

## 2023-06-18 MED ORDER — METOCLOPRAMIDE HCL 5 MG/ML IJ SOLN
10.0000 mg | Freq: Once | INTRAMUSCULAR | Status: AC
  Administered 2023-06-18: 10 mg via INTRAVENOUS
  Filled 2023-06-18: qty 2

## 2023-06-18 MED ORDER — CHLORHEXIDINE GLUCONATE CLOTH 2 % EX PADS
6.0000 | MEDICATED_PAD | Freq: Every day | CUTANEOUS | Status: DC
  Administered 2023-06-19 – 2023-06-20 (×2): 6 via TOPICAL

## 2023-06-18 NOTE — ED Notes (Signed)
Airborne precautions sign placed on pts door

## 2023-06-18 NOTE — ED Notes (Addendum)
 Verbal order by PA-C: awaiting beta-hydroxybutyric lab to result in order to start insulin drip as pts cbg 229

## 2023-06-18 NOTE — Assessment & Plan Note (Signed)
 Blood sugars 301, anion gap of 25, serum bicarb of 15.  Beta hydroxybutyrate elevated > 8.  VBG shows pH of 7.3, pCO2 of 34.  Likely from COVID virus infection, and so not taking her insulin for at least 5 days. -2 L bolus so far given, LR infusion initially started this evening her blood sugar is less than 250, continue with D5 LR @125cc /hr  -Continue insulin drip until resolution of acidosis -BMP every 4 hourly - HgbA1c - Hold home Tresiba 22 units daily, metformin, semaglutide and [place]

## 2023-06-18 NOTE — Assessment & Plan Note (Signed)
 Creatinine 1.33, baseline 0.6-0.8.  Likely due to DKA, dehydration from poor oral intake. -Total of 2 L bolus given, LR initially started, now switched to D5 fluids. -Hold home losartan

## 2023-06-18 NOTE — Assessment & Plan Note (Signed)
 Stable. -Hold losartan with AKI

## 2023-06-18 NOTE — Assessment & Plan Note (Deleted)
-   HgbA1c -Continue insulin drip -2 L bolus given, continue LR at 125 cc/h

## 2023-06-18 NOTE — ED Provider Notes (Signed)
 [place] EMERGENCY DEPARTMENT AT [facility] Provider Note   CSN: [id number] Arrival date & time: 06/18/23  1100     History  Chief Complaint  Patient presents with   Covid Positive    Cheryl Middleton is a 58 y.o. female with a history including type 2 diabetes, hypertension, history of CVA presenting for generalized weakness and poor p.o. intake for the past week, was seen at [place] urgent care today and diagnosed with COVID-19.  She states she has felt ill for approximately 1 week, describing cough which is sometimes productive of a brown sputum, but mostly just a dry cough.  She denies shortness of breath, chest pain, abdominal pain but does have nausea and has had poor p.o. intake both for solid and fluids.  She endorses generalized fatigue and weakness.  She has been unable to take her home medications as well since being ill.  She does endorse nausea and vomiting, no abdominal pain, no diarrhea.  She also denies chest pain, she does get short of breath with exertion.  The history is provided by the patient.       Home Medications Prior to Admission medications   Medication Sig Start Date End Date Taking? Authorizing Provider  aspirin 81 MG chewable tablet Chew 81 mg by mouth in the morning.    [provider]  cetirizine (ZYRTEC) 10 MG tablet TAKE 1 TABLET BY MOUTH EVERY DAY 09/22/22   Babs Sciara, MD  dapagliflozin propanediol (FARXIGA) 10 MG TABS tablet Take 1 tablet (10 mg total) by mouth daily before breakfast. 07/19/22   Shamleffer, Konrad Dolores, MD  Glucagon, rDNA, (GLUCAGON EMERGENCY) 1 MG KIT Inject if severe low glucose 03/29/21   Luking, Scott A, MD  glucose blood (ONETOUCH VERIO) test strip Check blood sugar 2 times daily 08/28/22   Shamleffer, Konrad Dolores, MD  hydrocortisone 2.5 % cream Apply topically 2 (two) times daily. Apply twice daily for flare ups above neck, maximum 7 days. Patient taking differently: Apply 1 Application topically  2 (two) times daily as needed (skin irritation .). Apply twice daily for flare ups above neck, maximum 7 days. 05/22/22   Birder Robson, MD  Insulin Degludec (TRESIBA) 100 UNIT/ML SOLN Inject 22 Units into the skin daily in the afternoon. Keep as flextouch Patient taking differently: Inject 20 Units into the skin in the morning. Inject 22 Units into the skin daily in the afternoon. Keep as flextouch 07/20/22   Shamleffer, Konrad Dolores, MD  losartan (COZAAR) 25 MG tablet Take 1 tablet (25 mg total) by mouth daily. 08/16/22   Babs Sciara, MD  metFORMIN (GLUCOPHAGE-XR) 750 MG 24 hr tablet Take 1 tablet (750 mg total) by mouth daily with breakfast. 07/19/22   Shamleffer, Konrad Dolores, MD  rosuvastatin (CRESTOR) 20 MG tablet Take 1 tablet (20 mg total) by mouth daily. 08/16/22   Babs Sciara, MD  Semaglutide, 2 MG/DOSE, 8 MG/3ML SOPN Inject 2 mg as directed once a week. Patient taking differently: Inject 2 mg as directed every Sunday. 07/19/22   Shamleffer, Konrad Dolores, MD  triamcinolone ointment (KENALOG) 0.1 % Apply 1 Application topically 2 (two) times daily as needed (skin irritation.).    [provider]      Allergies    Ciprofloxacin, Sulfonamide derivatives, Ibuprofen, and Penicillins    Review of Systems   Review of Systems  Constitutional:  Positive for fatigue. Negative for fever.  HENT:  Negative for congestion and sore throat.  Eyes: Negative.   Respiratory:  Positive for cough and shortness of breath. Negative for chest tightness.   Cardiovascular:  Negative for chest pain.  Gastrointestinal:  Positive for nausea and vomiting. Negative for abdominal pain and diarrhea.  Genitourinary: Negative.   Musculoskeletal:  Positive for myalgias. Negative for arthralgias, joint swelling and neck pain.  Skin: Negative.  Negative for rash and wound.  Neurological:  Negative for dizziness, weakness, light-headedness, numbness and headaches.  Psychiatric/Behavioral: Negative.       Physical Exam Updated Vital Signs BP 131/73   Pulse (!) 116   Temp 98.4 F (36.9 C) (Oral)   Resp 17   Ht 5' 1.5" (1.562 m)   Wt 65.4 kg   LMP 07/22/2016   SpO2 97%   BMI 26.80 kg/m  Physical Exam Vitals and nursing note reviewed.  Constitutional:      Appearance: She is well-developed.  HENT:     Head: Normocephalic and atraumatic.     Mouth/Throat:     Mouth: Mucous membranes are dry.  Eyes:     Conjunctiva/sclera: Conjunctivae normal.  Cardiovascular:     Rate and Rhythm: Regular rhythm. Tachycardia present.     Heart sounds: Normal heart sounds.  Pulmonary:     Effort: Pulmonary effort is normal. No respiratory distress.     Breath sounds: Normal breath sounds. No wheezing, rhonchi or rales.  Abdominal:     General: Bowel sounds are normal.     Palpations: Abdomen is soft.     Tenderness: There is no abdominal tenderness.  Musculoskeletal:        General: Normal range of motion.     Cervical back: Normal range of motion.  Skin:    General: Skin is warm and dry.  Neurological:     Mental Status: She is alert and oriented to person, place, and time.     ED Results / Procedures / Treatments   Labs (all labs ordered are listed, but only abnormal results are displayed) Labs Reviewed  CBC WITH DIFFERENTIAL/PLATELET - Abnormal; Notable for the following components:      Result Value   RBC 5.17 (*)    Hemoglobin 15.4 (*)    HCT 47.4 (*)    All other components within normal limits  COMPREHENSIVE METABOLIC PANEL WITH GFR - Abnormal; Notable for the following components:   Chloride 96 (*)    CO2 15 (*)    Glucose, Bld 301 (*)    BUN 43 (*)    Creatinine, Ser 1.33 (*)    Total Protein 9.0 (*)    AST 14 (*)    Total Bilirubin 1.7 (*)    GFR, Estimated 47 (*)    Anion gap 25 (*)    All other components within normal limits  BETA-HYDROXYBUTYRIC ACID - Abnormal; Notable for the following components:   Beta-Hydroxybutyric Acid >8.00 (*)    All other  components within normal limits  BLOOD GAS, VENOUS - Abnormal; Notable for the following components:   pCO2, Ven 34 (*)    Bicarbonate 18.0 (*)    Acid-base deficit 7.2 (*)    All other components within normal limits  CBG MONITORING, ED - Abnormal; Notable for the following components:   Glucose-Capillary 229 (*)    All other components within normal limits  CBG MONITORING, ED - Abnormal; Notable for the following components:   Glucose-Capillary 218 (*)    All other components within normal limits  LACTIC ACID, PLASMA  LACTIC ACID, PLASMA  URINALYSIS, ROUTINE W REFLEX MICROSCOPIC  BASIC METABOLIC PANEL WITH GFR    EKG None  Radiology DG Chest 2 View Result Date: 06/18/2023 CLINICAL DATA:  Shortness of breath.  COVID infection. EXAM: CHEST - 2 VIEW COMPARISON:  None Available. FINDINGS: The heart size and mediastinal contours are within normal limits. Both lungs are clear. No pleural effusion. IMPRESSION: No active cardiopulmonary disease. Electronically Signed   By: Danae Orleans M.D.   On: 06/18/2023 12:31    Procedures Procedures    Medications Ordered in ED Medications  insulin regular, human (MYXREDLIN) 100 units/ 100 mL infusion (0 Units/hr Intravenous Hold 06/18/23 1547)  lactated ringers infusion ( Intravenous Not Given 06/18/23 1544)  dextrose 5 % in lactated ringers infusion (0 mLs Intravenous Hold 06/18/23 1547)  dextrose 50 % solution 0-50 mL (has no administration in time range)  potassium chloride 10 mEq in 100 mL IVPB (0 mEq Intravenous Hold 06/18/23 1548)  lactated ringers bolus 1,000 mL (0 mLs Intravenous Stopped 06/18/23 1549)  metoCLOPramide (REGLAN) injection 10 mg (10 mg Intravenous Given 06/18/23 1322)    ED Course/ Medical Decision Making/ A&P                                 Medical Decision Making Patient presenting with generalized fatigue, cough, shortness of breath with exertion, myalgias and was diagnosed with COVID-19 prior to arrival at a local  urgent care.  She has her lab results with her confirming positivity for COVID-19.  She endorses generalized fatigue and reduced ability to eat and hydrate at home.  Reports nausea and vomiting, no abdominal pain.  Differential including constellation of symptoms from known viral COVID-19, could also represent electrolyte abnormalities, pneumonia,   ,  Amount and/or Complexity of Data Reviewed Labs: ordered.    Details: Labs significant for electrolyte derangement on her CMET including a reduced CO2 at 15, glucose of 301, she is clinically dehydrated with a BUN of 43 and a creatinine of 1.33, she also has an anion gap of 25, CBC is okay, she has a WBC count of 7.6.  She has a elevated hemoglobin at 15.4, probably from hemoconcentration.  Her beta hydroxybutyrate acid resulted late in the course and is greater than 8 confirming DKA.  She presents with a written COVID-19 results from her urgent care confirming positive test Radiology: ordered.    Details: Chest x-ray is negative for pneumonia. ECG/medicine tests: ordered.    Details: EKG reviewed, sinus tachycardia with rate of 116. Discussion of management or test interpretation with external provider(s): Discussed with Dr. Mariea Clonts who agrees with admission  Risk Decision regarding hospitalization.    CRITICAL CARE Performed by: Burgess Amor Total critical care time: 45 minutes Critical care time was exclusive of separately billable procedures and treating other patients. Critical care was necessary to treat or prevent imminent or life-threatening deterioration. Critical care was time spent personally by me on the following activities: development of treatment plan with patient and/or surrogate as well as nursing, discussions with consultants, evaluation of patient's response to treatment, examination of patient, obtaining history from patient or surrogate, ordering and performing treatments and interventions, ordering and review of laboratory  studies, ordering and review of radiographic studies, pulse oximetry and re-evaluation of patient's condition.          Final Clinical Impression(s) / ED Diagnoses Final diagnoses:  Diabetic ketoacidosis without coma associated with type 2 diabetes mellitus (HCC)  COVID-19    Rx / DC Orders ED Discharge Orders     None         Victoriano Lain 06/18/23 1722    Lonell Grandchild, MD 06/18/23 2142

## 2023-06-18 NOTE — ED Triage Notes (Signed)
 Pt arrived via POV from urgent care due to testing positive for Covid 19, and needing possible lab work and IV fluids. Pt reports she has been unable to take her medications this past weekend due to feeling sick.

## 2023-06-18 NOTE — Assessment & Plan Note (Addendum)
 Symptoms of 1 week duration.  Not hypoxic.  Chest x-ray without pneumonia.  Has had prior COVID infection, but symptoms were not this severe.  -Patient outside window for benefit from antivirals -Conservative management with mucolytics, inhaler PRN -As she is not hypoxic and chest x-ray is clear, I have held off on checking inflammatory panels

## 2023-06-18 NOTE — H&P (Signed)
 History and Physical    Cheryl Middleton:[id number] DOB: 1965-08-05 DOA: 06/18/2023  PCP: Babs Sciara, MD   Patient coming from: Home  I have personally briefly reviewed patient's old medical records in [facility]  Chief Complaint: Flu-like symptoms  HPI: Cheryl Middleton is a 58 y.o. female with medical history significant for diabetes mellitus, hypertension, CVA.  Patient presented to the ED with complaints of nausea, productive cough, myalgia of 1 week duration.  She reports she was diagnosed with COVID infection today at an urgent care-[place] [facility].  Has not been able to eat for about a week, so she has not taking her insulin for about 5 days now, or any of her oral diabetic medications for about a week now. She reports palpitations, some difficulty breathing with exertion.  No diarrhea, no abdominal pain, no urinary symptoms.  ED Course: Tachycardic to 125, Tmax 98.4.  Respiratory rate 15-20.  Blood pressure systolic 129 -153.  O2 sats greater than 96% on room air.  Blood glucose 301, anion gap of 85, serum bicarb of 15.   Lactic acid 1.8.   BHB greater than 8.   pH 7.3, pCO2 of 34.   Chest x-ray clear. EKG shows sinus tachycardia rate 116, QTc 452. Insulin drip started, 1 L bolus given.  LR at 125 cc/h started.  Review of Systems: As per HPI all other systems reviewed and negative.  Past Medical History:  Diagnosis Date   Cancer (HCC)    tongue   Diabetes mellitus, type II (HCC)    Hyperlipidemia    Hypertension    Malignant neoplasm of tongue (HCC)    Tongue   Stroke [facility])     Past Surgical History:  Procedure Laterality Date   BREAST EXCISIONAL BIOPSY  12/1989   CESAREAN SECTION  01/2003   COLONOSCOPY N/A 04/20/2017   Procedure: COLONOSCOPY;  Surgeon: West Bali, MD;  Location: [facility];  Service: Endoscopy;  Laterality: N/A;  12:00   COLONOSCOPY WITH PROPOFOL N/A 08/15/2022   Procedure: COLONOSCOPY WITH PROPOFOL;  Surgeon: Lanelle Bal, DO;  Location: [facility];  Service: Endoscopy;  Laterality: N/A;  12:15pm, asa 3   TONGUE SURGERY  10/19/1999   tongue cancer   TRACHEOSTOMY       reports that she has never smoked. She has never used smokeless tobacco. She reports that she does not drink alcohol and does not use drugs.  Allergies  Allergen Reactions   Ciprofloxacin Anaphylaxis   Sulfonamide Derivatives Shortness Of Breath   Ibuprofen Rash    With higher doses   Penicillins Rash    Has patient had a PCN reaction causing immediate rash, facial/tongue/throat swelling, SOB or lightheadedness with hypotension: Yes Has patient had a PCN reaction causing severe rash involving mucus membranes or skin necrosis: no Has patient had a PCN reaction that required hospitalization: yes Has patient had a PCN reaction occurring within the last 10 years: yes If all of the above answers are "NO", then may proceed with Cephalosporin use.     Family History  Problem Relation Age of Onset   Hypertension Mother    Diabetes Mother    Kidney disease Mother    Hypertension Father    Diabetes Father    Congestive Heart Failure Father    COPD Father    Hypertension Sister    Diabetes Sister    Diabetes Sister        borderline   ADD /  ADHD Son    Eczema Son     Prior to Admission medications   Medication Sig Start Date End Date Taking? Authorizing Provider  aspirin 81 MG chewable tablet Chew 81 mg by mouth in the morning.    [provider]  cetirizine (ZYRTEC) 10 MG tablet TAKE 1 TABLET BY MOUTH EVERY DAY 09/22/22   Babs Sciara, MD  dapagliflozin propanediol (FARXIGA) 10 MG TABS tablet Take 1 tablet (10 mg total) by mouth daily before breakfast. 07/19/22   Shamleffer, Konrad Dolores, MD  Glucagon, rDNA, (GLUCAGON EMERGENCY) 1 MG KIT Inject if severe low glucose 03/29/21   Luking, Scott A, MD  glucose blood (ONETOUCH VERIO) test strip Check blood sugar 2 times daily 08/28/22   Shamleffer, Konrad Dolores, MD   hydrocortisone 2.5 % cream Apply topically 2 (two) times daily. Apply twice daily for flare ups above neck, maximum 7 days. Patient taking differently: Apply 1 Application topically 2 (two) times daily as needed (skin irritation .). Apply twice daily for flare ups above neck, maximum 7 days. 05/22/22   Birder Robson, MD  Insulin Degludec (TRESIBA) 100 UNIT/ML SOLN Inject 22 Units into the skin daily in the afternoon. Keep as flextouch Patient taking differently: Inject 20 Units into the skin in the morning. Inject 22 Units into the skin daily in the afternoon. Keep as flextouch 07/20/22   Shamleffer, Konrad Dolores, MD  losartan (COZAAR) 25 MG tablet Take 1 tablet (25 mg total) by mouth daily. 08/16/22   Babs Sciara, MD  metFORMIN (GLUCOPHAGE-XR) 750 MG 24 hr tablet Take 1 tablet (750 mg total) by mouth daily with breakfast. 07/19/22   Shamleffer, Konrad Dolores, MD  rosuvastatin (CRESTOR) 20 MG tablet Take 1 tablet (20 mg total) by mouth daily. 08/16/22   Babs Sciara, MD  Semaglutide, 2 MG/DOSE, 8 MG/3ML SOPN Inject 2 mg as directed once a week. Patient taking differently: Inject 2 mg as directed every Sunday. 07/19/22   Shamleffer, Konrad Dolores, MD  triamcinolone ointment (KENALOG) 0.1 % Apply 1 Application topically 2 (two) times daily as needed (skin irritation.).    [provider]    Physical Exam: Vitals:   06/18/23 1557 06/18/23 1630 06/18/23 1645 06/18/23 1714  BP:  133/71 131/73   Pulse: (!) 115 (!) 117 (!) 116   Resp: 20 15 17    Temp:    98.3 F (36.8 C)  TempSrc:    Oral  SpO2: 98% 98% 97%   Weight:      Height:        Constitutional: NAD, calm, comfortable Vitals:   06/18/23 1557 06/18/23 1630 06/18/23 1645 06/18/23 1714  BP:  133/71 131/73   Pulse: (!) 115 (!) 117 (!) 116   Resp: 20 15 17    Temp:    98.3 F (36.8 C)  TempSrc:    Oral  SpO2: 98% 98% 97%   Weight:      Height:       Eyes: PERRL, lids and conjunctivae normal ENMT: Mucous membranes are  dry, chronic surgical scar to right side of tongue. Neck: normal, supple, no masses, no thyromegaly Respiratory: clear to auscultation bilaterally, no wheezing, no crackles. Normal respiratory effort. No accessory muscle use.  Cardiovascular: Tachycardic, regular rate and rhythm, no murmurs / rubs / gallops. No extremity edema.  Abdomen: no tenderness, no masses palpated. No hepatosplenomegaly. Bowel sounds positive.  Musculoskeletal: no clubbing / cyanosis. No joint deformity upper and lower extremities.  Skin: no rashes, lesions,  ulcers. No induration Neurologic: No facial asymmetry, moving extremities spontaneously, speech fluent. Psychiatric: Normal judgment and insight. Alert and oriented x 3. Normal mood.   Labs on Admission: I have personally reviewed following labs and imaging studies  CBC: Recent Labs  Lab 06/18/23 1137  WBC 7.6  NEUTROABS 5.9  HGB 15.4*  HCT 47.4*  MCV 91.7  PLT 346   Basic Metabolic Panel: Recent Labs  Lab 06/18/23 1137  NA 136  K 4.7  CL 96*  CO2 15*  GLUCOSE 301*  BUN 43*  CREATININE 1.33*  CALCIUM 9.7   GFR: Estimated Creatinine Clearance: 41.5 mL/min (A) (by C-G formula based on SCr of 1.33 mg/dL (H)). Liver Function Tests: Recent Labs  Lab 06/18/23 1137  AST 14*  ALT 15  ALKPHOS 67  BILITOT 1.7*  PROT 9.0*  ALBUMIN 4.2   CBG: Recent Labs  Lab 06/18/23 1542 06/18/23 1655  GLUCAP 229* 218*   Urine analysis:    Component Value Date/Time   COLORURINE YELLOW 06/18/2023 1532   APPEARANCEUR CLEAR 06/18/2023 1532   LABSPEC 1.024 06/18/2023 1532   PHURINE 5.0 06/18/2023 1532   GLUCOSEU >=500 (A) 06/18/2023 1532   GLUCOSEU NEGATIVE 07/31/2008 0000   HGBUR MODERATE (A) 06/18/2023 1532   BILIRUBINUR NEGATIVE 06/18/2023 1532   KETONESUR 80 (A) 06/18/2023 1532   PROTEINUR 100 (A) 06/18/2023 1532   UROBILINOGEN 0.2 07/31/2008 0000   NITRITE NEGATIVE 06/18/2023 1532   LEUKOCYTESUR NEGATIVE 06/18/2023 1532    Radiological Exams  on Admission: DG Chest 2 View Result Date: 06/18/2023 CLINICAL DATA:  Shortness of breath.  COVID infection. EXAM: CHEST - 2 VIEW COMPARISON:  None Available. FINDINGS: The heart size and mediastinal contours are within normal limits. Both lungs are clear. No pleural effusion. IMPRESSION: No active cardiopulmonary disease. Electronically Signed   By: Danae Orleans M.D.   On: 06/18/2023 12:31    EKG: Independently reviewed.  Sinus tachycardia rate 116, QTc 452.  No significant change from prior.  Assessment/Plan Principal Problem:   DKA (diabetic ketoacidosis) (HCC) Active Problems:   AKI (acute kidney injury) (HCC)   COVID-19 virus infection   Essential hypertension, benign   Diabetes mellitus (HCC)   Assessment and Plan: * DKA (diabetic ketoacidosis) (HCC) Blood sugars 301, anion gap of 25, serum bicarb of 15.  Beta hydroxybutyrate elevated > 8.  VBG shows pH of 7.3, pCO2 of 34.  Likely from COVID virus infection, and so not taking her insulin for at least 5 days. -2 L bolus so far given, LR infusion initially started this evening her blood sugar is less than 250, continue with D5 LR @125cc /hr  -Continue insulin drip until resolution of acidosis -BMP every 4 hourly - HgbA1c - Hold home Tresiba 22 units daily, metformin, semaglutide and Farxiga  AKI (acute kidney injury) (HCC) Creatinine 1.33, baseline 0.6-0.8.  Likely due to DKA, dehydration from poor oral intake. -Total of 2 L bolus given, LR initially started, now switched to D5 fluids. -Hold home losartan   COVID-19 virus infection Symptoms of 1 week duration.  Not hypoxic.  Chest x-ray without pneumonia.  Has had prior COVID infection, but symptoms were not this severe.  -Patient outside window for benefit from antivirals -Conservative management with mucolytics, inhaler PRN -As she is not hypoxic and chest x-ray is clear, I have held off on checking inflammatory panels  Essential hypertension, benign Stable. -Hold losartan  with AKI.   DVT prophylaxis: Lovenox Code Status: Full code Family Communication: Sister Randa Evens at  bedside. Disposition Plan: ~ 2 days Consults called: None Admission status: Inpt Stepdown I certify that at the point of admission it is my clinical judgment that the patient will require inpatient hospital care spanning beyond 2 midnights from the point of admission due to high intensity of service, high risk for further deterioration and high frequency of surveillance required.    CRITICAL CARE Performed by: Onnie Boer   Total critical care time: 70 minutes  Critical care time was exclusive of separately billable procedures and treating other patients.  Critical care was necessary to treat or prevent imminent or life-threatening deterioration.  Critical care was time spent personally by me on the following activities: development of treatment plan with patient and/or surrogate as well as nursing, discussions with consultants, evaluation of patient's response to treatment, examination of patient, obtaining history from patient or surrogate, ordering and performing treatments and interventions, ordering and review of laboratory studies, ordering and review of radiographic studies, pulse oximetry and re-evaluation of patient's condition.   Author: Onnie Boer, MD 06/18/2023 6:18 PM  For on call review [email or web address].

## 2023-06-18 NOTE — Progress Notes (Signed)
 Patient given ginger ale for fluid challenge and was able to consume approximately of beverage without any signs of intolerance. Remaining drink left at bedside for patient.

## 2023-06-19 DIAGNOSIS — E1159 Type 2 diabetes mellitus with other circulatory complications: Secondary | ICD-10-CM

## 2023-06-19 DIAGNOSIS — N179 Acute kidney failure, unspecified: Secondary | ICD-10-CM | POA: Diagnosis not present

## 2023-06-19 DIAGNOSIS — E1110 Type 2 diabetes mellitus with ketoacidosis without coma: Secondary | ICD-10-CM | POA: Diagnosis not present

## 2023-06-19 DIAGNOSIS — U071 COVID-19: Secondary | ICD-10-CM | POA: Diagnosis not present

## 2023-06-19 LAB — GLUCOSE, CAPILLARY
Glucose-Capillary: 148 mg/dL — ABNORMAL HIGH (ref 70–99)
Glucose-Capillary: 122 mg/dL — ABNORMAL HIGH (ref 70–99)
Glucose-Capillary: 202 mg/dL — ABNORMAL HIGH (ref 70–99)
Glucose-Capillary: 138 mg/dL — ABNORMAL HIGH (ref 70–99)
Glucose-Capillary: 186 mg/dL — ABNORMAL HIGH (ref 70–99)
Glucose-Capillary: 182 mg/dL — ABNORMAL HIGH (ref 70–99)

## 2023-06-19 LAB — BASIC METABOLIC PANEL WITH GFR
Anion gap: 8 (ref 5–15)
Anion gap: 9 (ref 5–15)
BUN: 22 mg/dL — ABNORMAL HIGH (ref 6–20)
BUN: 20 mg/dL (ref 6–20)
CO2: 24 mmol/L (ref 22–32)
CO2: 25 mmol/L (ref 22–32)
Calcium: 8.5 mg/dL — ABNORMAL LOW (ref 8.9–10.3)
Calcium: 8.6 mg/dL — ABNORMAL LOW (ref 8.9–10.3)
Chloride: 106 mmol/L (ref 98–111)
Chloride: 105 mmol/L (ref 98–111)
Creatinine, Ser: 0.72 mg/dL (ref 0.44–1.00)
Creatinine, Ser: 0.77 mg/dL (ref 0.44–1.00)
GFR, Estimated: >60 mL/min (ref >60)
GFR, Estimated: >60 mL/min (ref >60)
Glucose, Bld: 148 mg/dL — ABNORMAL HIGH (ref 70–99)
Glucose, Bld: 122 mg/dL — ABNORMAL HIGH (ref 70–99)
Potassium: 3.3 mmol/L — ABNORMAL LOW (ref 3.5–5.1)
Potassium: 3.5 mmol/L (ref 3.5–5.1)
Sodium: 138 mmol/L (ref 135–145)
Sodium: 139 mmol/L (ref 135–145)

## 2023-06-19 LAB — HIV ANTIBODY (ROUTINE TESTING W REFLEX): HIV Screen 4th Generation wRfx: NONREACTIVE

## 2023-06-19 LAB — MAGNESIUM: Magnesium: 2.1 mg/dL (ref 1.7–2.4)

## 2023-06-19 LAB — BETA-HYDROXYBUTYRIC ACID: Beta-Hydroxybutyric Acid: 0.94 mmol/L — ABNORMAL HIGH (ref 0.05–0.27)

## 2023-06-19 MED ORDER — INSULIN GLARGINE-YFGN 100 UNIT/ML ~~LOC~~ SOLN
5.0000 [IU] | Freq: Every day | SUBCUTANEOUS | Status: DC
  Administered 2023-06-19: 5 [IU] via SUBCUTANEOUS
  Filled 2023-06-19 (×2): qty 0.05

## 2023-06-19 MED ORDER — GLUCERNA SHAKE PO LIQD
237.0000 mL | Freq: Two times a day (BID) | ORAL | Status: DC
  Administered 2023-06-20: 237 mL via ORAL

## 2023-06-19 MED ORDER — INSULIN GLARGINE-YFGN 100 UNIT/ML ~~LOC~~ SOLN
8.0000 [IU] | Freq: Two times a day (BID) | SUBCUTANEOUS | Status: DC
  Administered 2023-06-19: 8 [IU] via SUBCUTANEOUS
  Filled 2023-06-19 (×4): qty 0.08

## 2023-06-19 MED ORDER — POTASSIUM CHLORIDE 20 MEQ PO PACK
40.0000 meq | PACK | Freq: Once | ORAL | Status: AC
  Administered 2023-06-19: 40 meq via ORAL
  Filled 2023-06-19: qty 2

## 2023-06-19 MED ORDER — INSULIN GLARGINE-YFGN 100 UNIT/ML ~~LOC~~ SOPN
5.0000 [IU] | PEN_INJECTOR | SUBCUTANEOUS | Status: DC
  Filled 2023-06-19: qty 3

## 2023-06-19 MED ORDER — INSULIN ASPART 100 UNIT/ML IJ SOLN
0.0000 [IU] | INTRAMUSCULAR | Status: DC
  Administered 2023-06-19: 2 [IU] via SUBCUTANEOUS
  Administered 2023-06-19: 3 [IU] via SUBCUTANEOUS
  Administered 2023-06-19: 2 [IU] via SUBCUTANEOUS
  Administered 2023-06-19 (×2): 1 [IU] via SUBCUTANEOUS
  Administered 2023-06-20: 2 [IU] via SUBCUTANEOUS
  Administered 2023-06-20: 1 [IU] via SUBCUTANEOUS

## 2023-06-19 MED ORDER — SODIUM CHLORIDE 0.9 % IV SOLN: INTRAVENOUS | Status: AC

## 2023-06-19 NOTE — Inpatient Diabetes Management (Addendum)
 Inpatient Diabetes Program Recommendations  AACE/ADA: New Consensus Statement on Inpatient Glycemic Control (2015)  Target Ranges:  Prepandial:   less than 140 mg/dL      Peak postprandial:   less than 180 mg/dL (1-2 hours)      Critically ill patients:  140 - 180 mg/dL   Lab Results  Component Value Date   GLUCAP 202 (H) 06/19/2023   HGBA1C 7.2 (H) 01/02/2023    Review of Glycemic Control  Latest Reference Range & Units 06/18/23 22:32 06/18/23 23:42 06/19/23 01:49 06/19/23 03:55 06/19/23 07:50  Glucose-Capillary 70 - 99 mg/dL 409 (H) 811 (H) 914 (H) 122 (H) 202 (H)  (H): Data is abnormally high Diabetes history: DM  Outpatient Diabetes medications:  Continue metformin 750 mg XR daily Continue Farxiga 10 mg daily Continue Ozempic 2 mg weekly Decrease Tresiba 18 units daily (note that patient has not taken insulin for 5 days) Current orders for Inpatient glycemic control:  Novolog 0-9 units q 4 hours Semglee 5 units q HS  Inpatient Diabetes Program Recommendations:    May consider increasing Semglee to 8 units bid.  Per reports, patient has not taken insulin for 5 days due to not being able to eat.  Will follow.  Thanks  Lorenza Cambridge, RN, BC-ADM Inpatient Diabetes Coordinator Pager [phone]  (8a-5p)

## 2023-06-19 NOTE — TOC CM/SW Note (Signed)
 Transition of Care [facility]) - Inpatient Brief Assessment   Patient Details  Name: Cheryl Middleton MRN: [id number] Date of Birth: 1966-01-05  Transition of Care [facility] CM/SW Contact:    Villa Herb, LCSWA Phone Number: 06/19/2023, 9:35 AM   Clinical Narrative: Transition of Care Department [facility]) has reviewed patient and no TOC needs have been identified at this time. We will continue to monitor patient advancement through interdiciplinary progression rounds. If new patient transition needs arise, please place a TOC consult.   Transition of Care Asessment: Insurance and Status: Insurance coverage has been reviewed Patient has primary care physician: Yes Home environment has been reviewed: From home Prior level of function:: Independent Prior/Current Home Services: No current home services Social Drivers of Health Review: SDOH reviewed no interventions necessary Readmission risk has been reviewed: Yes Transition of care needs: no transition of care needs at this time

## 2023-06-19 NOTE — Progress Notes (Signed)
 Progress Note   Patient: Cheryl Middleton:[id number] DOB: [birth year] DOA: 06/18/2023     1 DOS: the patient was seen and examined on 06/19/2023   Brief hospital admission narrative: As per H&P written by Dr. Mariea Clonts on 06/18/2023 Cheryl Middleton is a 58 y.o. female with medical history significant for diabetes mellitus, hypertension, CVA.  Patient presented to the ED with complaints of nausea, productive cough, myalgia of 1 week duration.  She reports she was diagnosed with COVID infection today at an urgent care-[place] [facility].  Has not been able to eat for about a week, so she has not taking her insulin for about 5 days now, or any of her oral diabetic medications for about a week now. She reports palpitations, some difficulty breathing with exertion.  No diarrhea, no abdominal pain, no urinary symptoms.   ED Course: Tachycardic to 125, Tmax 98.4.  Respiratory rate 15-20.  Blood pressure systolic 129 -153.  O2 sats greater than 96% on room air.  Blood glucose 301, anion gap of 85, serum bicarb of 15.   Lactic acid 1.8.   BHB greater than 8.   pH 7.3, pCO2 of 34.   Chest x-ray clear. EKG shows sinus tachycardia rate 116, QTc 452. Insulin drip started, 1 L bolus given.  LR at 125 cc/h started.  Assessment and Plan: * DKA (diabetic ketoacidosis) (HCC) -Blood sugars 301, anion gap of 25, serum bicarb of 15.  Beta hydroxybutyrate elevated > 8.  -In the setting of recent infection and medication noncompliance -After fluid resuscitation and insulin drip DKA process has been resolved -Patient diet has been advanced and CBGs fluctuation has been tracked, to further adjust hypoglycemic regimen -Most recent A1c 7.2 -Will continue to follow blood sugar fluctuation and hopefully discharge home in a.m. -Appreciate assistance and recommendation by diabetes coordinator.  AKI (acute kidney injury) (HCC) -Creatinine 1.33, baseline 0.6-0.8.  Likely due to DKA, dehydration from poor oral  intake. -Continue minimizing/avoiding nephrotoxic agents -Continue to maintain adequate hydration -Follow-up renal function trend  COVID-19 virus infection -Symptoms of 1 week duration.  Not hypoxic.  Chest x-ray without pneumonia.   -Out of window for intervention -Will continue supportive care and maintain adequate hydration. -Continue contact precaution.  Essential hypertension, benign -Stable -Heart healthy/low-sodium diet discussed with patient -Anticipate resumption of home antihypertensive agents at discharge.  Hypokalemia -Electrolytes have been repleted -Continue to follow trend and further replete as needed.  Subjective:  Afebrile, no chest pain, no nausea, no vomiting.  Reports poor appetite.  Physical Exam: Vitals:   06/19/23 1300 06/19/23 1400 06/19/23 1500 06/19/23 1600  BP: (!) 140/66 130/66 (!) 147/86 131/70  Pulse: (!) 105 (!) 102 (!) 101 99  Resp: 11 17 11 17   Temp:      TempSrc:      SpO2: 100% 98% 99% 98%  Weight:      Height:       General exam: Alert, awake, oriented x 3; no chest pain, no nausea, no vomiting. Respiratory system: Clear to auscultation. Respiratory effort normal.  Good saturation on room air. Cardiovascular system: Rate controlled, no rubs, no gallops, no JVD. Gastrointestinal system: Abdomen is nondistended, soft and nontender. No organomegaly or masses felt. Normal bowel sounds heard. Central nervous system: No focal neurological deficits. Extremities: No cyanosis or clubbing. Skin: No petechiae. Psychiatry: Judgement and insight appear normal. Mood & affect appropriate.   Data Reviewed: Basic metabolic panel: Sodium 139, potassium 3.5, chloride 105, bicarb 25, BUN 20, creatinine  0.77 and GFR >60 CBC: WBC 7.6, hemoglobin 15.4 and platelet count 346K Beta hydroxybutyric acid 0.94   Family Communication:  No family at bedside.  Disposition: Status is: Inpatient Remains inpatient appropriate because: IV therapy   Planned  Discharge Destination: Home  Time spent: 50 minutes  Author: Vassie Loll, MD 06/19/2023 4:43 PM  For on call review [email or web address].

## 2023-06-20 DIAGNOSIS — E1110 Type 2 diabetes mellitus with ketoacidosis without coma: Secondary | ICD-10-CM | POA: Diagnosis not present

## 2023-06-20 LAB — CBC
HCT: 35.2 % — ABNORMAL LOW (ref 36.0–46.0)
Hemoglobin: 11.4 g/dL — ABNORMAL LOW (ref 12.0–15.0)
MCH: 29.8 pg (ref 26.0–34.0)
MCHC: 32.4 g/dL (ref 30.0–36.0)
MCV: 91.9 fL (ref 80.0–100.0)
Platelets: 253 10*3/uL (ref 150–400)
RBC: 3.83 MIL/uL — ABNORMAL LOW (ref 3.87–5.11)
RDW: 13.3 % (ref 11.5–15.5)
WBC: 7.1 10*3/uL (ref 4.0–10.5)
nRBC: 0 % (ref 0.0–0.2)

## 2023-06-20 LAB — GLUCOSE, CAPILLARY
Glucose-Capillary: 101 mg/dL — ABNORMAL HIGH (ref 70–99)
Glucose-Capillary: 135 mg/dL — ABNORMAL HIGH (ref 70–99)
Glucose-Capillary: 182 mg/dL — ABNORMAL HIGH (ref 70–99)

## 2023-06-20 LAB — BASIC METABOLIC PANEL WITH GFR
Anion gap: 6 (ref 5–15)
BUN: 14 mg/dL (ref 6–20)
CO2: 27 mmol/L (ref 22–32)
Calcium: 8 mg/dL — ABNORMAL LOW (ref 8.9–10.3)
Chloride: 104 mmol/L (ref 98–111)
Creatinine, Ser: 0.62 mg/dL (ref 0.44–1.00)
GFR, Estimated: >60 mL/min (ref >60)
Glucose, Bld: 135 mg/dL — ABNORMAL HIGH (ref 70–99)
Potassium: 3 mmol/L — ABNORMAL LOW (ref 3.5–5.1)
Sodium: 137 mmol/L (ref 135–145)

## 2023-06-20 LAB — HEMOGLOBIN AND HEMATOCRIT, BLOOD
HCT: 36.8 % (ref 36.0–46.0)
Hemoglobin: 11.8 g/dL — ABNORMAL LOW (ref 12.0–15.0)

## 2023-06-20 MED ORDER — GLUCERNA SHAKE PO LIQD: 237.0000 mL | Freq: Two times a day (BID) | ORAL | 0 refills | Status: AC

## 2023-06-20 MED ORDER — POTASSIUM CHLORIDE CRYS ER 20 MEQ PO TBCR
40.0000 meq | EXTENDED_RELEASE_TABLET | Freq: Two times a day (BID) | ORAL | Status: DC
  Administered 2023-06-20: 40 meq via ORAL
  Filled 2023-06-20: qty 2

## 2023-06-20 MED ORDER — TRESIBA FLEXTOUCH 100 UNIT/ML ~~LOC~~ SOPN: 18.0000 [IU] | PEN_INJECTOR | Freq: Every day | SUBCUTANEOUS | 0 refills | Status: DC

## 2023-06-20 MED ORDER — CETIRIZINE HCL 10 MG PO TABS: 10.0000 mg | ORAL_TABLET | Freq: Every day | ORAL | 1 refills | Status: DC

## 2023-06-20 NOTE — Inpatient Diabetes Management (Signed)
 Inpatient Diabetes Program Recommendations  AACE/ADA: New Consensus Statement on Inpatient Glycemic Control (2015)  Target Ranges:  Prepandial:   less than 140 mg/dL      Peak postprandial:   less than 180 mg/dL (1-2 hours)      Critically ill patients:  140 - 180 mg/dL   Lab Results  Component Value Date   GLUCAP 101 (H) 06/20/2023   HGBA1C 7.2 (H) 01/02/2023    Review of Glycemic Control  Latest Reference Range & Units 06/19/23 16:45 06/19/23 19:44 06/20/23 00:04 06/20/23 04:13 06/20/23 07:51  Glucose-Capillary 70 - 99 mg/dL 409 (H) 811 (H) 914 (H) 135 (H) 101 (H)   Diabetes history: DM  Outpatient Diabetes medications:  Metformin 750  mg daily Ozempic 2 mg daily Farxiga 10 mg daily Current orders for Inpatient glycemic control:  Semglee 8 units bid Novolog 0-9 units q 4 hours  Inpatient Diabetes Program Recommendations:    Spoke to patient regarding diabetes management.  Also discussed DM management and sick day rules with patient.   Attached sick day rules to discharge summary. Patient states she has no further questions at this time.   Thanks,  Lorenza Cambridge, RN, BC-ADM Inpatient Diabetes Coordinator Pager [phone]  (8a-5p)

## 2023-06-20 NOTE — Discharge Summary (Signed)
 Physician Discharge Summary  Cheryl Middleton:[id number] DOB: 08-24-1965 DOA: 06/18/2023  PCP: Babs Sciara, MD  Admit date: 06/18/2023  Discharge date: 06/20/2023  Admitted From:Home  Disposition:  Home  Recommendations for Outpatient Follow-up:  Follow up with PCP in 1-2 weeks Continue on home medications as prior with Tresiba dose decreased to 18 units for now given that she has been off insulin for several days, may adjusted back to 22 units daily pending further blood glucose checks  Home Health: None  Equipment/Devices: None  Discharge Condition:Stable  CODE STATUS: Full  Diet recommendation: Heart Healthy/carb modified  Brief/Interim Summary: Cheryl Middleton is a 58 y.o. female with medical history significant for diabetes mellitus, hypertension, CVA.  Patient presented to the ED with complaints of nausea, productive cough, myalgia of 1 week duration.  She reports she was diagnosed with COVID infection today at an urgent care-[facility].  Has not been able to eat for about a week, so she has not taking her insulin for about 5 days now, or any of her oral diabetic medications for about a week now. She reports palpitations, some difficulty breathing with exertion.  No diarrhea, no abdominal pain, no urinary symptoms.  Patient was admitted with DKA and associated AKI in the setting of COVID-19 viral infection.  She received IV fluid as well as insulin drip and was converted back to sliding scale insulin as well as diet and is now tolerating this well.  AKI has resolved.  She has been recommended to decrease her Tresiba dose to 18 units for now and readjusted back at a higher dose at home as tolerated.  No other acute events or concerns noted and she is now stable condition for discharge.  Discharge Diagnoses:  Principal Problem:   DKA (diabetic ketoacidosis) (HCC) Active Problems:   AKI (acute kidney injury) (HCC)   COVID-19 virus infection   Essential  hypertension, benign   Diabetes mellitus (HCC)  Principal discharge diagnosis: DKA in the setting of COVID-19 infection along with associated AKI.  Discharge Instructions  Discharge Instructions     Diet - low sodium heart healthy   Complete by: As directed    Increase activity slowly   Complete by: As directed       Allergies as of 06/20/2023       Reactions   Ciprofloxacin Anaphylaxis   Sulfonamide Derivatives Shortness Of Breath   Ibuprofen Rash   With higher doses**   Penicillins Rash   Immediate rash, facial/tongue/throat swelling, SOB or lightheadedness with hypotension Over 20 years        Medication List     TAKE these medications    ALKA-SELTZER PLUS COLD & FLU PO Take 1 tablet by mouth daily as needed (cough/cold/congestion).   aspirin 81 MG chewable tablet Chew 81 mg by mouth in the morning.   cetirizine 10 MG tablet Commonly known as: ZYRTEC Take 1 tablet (10 mg total) by mouth daily.   CORICIDIN HBP COLD/FLU PO Take 1 tablet by mouth daily as needed (cough/cold).   dapagliflozin propanediol 10 MG Tabs tablet Commonly known as: Farxiga Take 1 tablet (10 mg total) by mouth daily before breakfast.   feeding supplement (GLUCERNA SHAKE) Liqd Take 237 mLs by mouth 2 (two) times daily between meals.   losartan 25 MG tablet Commonly known as: COZAAR Take 1 tablet (25 mg total) by mouth daily.   metFORMIN 750 MG 24 hr tablet Commonly known as: GLUCOPHAGE-XR Take 1 tablet (750 mg total)  by mouth daily with breakfast.   ondansetron 4 MG disintegrating tablet Commonly known as: ZOFRAN-ODT Take 4 mg by mouth every 8 (eight) hours as needed for nausea or vomiting.   OneTouch Verio test strip Generic drug: glucose blood Check blood sugar 2 times daily   rosuvastatin 20 MG tablet Commonly known as: CRESTOR Take 1 tablet (20 mg total) by mouth daily.   Semaglutide (2 MG/DOSE) 8 MG/3ML Sopn Inject 2 mg as directed once a week. What changed: when  to take this   [place] FlexTouch 100 UNIT/ML FlexTouch Pen Generic drug: insulin degludec Inject 18 Units into the skin daily. What changed: how much to take        Follow-up Information     Luking, Jonna Coup, MD. Schedule an appointment as soon as possible for a visit in 1 week(s).   Specialty: Family Medicine Contact information: [address] [place] [address] [phone]                Allergies  Allergen Reactions   Ciprofloxacin Anaphylaxis   Sulfonamide Derivatives Shortness Of Breath   Ibuprofen Rash    With higher doses**   Penicillins Rash    Immediate rash, facial/tongue/throat swelling, SOB or lightheadedness with hypotension Over 20 years    Consultations: None   Procedures/Studies: DG Chest 2 View Result Date: 06/18/2023 CLINICAL DATA:  Shortness of breath.  COVID infection. EXAM: CHEST - 2 VIEW COMPARISON:  None Available. FINDINGS: The heart size and mediastinal contours are within normal limits. Both lungs are clear. No pleural effusion. IMPRESSION: No active cardiopulmonary disease. Electronically Signed   By: Danae Orleans M.D.   On: 06/18/2023 12:31     Discharge Exam: Vitals:   06/20/23 0255 06/20/23 0753  BP: 122/72 127/74  Pulse: 98 96  Resp: 20 18  Temp: 98.4 F (36.9 C) 98.3 F (36.8 C)  SpO2: 99% 99%   Vitals:   06/19/23 1800 06/19/23 1937 06/20/23 0255 06/20/23 0753  BP: 136/60 113/60 122/72 127/74  Pulse: (!) 117 (!) 107 98 96  Resp: 20 20 20 18   Temp: 97.8 F (36.6 C) 98.4 F (36.9 C) 98.4 F (36.9 C) 98.3 F (36.8 C)  TempSrc: Oral Oral Oral Oral  SpO2: 100% 99% 99% 99%  Weight:      Height:        General: Pt is alert, awake, not in acute distress Cardiovascular: RRR, S1/S2 +, no rubs, no gallops Respiratory: CTA bilaterally, no wheezing, no rhonchi Abdominal: Soft, NT, ND, bowel sounds + Extremities: no edema, no cyanosis    The results of significant diagnostics from this hospitalization  (including imaging, microbiology, ancillary and laboratory) are listed below for reference.     Microbiology: Recent Results (from the past 240 hours)  MRSA Next Gen by PCR, Nasal     Status: None   Collection Time: 06/18/23  5:58 PM   Specimen: Nasal Mucosa; Nasal Swab  Result Value Ref Range Status   MRSA by PCR Next Gen NOT DETECTED NOT DETECTED Final    Comment: (NOTE) The GeneXpert MRSA Assay (FDA approved for NASAL specimens only), is one component of a comprehensive MRSA colonization surveillance program. It is not intended to diagnose MRSA infection nor to guide or monitor treatment for MRSA infections. Test performance is not FDA approved in patients less than 47 years old. Performed at [facility], [address], [place], [place] [address]      Labs: BNP (last 3 results) No  results for input(s): "BNP" in the last 8760 hours. Basic Metabolic Panel: Recent Labs  Lab 06/18/23 1609 06/18/23 2152 06/19/23 0208 06/19/23 0518 06/20/23 0410  NA 139 137 138 139 137  K 4.4 3.7 3.3* 3.5 3.0*  CL 99 103 106 105 104  CO2 14* 19* 24 25 27   GLUCOSE 241* 149* 148* 122* 135*  BUN 39* 30* 22* 20 14  CREATININE 1.16* 0.91 0.72 0.77 0.62  CALCIUM 9.5 8.8* 8.5* 8.6* 8.0*  MG  --   --  2.1  --   --    Liver Function Tests: Recent Labs  Lab 06/18/23 1137  AST 14*  ALT 15  ALKPHOS 67  BILITOT 1.7*  PROT 9.0*  ALBUMIN 4.2   No results for input(s): "LIPASE", "AMYLASE" in the last 168 hours. No results for input(s): "AMMONIA" in the last 168 hours. CBC: Recent Labs  Lab 06/18/23 1137 06/20/23 0410 06/20/23 0927  WBC 7.6 7.1  --   NEUTROABS 5.9  --   --   HGB 15.4* 11.4* 11.8*  HCT 47.4* 35.2* 36.8  MCV 91.7 91.9  --   PLT 346 253  --    Cardiac Enzymes: No results for input(s): "CKTOTAL", "CKMB", "CKMBINDEX", "TROPONINI" in the last 168 hours. BNP: Invalid input(s): "POCBNP" CBG: Recent Labs  Lab 06/19/23 1645 06/19/23 1944 06/20/23 0004 06/20/23 0413  06/20/23 0751  GLUCAP 186* 182* 182* 135* 101*   D-Dimer No results for input(s): "DDIMER" in the last 72 hours. Hgb A1c No results for input(s): "HGBA1C" in the last 72 hours. Lipid Profile No results for input(s): "CHOL", "HDL", "LDLCALC", "TRIG", "CHOLHDL", "LDLDIRECT" in the last 72 hours. Thyroid function studies No results for input(s): "TSH", "T4TOTAL", "T3FREE", "THYROIDAB" in the last 72 hours.  Invalid input(s): "FREET3" Anemia work up No results for input(s): "VITAMINB12", "FOLATE", "FERRITIN", "TIBC", "IRON", "RETICCTPCT" in the last 72 hours. Urinalysis    Component Value Date/Time   COLORURINE YELLOW 06/18/2023 1532   APPEARANCEUR CLEAR 06/18/2023 1532   LABSPEC 1.024 06/18/2023 1532   PHURINE 5.0 06/18/2023 1532   GLUCOSEU >=500 (A) 06/18/2023 1532   GLUCOSEU NEGATIVE 07/31/2008 0000   HGBUR MODERATE (A) 06/18/2023 1532   BILIRUBINUR NEGATIVE 06/18/2023 1532   KETONESUR 80 (A) 06/18/2023 1532   PROTEINUR 100 (A) 06/18/2023 1532   UROBILINOGEN 0.2 07/31/2008 0000   NITRITE NEGATIVE 06/18/2023 1532   LEUKOCYTESUR NEGATIVE 06/18/2023 1532   Sepsis Labs Recent Labs  Lab 06/18/23 1137 06/20/23 0410  WBC 7.6 7.1   Microbiology Recent Results (from the past 240 hours)  MRSA Next Gen by PCR, Nasal     Status: None   Collection Time: 06/18/23  5:58 PM   Specimen: Nasal Mucosa; Nasal Swab  Result Value Ref Range Status   MRSA by PCR Next Gen NOT DETECTED NOT DETECTED Final    Comment: (NOTE) The GeneXpert MRSA Assay (FDA approved for NASAL specimens only), is one component of a comprehensive MRSA colonization surveillance program. It is not intended to diagnose MRSA infection nor to guide or monitor treatment for MRSA infections. Test performance is not FDA approved in patients less than 2 years old. Performed at [facility], [address], [place], [place] [address]      Time coordinating discharge: 35 minutes  SIGNED:   Erick Blinks, DO [facility] 06/20/2023, 10:06 AM  If 7PM-7AM, please contact night-coverage [email or web address]

## 2023-06-20 NOTE — Plan of Care (Signed)
  Problem: Education: Goal: Knowledge of General Education information will improve Description: Including pain rating scale, medication(s)/side effects and non-pharmacologic comfort measures Outcome: Progressing   Problem: Nutrition: Goal: Adequate nutrition will be maintained Outcome: Progressing   Problem: Education: Goal: Knowledge of risk factors and measures for prevention of condition will improve Outcome: Progressing   Problem: Nutritional: Goal: Maintenance of adequate nutrition will improve Outcome: Progressing   Problem: Metabolic: Goal: Ability to maintain appropriate glucose levels will improve Outcome: Progressing   Problem: Metabolic: Goal: Ability to maintain appropriate glucose levels will improve Outcome: Progressing   Problem: Metabolic: Goal: Ability to maintain appropriate glucose levels will improve Outcome: Progressing

## 2023-06-21 ENCOUNTER — Telehealth: Payer: Self-pay | Admitting: *Deleted

## 2023-06-21 NOTE — Transitions of Care (Post Inpatient/ED Visit) (Signed)
   06/21/2023  Name: Cheryl Middleton MRN: [id number] DOB: Jul 09, 1965  Today's TOC FU Call Status: Today's TOC FU Call Status:: Unsuccessful Call (1st Attempt) Unsuccessful Call (1st Attempt) Date: 06/21/23  Attempted to reach the patient regarding the most recent Inpatient/ED visit.  Follow Up Plan: Additional outreach attempts will be made to reach the patient to complete the Transitions of Care (Post Inpatient/ED visit) call.   Irving Shows [facility], BSN RN Care Manager/ Transition of Care [place]/ [facility] [phone]

## 2023-06-22 ENCOUNTER — Ambulatory Visit: Admitting: Family Medicine

## 2023-06-22 ENCOUNTER — Telehealth: Payer: Self-pay | Admitting: *Deleted

## 2023-06-22 ENCOUNTER — Encounter: Payer: Self-pay | Admitting: Family Medicine

## 2023-06-22 VITALS — BP 130/76 | HR 103 | Temp 97.5°F | Ht 61.0 in | Wt 126.0 lb

## 2023-06-22 DIAGNOSIS — E785 Hyperlipidemia, unspecified: Secondary | ICD-10-CM

## 2023-06-22 DIAGNOSIS — I952 Hypotension due to drugs: Secondary | ICD-10-CM | POA: Diagnosis not present

## 2023-06-22 DIAGNOSIS — E1169 Type 2 diabetes mellitus with other specified complication: Secondary | ICD-10-CM

## 2023-06-22 DIAGNOSIS — I10 Essential (primary) hypertension: Secondary | ICD-10-CM | POA: Diagnosis not present

## 2023-06-22 DIAGNOSIS — E1159 Type 2 diabetes mellitus with other circulatory complications: Secondary | ICD-10-CM | POA: Diagnosis not present

## 2023-06-22 DIAGNOSIS — Z794 Long term (current) use of insulin: Secondary | ICD-10-CM

## 2023-06-22 NOTE — Progress Notes (Signed)
   Subjective:    Patient ID: Cheryl Middleton, female    DOB: [birth year], 58 y.o.   MRN: [id number]  HPI Hospitalization follow up - covid and electrolytes Patient was hospitalized Records reviewed She had diabetic ketoacidosis more than likely difficult to detect because of her medication combinations.  She had felt bad for several days unfortunately came down with COVID had decreased p.o. intake noticed her glucoses were elevated also noticed that she was dizzy and feeling weak When she went to the ER her CO2 was low glucose mildly elevated creatinine was elevated.  She was treated for DKA.  She now has some weakness and to some degree some dizziness when she stands up  Review of Systems     Objective:   Physical Exam General-in no acute distress Eyes-no discharge Lungs-respiratory rate normal, CTA CV-no murmurs,RRR Extremities skin warm dry no edema Neuro grossly normal Behavior normal, alert Blood pressure sitting then standing significant drop Sitting 110/74 Standing 84/54       Assessment & Plan:  1. Type 2 diabetes mellitus with other circulatory complication, with long-term current use of insulin (HCC) (Primary) Check lab work Continue current measures Warning signs of DKA were discussed with patient in detail I think what through the patient office that her glucoses were not severely elevated but this can occur on GLP-1's as well as SGLT2's with DKA.  We did review over the findings of her metabolic 7 when she went to the ER.  In the future if she experiences significant weakness nausea poor p.o. intake and dizziness with activity I have encouraged her to make sure she gets checked out for safety purposes Patient states she needs continuous glucose monitor and will send message to her endocrinologist - Hemoglobin A1c - Microalbumin/Creatinine Ratio, Urine Await lab work  2. Essential hypertension, benign Hold off on losartan currently because of orthostasis May  continue Farxiga Await the findings of the lab work Patient is to see her endocrinologist within the next few weeks  - Microalbumin/Creatinine Ratio, Urine - Basic Metabolic Panel  3. Hypotension due to drugs Stop losartan currently If this persists may need to reduce her Farxiga from 10 mg to 5 mg Lab work ordered Follow-up with endocrinology within the next few weeks  4. Hyperlipidemia associated with type 2 diabetes mellitus (HCC) Continue medication check lab work - Lipid Panel

## 2023-06-22 NOTE — Transitions of Care (Post Inpatient/ED Visit) (Signed)
   06/22/2023  Name: Cheryl Middleton MRN: [id number] DOB: 02-06-1966  Today's TOC FU Call Status: Today's TOC FU Call Status:: Unsuccessful Call (2nd Attempt) Unsuccessful Call (2nd Attempt) Date: 06/22/23  Attempted to reach the patient regarding the most recent Inpatient/ED visit.  Follow Up Plan: Additional outreach attempts will be made to reach the patient to complete the Transitions of Care (Post Inpatient/ED visit) call.   Irving Shows [facility], BSN RN Care Manager/ Transition of Care [place]/ [facility] [phone]

## 2023-06-24 ENCOUNTER — Encounter: Payer: Self-pay | Admitting: Family Medicine

## 2023-06-24 LAB — BASIC METABOLIC PANEL WITH GFR
BUN/Creatinine Ratio: 18 (ref 9–23)
BUN: 13 mg/dL (ref 6–24)
CO2: 26 mmol/L (ref 20–29)
Calcium: 9.2 mg/dL (ref 8.7–10.2)
Chloride: 100 mmol/L (ref 96–106)
Creatinine, Ser: 0.73 mg/dL (ref 0.57–1.00)
Glucose: 176 mg/dL — ABNORMAL HIGH (ref 70–99)
Potassium: 4.9 mmol/L (ref 3.5–5.2)
Sodium: 142 mmol/L (ref 134–144)
eGFR: 96 mL/min/{1.73_m2} (ref >59)

## 2023-06-24 LAB — LIPID PANEL
Chol/HDL Ratio: 2.2 ratio (ref 0.0–4.4)
Cholesterol, Total: 115 mg/dL (ref 100–199)
HDL: 53 mg/dL (ref >39)
LDL Chol Calc (NIH): 37 mg/dL (ref 0–99)
Triglycerides: 147 mg/dL (ref 0–149)
VLDL Cholesterol Cal: 25 mg/dL (ref 5–40)

## 2023-06-24 LAB — MICROALBUMIN / CREATININE URINE RATIO
Creatinine, Urine: 66.3 mg/dL
Microalb/Creat Ratio: 8 mg/g{creat} (ref 0–29)
Microalbumin, Urine: 5.6 ug/mL

## 2023-06-24 LAB — HEMOGLOBIN A1C
Est. average glucose Bld gHb Est-mCnc: 194 mg/dL
Hgb A1c MFr Bld: 8.4 % — ABNORMAL HIGH (ref 4.8–5.6)

## 2023-06-25 ENCOUNTER — Telehealth: Payer: Self-pay | Admitting: *Deleted

## 2023-06-25 NOTE — Transitions of Care (Post Inpatient/ED Visit) (Signed)
   06/25/2023  Name: Cheryl Middleton MRN: [id number] DOB: 21-Jul-1965  Today's TOC FU Call Status: Today's TOC FU Call Status:: Unsuccessful Call (3rd Attempt) Unsuccessful Call (3rd Attempt) Date: 06/25/23  Attempted to reach the patient regarding the most recent Inpatient/ED visit.  Follow Up Plan: No further outreach attempts will be made at this time. We have been unable to contact the patient.  Irving Shows [facility], BSN RN Care Manager/ Transition of Care [place]/ [facility] [phone]

## 2023-06-29 ENCOUNTER — Ambulatory Visit: Admitting: Internal Medicine

## 2023-06-29 ENCOUNTER — Encounter: Payer: Self-pay | Admitting: Internal Medicine

## 2023-06-29 VITALS — BP 118/68 | HR 100 | Ht 61.0 in | Wt 134.0 lb

## 2023-06-29 DIAGNOSIS — E1129 Type 2 diabetes mellitus with other diabetic kidney complication: Secondary | ICD-10-CM

## 2023-06-29 DIAGNOSIS — R809 Proteinuria, unspecified: Secondary | ICD-10-CM

## 2023-06-29 DIAGNOSIS — E1159 Type 2 diabetes mellitus with other circulatory complications: Secondary | ICD-10-CM | POA: Diagnosis not present

## 2023-06-29 DIAGNOSIS — Z794 Long term (current) use of insulin: Secondary | ICD-10-CM | POA: Diagnosis not present

## 2023-06-29 MED ORDER — DEXCOM G7 SENSOR MISC: 1.0000 | 3 refills | Status: DC

## 2023-06-29 MED ORDER — METFORMIN HCL ER 750 MG PO TB24: 750.0000 mg | ORAL_TABLET | Freq: Every day | ORAL | 3 refills | Status: DC

## 2023-06-29 MED ORDER — GLIPIZIDE 5 MG PO TABS: 5.0000 mg | ORAL_TABLET | Freq: Every day | ORAL | 3 refills | Status: DC

## 2023-06-29 MED ORDER — SEMAGLUTIDE (2 MG/DOSE) 8 MG/3ML ~~LOC~~ SOPN: 2.0000 mg | PEN_INJECTOR | SUBCUTANEOUS | 3 refills | Status: DC

## 2023-06-29 NOTE — Patient Instructions (Addendum)
 Stop [employer] Glipizide 5 mg, 1 tablet before first meal of the day  Continue Metformin 750 mg XR, 1 tablet daily  Continue Ozempic 2 mg ONCE weekly  Decrease Tresiba 16 units once daily      HOW TO TREAT LOW BLOOD SUGARS (Blood sugar LESS THAN 70 MG/DL) Please follow the RULE OF 15 for the treatment of hypoglycemia treatment (when your (blood sugars are less than 70 mg/dL)   STEP 1: Take 15 grams of carbohydrates when your blood sugar is low, which includes:  3-4 GLUCOSE TABS  OR 3-4 OZ OF JUICE OR REGULAR SODA OR ONE TUBE OF GLUCOSE GEL    STEP 2: RECHECK blood sugar in 15 MINUTES STEP 3: If your blood sugar is still low at the 15 minute recheck --> then, go back to STEP 1 and treat AGAIN with another 15 grams of carbohydrates.

## 2023-06-29 NOTE — Progress Notes (Signed)
 Name: Cheryl Middleton  Age/ Sex: 58 y.o., female   MRN/ DOB: [id number], 1966/01/29     PCP: Babs Sciara, MD   Reason for Endocrinology Evaluation: Type 2 Diabetes Mellitus  Initial Endocrine Consultative Visit: 06/17/2020    PATIENT IDENTIFIER: Cheryl Middleton is a 58 y.o. female with a past medical history of DM, HTN , CVA . The patient has followed with Endocrinology clinic since 06/17/2020 for consultative assistance with management of her diabetes.  DIABETIC HISTORY:  Cheryl Middleton was diagnosed with DM 1987. Started insulin therapy  2021. Her hemoglobin A1c has ranged from 8.0% in 2022, peaking at 13.0% in 2019.  Patient has followed by Dr. Everardo All from 2022 until March 2023   Farxiga discontinued 06/2023 with admission for DKA  SUBJECTIVE:   During the last visit (07/19/2022): A1c 7.2%     Today (06/29/2023): Cheryl Middleton is here for a follow up on diabetes management.  She checks her blood sugars 1x daily . The patient has  had hypoglycemic episodes since the last clinic visit, which typically occur in the morning    Since her last visit here she was admitted 06/18/2023 with DKA with beta hydroxybutyrate > 8 mmol/L, this was triggered by COVID infection , she did not take her glycemic agents due to feeling poorly for ~ a few days  Her losartan is on hold due to orthostatic hypotension.  Denies nausea or  vomiting since discharge  No constipation or diarrhea since hospitalized   HOME DIABETES REGIMEN:  Metformin 750 mg daily  Farxiga 10 mg daily  Ozempic 2 mg weekly  Tresiba  18 units    Statin: yes ACE-I/ARB: yes   METER DOWNLOAD SUMMARY: 3/13-4/01/2024 Fingerstick Blood Glucose Tests = 22 Average Number Tests/Day = 1 Overall Mean FS Glucose = 161 Standard Deviation = 44  BG Ranges: Low = 99 High = 281  BG Target % Results: % In target = 77 % Over target = 23 % Under target = 0  Hypoglycemic Events/30 Days: BG < 50 = 0 Episodes of symptomatic  severe hypoglycemia = 0   DIABETIC COMPLICATIONS: Microvascular complications:   Denies: CKD  Last Eye Exam: Completed 03/2021  Macrovascular complications:  CVA Denies: CAD, PVD   HISTORY:  Past Medical History:  Past Medical History:  Diagnosis Date   Cancer (HCC)    tongue   Diabetes mellitus, type II (HCC)    Hyperlipidemia    Hypertension    Malignant neoplasm of tongue (HCC)    Tongue   Stroke [facility])    Past Surgical History:  Past Surgical History:  Procedure Laterality Date   BREAST EXCISIONAL BIOPSY  12/1989   CESAREAN SECTION  01/2003   COLONOSCOPY N/A 04/20/2017   Procedure: COLONOSCOPY;  Surgeon: West Bali, MD;  [facility];  Service: Endoscopy;  Laterality: N/A;  12:00   COLONOSCOPY WITH PROPOFOL N/A 08/15/2022   Procedure: COLONOSCOPY WITH PROPOFOL;  Surgeon: Lanelle Bal, DO;  [facility];  Service: Endoscopy;  Laterality: N/A;  12:15pm, asa 3   TONGUE SURGERY  10/19/1999   tongue cancer   TRACHEOSTOMY     Social History:  reports that she has never smoked. She has never used smokeless tobacco. She reports that she does not drink alcohol and does not use drugs. Family History:  Family History  Problem Relation Age of Onset   Hypertension Mother    Diabetes Mother    Kidney disease Mother  Hypertension Father    Diabetes Father    Congestive Heart Failure Father    COPD Father    Hypertension Sister    Diabetes Sister    Diabetes Sister        borderline   ADD / ADHD Son    Eczema Son      HOME MEDICATIONS: Allergies as of 06/29/2023       Reactions   Ciprofloxacin Anaphylaxis   Sulfonamide Derivatives Shortness Of Breath   Ibuprofen Rash   With higher doses**   Penicillins Rash   Immediate rash, facial/tongue/throat swelling, SOB or lightheadedness with hypotension Over 20 years        Medication List        Accurate as of June 29, 2023  3:07 PM. If you have any questions, ask your nurse  or doctor.          ALKA-SELTZER PLUS COLD & FLU PO Take 1 tablet by mouth daily as needed (cough/cold/congestion).   aspirin 81 MG chewable tablet Chew 81 mg by mouth in the morning.   cetirizine 10 MG tablet Commonly known as: ZYRTEC Take 1 tablet (10 mg total) by mouth daily.   CORICIDIN HBP COLD/FLU PO Take 1 tablet by mouth daily as needed (cough/cold).   dapagliflozin propanediol 10 MG Tabs tablet Commonly known as: Farxiga Take 1 tablet (10 mg total) by mouth daily before breakfast.   feeding supplement (GLUCERNA SHAKE) Liqd Take 237 mLs by mouth 2 (two) times daily between meals.   losartan 25 MG tablet Commonly known as: COZAAR Take 1 tablet (25 mg total) by mouth daily.   metFORMIN 750 MG 24 hr tablet Commonly known as: GLUCOPHAGE-XR Take 1 tablet (750 mg total) by mouth daily with breakfast.   ondansetron 4 MG disintegrating tablet Commonly known as: ZOFRAN-ODT Take 4 mg by mouth every 8 (eight) hours as needed for nausea or vomiting.   OneTouch Verio test strip Generic drug: glucose blood Check blood sugar 2 times daily   rosuvastatin 20 MG tablet Commonly known as: CRESTOR Take 1 tablet (20 mg total) by mouth daily.   Semaglutide (2 MG/DOSE) 8 MG/3ML Sopn Inject 2 mg as directed once a week. What changed: when to take this   [place] FlexTouch 100 UNIT/ML FlexTouch Pen Generic drug: insulin degludec Inject 18 Units into the skin daily.         OBJECTIVE:   Vital Signs: BP 118/68 (BP Location: Right Arm, Patient Position: Standing)   Pulse 100   Ht 5\' 1"  (1.549 m)   Wt 134 lb (60.8 kg)   LMP 07/22/2016   SpO2 96%   BMI 25.32 kg/m   Wt Readings from Last 3 Encounters:  06/29/23 134 lb (60.8 kg)  06/22/23 126 lb (57.2 kg)  06/18/23 117 lb 8.1 oz (53.3 kg)     Exam: General: Cheryl Middleton appears well and is in NAD  Lungs: Clear with good BS bilat   Heart: RRR   Extremities: No pretibial edema.   Neuro: MS is good with appropriate affect,  Cheryl Middleton is alert and Ox3    DM foot exam: 01/23/2023  The skin of the feet is intact without sores or ulcerations. The pedal pulses are 2+ on right and 2+ on left. The sensation is intact  to a screening 5.07, 10 gram monofilament bilaterally        DATA REVIEWED:  Lab Results  Component Value Date   HGBA1C 8.4 (H) 06/22/2023   HGBA1C 7.2 (H) 01/02/2023  HGBA1C 7.2 (A) 07/19/2022    Latest Reference Range & Units 06/22/23 12:13  Sodium 134 - 144 mmol/L 142  Potassium 3.5 - 5.2 mmol/L 4.9  Chloride 96 - 106 mmol/L 100  CO2 20 - 29 mmol/L 26  Glucose 70 - 99 mg/dL 161 (H)  BUN 6 - 24 mg/dL 13  Creatinine 0.96 - 0.45 mg/dL 4.09  Calcium 8.7 - 81.1 mg/dL 9.2  BUN/Creatinine Ratio 9 - 23  18  eGFR >59 mL/min/1.73 96    Latest Reference Range & Units 06/22/23 12:13  Total CHOL/HDL Ratio 0.0 - 4.4 ratio 2.2  Cholesterol, Total 100 - 199 mg/dL 914  HDL Cholesterol >78 mg/dL 53  MICROALB/CREAT RATIO 0 - 29 mg/g creat 8  Triglycerides 0 - 149 mg/dL 295  VLDL Cholesterol Cal 5 - 40 mg/dL 25  LDL Chol Calc (NIH) 0 - 99 mg/dL 37    Latest Reference Range & Units 06/22/23 12:13  Glucose 70 - 99 mg/dL 621 (H)  Hemoglobin H0Q 4.8 - 5.6 % 8.4 (H)  Est. average glucose Bld gHb Est-mCnc mg/dL 657    Latest Reference Range & Units 06/22/23 12:13  Microalbumin, Urine Not Estab. ug/mL 5.6  MICROALB/CREAT RATIO 0 - 29 mg/g creat 8  Creatinine, Urine Not Estab. mg/dL 84.6   Old records , labs and images have been reviewed.     ASSESSMENT / PLAN / RECOMMENDATIONS:   1) Type 2 Diabetes Mellitus, Poorly controlled, With microlabuminuria and macrovascular  complications - Most recent A1c of 8.4%. Goal A1c < 7.0 %.    -A1c has been above goal -In reviewing glucose meter download, the patient has been noted with postprandial hyperglycemia -Will discontinue Marcelline Deist due to DKA -I have recommended starting glipizide once daily before the first meal of the day, caution against hypoglycemia,  will decrease Evaristo Bury as below -Emphasized the importance of taking glipizide 15-20 minutes before the first meal of the day -A prescription for Dexcom G7 has been sent to the pharmacy   MEDICATIONS: Stop Marcelline Deist Start glipizide 5 mg, 1 tablet before breakfast Continue metformin 750 mg XR daily Continue Ozempic 2 mg weekly Decrease Tresiba 16 units daily  EDUCATION / INSTRUCTIONS: BG monitoring instructions: Patient is instructed to check her blood sugars 3 times a day, before meals. Call [place] [facility] if: BG persistently < 70  I reviewed the Rule of 15 for the treatment of hypoglycemia in detail with the patient. Literature supplied.    2) Diabetic complications:  Eye: Does not have known diabetic retinopathy.  Neuro/ Feet: Does not have known diabetic peripheral neuropathy Renal: Patient does not have known baseline CKD. She  is  on an ACEI/ARB at present.    3) Microalbuminuria:  -This has been intermittently elevated since 2019.  Last checkup was 06/2023 which shows normalization -Losartan is on hold due to dizziness and orthostatic hypotension.  Dizziness has resolved, but she continues with orthostatic hypotension -I am hoping with discontinuing Marcelline Deist, her symptoms will improve and we will consider restarting losartan   F/U in 3 months     Signed electronically by: Lyndle Herrlich, MD  [facility] Endocrinology  [facility] [address] [place], [place] [address] Phone: [phone] FAX: [phone]   CC: Babs Sciara, MD [address] [place] [address] Phone: [phone]  Fax: [phone]  Return to Endocrinology clinic as below: No future appointments.

## 2023-07-03 ENCOUNTER — Telehealth: Payer: Self-pay | Admitting: Pharmacy Technician

## 2023-07-03 ENCOUNTER — Other Ambulatory Visit (HOSPITAL_COMMUNITY): Payer: Self-pay

## 2023-07-03 NOTE — Telephone Encounter (Signed)
 Pharmacy Patient Advocate Encounter  Received notification from [facility] that Prior Authorization for Dexcom G7 Sensor has been APPROVED from 07-03-2023 to 07-02-2024   PA #/Case ID/Reference #: [id number]

## 2023-07-03 NOTE — Telephone Encounter (Signed)
 Pharmacy Patient Advocate Encounter   Received notification from CoverMyMeds that prior authorization for Dexcom G7 Sensor is required/requested.   Insurance verification completed.   The patient is insured through [facility] .   Per test claim: PA required; PA submitted to above mentioned insurance via CoverMyMeds Key/confirmation #/EOC [id number] Status is pending

## 2023-07-17 ENCOUNTER — Other Ambulatory Visit: Payer: Self-pay | Admitting: Family Medicine

## 2023-07-19 ENCOUNTER — Other Ambulatory Visit: Payer: Self-pay | Admitting: Family Medicine

## 2023-07-20 ENCOUNTER — Other Ambulatory Visit: Payer: Self-pay

## 2023-07-20 MED ORDER — LOSARTAN POTASSIUM 25 MG PO TABS
25.0000 mg | ORAL_TABLET | Freq: Every day | ORAL | 2 refills | Status: DC
Start: 2023-07-20 — End: 2023-09-28

## 2023-07-24 ENCOUNTER — Ambulatory Visit: Payer: BC Managed Care – PPO | Admitting: Internal Medicine

## 2023-09-28 ENCOUNTER — Other Ambulatory Visit: Payer: Self-pay | Admitting: Internal Medicine

## 2023-09-28 ENCOUNTER — Encounter: Payer: Self-pay | Admitting: Internal Medicine

## 2023-09-28 ENCOUNTER — Ambulatory Visit: Admitting: Internal Medicine

## 2023-09-28 VITALS — BP 120/70 | HR 103 | Ht 61.0 in | Wt 137.2 lb

## 2023-09-28 DIAGNOSIS — E1165 Type 2 diabetes mellitus with hyperglycemia: Secondary | ICD-10-CM

## 2023-09-28 DIAGNOSIS — E1159 Type 2 diabetes mellitus with other circulatory complications: Secondary | ICD-10-CM

## 2023-09-28 DIAGNOSIS — Z794 Long term (current) use of insulin: Secondary | ICD-10-CM

## 2023-09-28 LAB — POCT GLYCOSYLATED HEMOGLOBIN (HGB A1C): Hemoglobin A1C: 8.4 % — AB (ref 4.0–5.6)

## 2023-09-28 MED ORDER — GLIPIZIDE 5 MG PO TABS: 5.0000 mg | ORAL_TABLET | Freq: Two times a day (BID) | ORAL | 3 refills | Status: DC

## 2023-09-28 MED ORDER — TRESIBA FLEXTOUCH 100 UNIT/ML ~~LOC~~ SOPN: 14.0000 [IU] | PEN_INJECTOR | Freq: Every day | SUBCUTANEOUS | 3 refills | Status: DC

## 2023-09-28 MED ORDER — METFORMIN HCL ER 750 MG PO TB24: 750.0000 mg | ORAL_TABLET | Freq: Every day | ORAL | 3 refills | Status: DC

## 2023-09-28 MED ORDER — SEMAGLUTIDE (2 MG/DOSE) 8 MG/3ML ~~LOC~~ SOPN: 2.0000 mg | PEN_INJECTOR | SUBCUTANEOUS | 3 refills | Status: DC

## 2023-09-28 NOTE — Patient Instructions (Signed)
 Take glipizide  5 mg, 1 tablet before first meal of the day and 1 tablet before last meal of the day Continue Metformin  750 mg XR, 1 tablet daily  Continue Ozempic  2 mg ONCE weekly  Decrease Tresiba  14 units once daily      HOW TO TREAT LOW BLOOD SUGARS (Blood sugar LESS THAN 70 MG/DL) Please follow the RULE OF 15 for the treatment of hypoglycemia treatment (when your (blood sugars are less than 70 mg/dL)   STEP 1: Take 15 grams of carbohydrates when your blood sugar is low, which includes:  3-4 GLUCOSE TABS  OR 3-4 OZ OF JUICE OR REGULAR SODA OR ONE TUBE OF GLUCOSE GEL    STEP 2: RECHECK blood sugar in 15 MINUTES STEP 3: If your blood sugar is still low at the 15 minute recheck --> then, go back to STEP 1 and treat AGAIN with another 15 grams of carbohydrates.

## 2023-09-28 NOTE — Progress Notes (Signed)
 Name: Cheryl Middleton  Age/ Sex: 58 y.o., female   MRN/ DOB: [id number], 1966-03-06     PCP: Alphonsa Glendia LABOR, MD   Reason for Endocrinology Evaluation: Type 2 Diabetes Mellitus  Initial Endocrine Consultative Visit: 06/17/2020    PATIENT IDENTIFIER: Cheryl Middleton is a 58 y.o. female with a past medical history of DM, HTN , CVA . The patient has followed with Endocrinology clinic since 06/17/2020 for consultative assistance with management of her diabetes.  DIABETIC HISTORY:  Cheryl Middleton was diagnosed with DM 1987. Started insulin  therapy  2021. Her hemoglobin A1c has ranged from 8.0% in 2022, peaking at 13.0% in 2019.  Patient has followed by Dr. Kassie from 2022 until March 2023   Farxiga  discontinued 06/2023 with admission for DKA and dizziness  Started glipizide  06/2023  SUBJECTIVE:   During the last visit (06/29/2023): A1c 8.4%     Today (09/28/2023): Cheryl Middleton is here for a follow up on diabetes management.  She checks her blood sugars multiple x daily , through the Dexcom.  The patient has had hypoglycemic episodes since the last clinic visit, which typically occur in the morning   Denies nausea or vomiting  Denies constipation or diarrhea  Dizziness has resolved     HOME DIABETES REGIMEN:  Metformin  750 mg daily  Glipizide  5 mg, 1 tablet before breakfast Ozempic  2 mg weekly  Tresiba   16 units    Statin: yes ACE-I/ARB: yes  CONTINUOUS GLUCOSE MONITORING RECORD INTERPRETATION    Dates of Recording: 6/28 - 09/28/2023  Sensor description:dexcom  Results statistics:   CGM use % of time 92  Average and SD 187/62  Time in range      49%  % Time Above 180 36  % Time above 250 15  % Time Below target 0   Glycemic patterns summary: BG's trend down overnight and fluctuate during the day  Hyperglycemic episodes postprandial but worse with supper  Hypoglycemic episodes occurred at variable times  Overnight periods: Mostly optimal    DIABETIC  COMPLICATIONS: Microvascular complications:   Denies: CKD  Last Eye Exam: Completed 03/2021  Macrovascular complications:  CVA Denies: CAD, PVD   HISTORY:  Past Medical History:  Past Medical History:  Diagnosis Date   Cancer (HCC)    tongue   Diabetes mellitus, type II (HCC)    Hyperlipidemia    Hypertension    Malignant neoplasm of tongue (HCC)    Tongue   Stroke [facility])    Past Surgical History:  Past Surgical History:  Procedure Laterality Date   BREAST EXCISIONAL BIOPSY  12/1989   CESAREAN SECTION  01/2003   COLONOSCOPY N/A 04/20/2017   Procedure: COLONOSCOPY;  Surgeon: Harvey Margo LITTIE, MD;  [facility];  Service: Endoscopy;  Laterality: N/A;  12:00   COLONOSCOPY WITH PROPOFOL  N/A 08/15/2022   Procedure: COLONOSCOPY WITH PROPOFOL ;  Surgeon: Cindie Carlin POUR, DO;  [facility];  Service: Endoscopy;  Laterality: N/A;  12:15pm, asa 3   TONGUE SURGERY  10/19/1999   tongue cancer   TRACHEOSTOMY     Social History:  reports that she has never smoked. She has never used smokeless tobacco. She reports that she does not drink alcohol and does not use drugs. Family History:  Family History  Problem Relation Age of Onset   Hypertension Mother    Diabetes Mother    Kidney disease Mother    Hypertension Father    Diabetes Father    Congestive Heart  Failure Father    COPD Father    Hypertension Sister    Diabetes Sister    Diabetes Sister        borderline   ADD / ADHD Son    Eczema Son      HOME MEDICATIONS: Allergies as of 09/28/2023       Reactions   Ciprofloxacin Anaphylaxis   Sulfonamide Derivatives Shortness Of Breath   Ibuprofen Rash   With higher doses**   Penicillins Rash   Immediate rash, facial/tongue/throat swelling, SOB or lightheadedness with hypotension Over 20 years        Medication List        Accurate as of September 28, 2023  2:32 PM. If you have any questions, ask your nurse or doctor.          ALKA-SELTZER  PLUS COLD & FLU PO Take 1 tablet by mouth daily as needed (cough/cold/congestion).   aspirin  81 MG chewable tablet Chew 81 mg by mouth in the morning.   cetirizine  10 MG tablet Commonly known as: ZYRTEC  Take 1 tablet (10 mg total) by mouth daily.   CORICIDIN HBP COLD/FLU PO Take 1 tablet by mouth daily as needed (cough/cold).   Dexcom G7 Sensor Misc 1 Device by Does not apply route as directed.   feeding supplement (GLUCERNA SHAKE) Liqd Take 237 mLs by mouth 2 (two) times daily between meals.   glipiZIDE  5 MG tablet Commonly known as: GLUCOTROL  Take 1 tablet (5 mg total) by mouth daily before breakfast.   losartan  25 MG tablet Commonly known as: COZAAR  Take 1 tablet (25 mg total) by mouth daily.   metFORMIN  750 MG 24 hr tablet Commonly known as: GLUCOPHAGE -XR Take 1 tablet (750 mg total) by mouth daily with breakfast.   ondansetron 4 MG disintegrating tablet Commonly known as: ZOFRAN-ODT Take 4 mg by mouth every 8 (eight) hours as needed for nausea or vomiting.   OneTouch Verio test strip Generic drug: glucose blood Check blood sugar 2 times daily   rosuvastatin  20 MG tablet Commonly known as: CRESTOR  TAKE 1 TABLET BY MOUTH EVERY DAY   Semaglutide  (2 MG/DOSE) 8 MG/3ML Sopn Inject 2 mg as directed once a week.   Tresiba  FlexTouch 100 UNIT/ML FlexTouch Pen Generic drug: insulin  degludec Inject 18 Units into the skin daily. What changed: how much to take         OBJECTIVE:   Vital Signs: BP 120/70 (BP Location: Left Arm, Patient Position: Sitting, Cuff Size: Normal)   Pulse (!) 103   Ht 5' 1 (1.549 m)   Wt 137 lb 3.2 oz (62.2 kg)   LMP 07/22/2016   SpO2 98%   BMI 25.92 kg/m   Wt Readings from Last 3 Encounters:  09/28/23 137 lb 3.2 oz (62.2 kg)  06/29/23 134 lb (60.8 kg)  06/22/23 126 lb (57.2 kg)     Exam: General: Pt appears well and is in NAD  Lungs: Clear with good BS bilat   Heart: RRR   Extremities: No pretibial edema.   Neuro: MS is  good with appropriate affect, pt is alert and Ox3    DM foot exam: 01/23/2023  The skin of the feet is intact without sores or ulcerations. The pedal pulses are 2+ on right and 2+ on left. The sensation is intact  to a screening 5.07, 10 gram monofilament bilaterally        DATA REVIEWED:  Lab Results  Component Value Date   HGBA1C 8.4 (A) 09/28/2023  HGBA1C 8.4 (H) 06/22/2023   HGBA1C 7.2 (H) 01/02/2023    Latest Reference Range & Units 06/22/23 12:13  Sodium 134 - 144 mmol/L 142  Potassium 3.5 - 5.2 mmol/L 4.9  Chloride 96 - 106 mmol/L 100  CO2 20 - 29 mmol/L 26  Glucose 70 - 99 mg/dL 823 (H)  BUN 6 - 24 mg/dL 13  Creatinine 9.42 - 8.99 mg/dL 9.26  Calcium  8.7 - 10.2 mg/dL 9.2  BUN/Creatinine Ratio 9 - 23  18  eGFR >59 mL/min/1.73 96    Latest Reference Range & Units 06/22/23 12:13  Total CHOL/HDL Ratio 0.0 - 4.4 ratio 2.2  Cholesterol, Total 100 - 199 mg/dL 884  HDL Cholesterol >60 mg/dL 53  MICROALB/CREAT RATIO 0 - 29 mg/g creat 8  Triglycerides 0 - 149 mg/dL 852  VLDL Cholesterol Cal 5 - 40 mg/dL 25  LDL Chol Calc (NIH) 0 - 99 mg/dL 37    Latest Reference Range & Units 06/22/23 12:13  Glucose 70 - 99 mg/dL 823 (H)  Hemoglobin J8R 4.8 - 5.6 % 8.4 (H)  Est. average glucose Bld gHb Est-mCnc mg/dL 805    Latest Reference Range & Units 06/22/23 12:13  Microalbumin, Urine Not Estab. ug/mL 5.6  MICROALB/CREAT RATIO 0 - 29 mg/g creat 8  Creatinine, Urine Not Estab. mg/dL 33.6   Old records , labs and images have been reviewed.     ASSESSMENT / PLAN / RECOMMENDATIONS:   1) Type 2 Diabetes Mellitus, Poorly controlled, With hx microlabuminuria and macrovascular  complications - Most recent A1c of 8.4%. Goal A1c < 7.0 %.    -A1c has been above goal -In reviewing glucose meter download, the patient has been noted with postprandial hyperglycemia -We discontinued  Farxiga  due to DKA - Will increase glipizide  as below - Will decrease Tresiba  as below due to  hypoglycemia  MEDICATIONS:  Increase glipizide  5 mg, 1 tablet before breakfast and 1 tablet before supper Continue metformin  750 mg XR daily Continue Ozempic  2 mg weekly Decrease Tresiba  14 units daily  EDUCATION / INSTRUCTIONS: BG monitoring instructions: Patient is instructed to check her blood sugars 3 times a day, before meals. Call [facility] if: BG persistently < 70  I reviewed the Rule of 15 for the treatment of hypoglycemia in detail with the patient. Literature supplied.    2) Diabetic complications:  Eye: Does not have known diabetic retinopathy.  Neuro/ Feet: Does not have known diabetic peripheral neuropathy Renal: Patient does not have known baseline CKD. She  is  on an ACEI/ARB at present.    3) Microalbuminuria:   -This has been intermittently elevated since 2019.  Last checkup was 06/2023 which shows normalization -Losartan  was held due to dizziness and orthostatic hypotension - Dizziness has resolved - We have opted to remain off losartan  since her BP is normal and MA/CR's ratio is normal  F/U in 4 months     Signed electronically by: Stefano Redgie Butts, MD  [facility] Endocrinology  [facility] [address] [place], [place] [address] Phone: [phone] FAX: [phone]   CC: Alphonsa Glendia LABOR, MD [address] [place] [address] Phone: [phone]  Fax: [phone]  Return to Endocrinology clinic as below: No future appointments.

## 2023-10-01 ENCOUNTER — Other Ambulatory Visit: Payer: Self-pay | Admitting: Internal Medicine

## 2023-10-01 ENCOUNTER — Other Ambulatory Visit (HOSPITAL_COMMUNITY): Payer: Self-pay

## 2023-10-01 ENCOUNTER — Telehealth: Payer: Self-pay

## 2023-10-01 MED ORDER — LANTUS SOLOSTAR 100 UNIT/ML ~~LOC~~ SOPN
14.0000 [IU] | PEN_INJECTOR | Freq: Every day | SUBCUTANEOUS | 3 refills | Status: DC
Start: 2023-10-01 — End: 2024-01-30

## 2023-10-01 NOTE — Addendum Note (Signed)
 Addended by: SAM DONELL PARAS on: 10/01/2023 10:36 AM   Modules accepted: Orders

## 2023-10-01 NOTE — Telephone Encounter (Signed)
 Pharmacy Patient Advocate Encounter   Received notification from RX Request Messages that prior authorization for TRESIBA  FLEXTOUCH 100 UNIT/ML FlexTouch Pen  is required/requested.   Insurance verification completed.   The patient is insured through [facility] [employer]VANCE .   Per test claim:  Lantus  or Toujeo  is preferred by the insurance.  If suggested medication is appropriate, Please send in a new RX and discontinue this one. If not, please advise as to why it's not appropriate so that we may request a Prior Authorization. Please note, some preferred medications may still require a PA.  If the suggested medications have not been trialed and there are no contraindications to their use, the PA will not be submitted, as it will not be approved.   Both alternatives have a copay of $0 for a 90 day supply

## 2023-12-04 ENCOUNTER — Other Ambulatory Visit: Payer: Self-pay | Admitting: Family Medicine

## 2024-01-30 ENCOUNTER — Encounter: Payer: Self-pay | Admitting: Internal Medicine

## 2024-01-30 ENCOUNTER — Ambulatory Visit: Admitting: Internal Medicine

## 2024-01-30 VITALS — BP 128/74 | Ht 61.0 in | Wt 144.0 lb

## 2024-01-30 DIAGNOSIS — Z794 Long term (current) use of insulin: Secondary | ICD-10-CM

## 2024-01-30 DIAGNOSIS — E1165 Type 2 diabetes mellitus with hyperglycemia: Secondary | ICD-10-CM

## 2024-01-30 DIAGNOSIS — E1159 Type 2 diabetes mellitus with other circulatory complications: Secondary | ICD-10-CM

## 2024-01-30 LAB — POCT GLYCOSYLATED HEMOGLOBIN (HGB A1C): Hemoglobin A1C: 9 % — AB (ref 4.0–5.6)

## 2024-01-30 MED ORDER — LANTUS SOLOSTAR 100 UNIT/ML ~~LOC~~ SOPN: 12.0000 [IU] | PEN_INJECTOR | Freq: Every day | SUBCUTANEOUS | 3 refills | Status: AC

## 2024-01-30 MED ORDER — GLIPIZIDE 10 MG PO TABS: 10.0000 mg | ORAL_TABLET | Freq: Two times a day (BID) | ORAL | 3 refills | Status: DC

## 2024-01-30 MED ORDER — SEMAGLUTIDE (2 MG/DOSE) 8 MG/3ML ~~LOC~~ SOPN: 2.0000 mg | PEN_INJECTOR | SUBCUTANEOUS | 3 refills | Status: DC

## 2024-01-30 MED ORDER — METFORMIN HCL ER 750 MG PO TB24: 750.0000 mg | ORAL_TABLET | Freq: Every day | ORAL | 3 refills | Status: DC

## 2024-01-30 NOTE — Patient Instructions (Addendum)
 Increase glipizide  10 mg, 1 tablet before first meal of the day and 1 tablet before last meal of the day Continue Metformin  750 mg XR, 1 tablet daily  Continue Ozempic  2 mg ONCE weekly  Decrease Tresiba  12 units once daily      HOW TO TREAT LOW BLOOD SUGARS (Blood sugar LESS THAN 70 MG/DL) Please follow the RULE OF 15 for the treatment of hypoglycemia treatment (when your (blood sugars are less than 70 mg/dL)   STEP 1: Take 15 grams of carbohydrates when your blood sugar is low, which includes:  3-4 GLUCOSE TABS  OR 3-4 OZ OF JUICE OR REGULAR SODA OR ONE TUBE OF GLUCOSE GEL    STEP 2: RECHECK blood sugar in 15 MINUTES STEP 3: If your blood sugar is still low at the 15 minute recheck --> then, go back to STEP 1 and treat AGAIN with another 15 grams of carbohydrates.

## 2024-01-30 NOTE — Progress Notes (Signed)
 Name: Cheryl Middleton  Age/ Sex: 58 y.o., female   MRN/ DOB: [id number], 05-03-1965     PCP: Alphonsa Glendia LABOR, MD   Reason for Endocrinology Evaluation: Type 2 Diabetes Mellitus  Initial Endocrine Consultative Visit: 06/17/2020    PATIENT IDENTIFIER: Cheryl Middleton is a 58 y.o. female with a past medical history of DM, HTN , CVA . The patient has followed with Endocrinology clinic since 06/17/2020 for consultative assistance with management of her diabetes.  DIABETIC HISTORY:  Cheryl Middleton was diagnosed with DM 1987. Started insulin  therapy  2021. Her hemoglobin A1c has ranged from 8.0% in 2022, peaking at 13.0% in 2019.  Patient has followed by Dr. Kassie from 2022 until March 2023   Farxiga  discontinued 06/2023 with admission for DKA and dizziness  Started glipizide  06/2023  SUBJECTIVE:   During the last visit (09/28/2023): A1c 8.4%     Today (01/30/2024): Cheryl Middleton is here for a follow up on diabetes management.  She checks her blood sugars multiple x daily , through the Dexcom.  The patient has had hypoglycemic episodes since the last clinic visit, she is asymptomatic.    Pt admits to dietary indiscretions but has improved over the past early October   No nausea or vomiting  Has constipation but no diarrhea   HOME DIABETES REGIMEN:  Metformin  750 mg daily  Glipizide  5 mg, 1 tablet BID Ozempic  2 mg weekly  Tresiba   14 units    Statin: yes ACE-I/ARB: yes  CONTINUOUS GLUCOSE MONITORING RECORD INTERPRETATION    Dates of Recording: 10/29-11/02/2024  Sensor description:dexcom  Results statistics:   CGM use % of time 94  Average and SD 190/62  Time in range 45%  % Time Above 180 38  % Time above 250 17  % Time Below target 0   Glycemic patterns summary: BGs are optimal overnight and fluctuate during the day Hyperglycemic episodes postprandial  Hypoglycemic episodes occurred at variable times but mostly overnight  Overnight periods: Mostly  optimal    DIABETIC COMPLICATIONS: Microvascular complications:   Denies: CKD  Last Eye Exam: Completed 10/06/2022   Macrovascular complications:  CVA Denies: CAD, PVD   HISTORY:  Past Medical History:  Past Medical History:  Diagnosis Date   Cancer (HCC)    tongue   Diabetes mellitus, type II (HCC)    Hyperlipidemia    Hypertension    Malignant neoplasm of tongue (HCC)    Tongue   Stroke [facility])    Past Surgical History:  Past Surgical History:  Procedure Laterality Date   BREAST EXCISIONAL BIOPSY  12/1989   CESAREAN SECTION  01/2003   COLONOSCOPY N/A 04/20/2017   Procedure: COLONOSCOPY;  Surgeon: Harvey Margo LITTIE, MD;  [facility];  Service: Endoscopy;  Laterality: N/A;  12:00   COLONOSCOPY WITH PROPOFOL  N/A 08/15/2022   Procedure: COLONOSCOPY WITH PROPOFOL ;  Surgeon: Cindie Carlin POUR, DO;  Location: [facility];  Service: Endoscopy;  Laterality: N/A;  12:15pm, asa 3   TONGUE SURGERY  10/19/1999   tongue cancer   TRACHEOSTOMY     Social History:  reports that she has never smoked. She has never used smokeless tobacco. She reports that she does not drink alcohol and does not use drugs. Family History:  Family History  Problem Relation Age of Onset   Hypertension Mother    Diabetes Mother    Kidney disease Mother    Hypertension Father    Diabetes Father    Congestive Heart Failure  Father    COPD Father    Hypertension Sister    Diabetes Sister    Diabetes Sister        borderline   ADD / ADHD Son    Eczema Son      HOME MEDICATIONS: Allergies as of 01/30/2024       Reactions   Ciprofloxacin Anaphylaxis   Sulfonamide Derivatives Shortness Of Breath   Ibuprofen Rash   With higher doses**   Penicillins Rash   Immediate rash, facial/tongue/throat swelling, SOB or lightheadedness with hypotension Over 20 years        Medication List        Accurate as of January 30, 2024  2:33 PM. If you have any questions, ask your nurse or  doctor.          ALKA-SELTZER PLUS COLD & FLU PO Take 1 tablet by mouth daily as needed (cough/cold/congestion).   aspirin  81 MG chewable tablet Chew 81 mg by mouth in the morning.   cetirizine  10 MG tablet Commonly known as: ZYRTEC  TAKE 1 TABLET BY MOUTH EVERY DAY   CORICIDIN HBP COLD/FLU PO Take 1 tablet by mouth daily as needed (cough/cold).   Dexcom G7 Sensor Misc 1 Device by Does not apply route as directed.   feeding supplement (GLUCERNA SHAKE) Liqd Take 237 mLs by mouth 2 (two) times daily between meals.   glipiZIDE  5 MG tablet Commonly known as: GLUCOTROL  Take 1 tablet (5 mg total) by mouth 2 (two) times daily before a meal.   Lantus  SoloStar 100 UNIT/ML Solostar Pen Generic drug: insulin  glargine Inject 14 Units into the skin daily.   metFORMIN  750 MG 24 hr tablet Commonly known as: GLUCOPHAGE -XR Take 1 tablet (750 mg total) by mouth daily with breakfast.   ondansetron 4 MG disintegrating tablet Commonly known as: ZOFRAN-ODT Take 4 mg by mouth every 8 (eight) hours as needed for nausea or vomiting.   OneTouch Verio test strip Generic drug: glucose blood Check blood sugar 2 times daily   rosuvastatin  20 MG tablet Commonly known as: CRESTOR  TAKE 1 TABLET BY MOUTH EVERY DAY   Semaglutide  (2 MG/DOSE) 8 MG/3ML Sopn Inject 2 mg as directed once a week.         OBJECTIVE:   Vital Signs: BP 128/74   Ht 5' 1 (1.549 m)   Wt 144 lb (65.3 kg)   LMP 07/22/2016   BMI 27.21 kg/m   Wt Readings from Last 3 Encounters:  01/30/24 144 lb (65.3 kg)  09/28/23 137 lb 3.2 oz (62.2 kg)  06/29/23 134 lb (60.8 kg)     Exam: General: Pt appears well and is in NAD  Lungs: Clear with good BS bilat   Heart: RRR   Extremities: No pretibial edema.   Neuro: MS is good with appropriate affect, pt is alert and Ox3    DM foot exam: 01/30/2024 took place for  The skin of the feet is intact without sores or ulcerations. The pedal pulses are 2+ on right and 2+ on  left. The sensation is intact  to a screening 5.07, 10 gram monofilament bilaterally        DATA REVIEWED:  Lab Results  Component Value Date   HGBA1C 8.4 (A) 09/28/2023   HGBA1C 8.4 (H) 06/22/2023   HGBA1C 7.2 (H) 01/02/2023    Latest Reference Range & Units 06/22/23 12:13  Sodium 134 - 144 mmol/L 142  Potassium 3.5 - 5.2 mmol/L 4.9  Chloride 96 - 106 mmol/L 100  CO2 20 - 29 mmol/L 26  Glucose 70 - 99 mg/dL 823 (H)  BUN 6 - 24 mg/dL 13  Creatinine 9.42 - 8.99 mg/dL 9.26  Calcium  8.7 - 10.2 mg/dL 9.2  BUN/Creatinine Ratio 9 - 23  18  eGFR >59 mL/min/1.73 96    Latest Reference Range & Units 06/22/23 12:13  Total CHOL/HDL Ratio 0.0 - 4.4 ratio 2.2  Cholesterol, Total 100 - 199 mg/dL 884  HDL Cholesterol >60 mg/dL 53  MICROALB/CREAT RATIO 0 - 29 mg/g creat 8  Triglycerides 0 - 149 mg/dL 852  VLDL Cholesterol Cal 5 - 40 mg/dL 25  LDL Chol Calc (NIH) 0 - 99 mg/dL 37    Latest Reference Range & Units 06/22/23 12:13  Glucose 70 - 99 mg/dL 823 (H)  Hemoglobin J8R 4.8 - 5.6 % 8.4 (H)  Est. average glucose Bld gHb Est-mCnc mg/dL 805    Latest Reference Range & Units 06/22/23 12:13  Microalbumin, Urine Not Estab. ug/mL 5.6  MICROALB/CREAT RATIO 0 - 29 mg/g creat 8  Creatinine, Urine Not Estab. mg/dL 33.6   Old records , labs and images have been reviewed.     ASSESSMENT / PLAN / RECOMMENDATIONS:   1) Type 2 Diabetes Mellitus, Poorly controlled, With hx microlabuminuria and macrovascular  complications - Most recent A1c of 9.0%. Goal A1c < 7.0 %.    - Patient has been noting worsening glycemic control, patient admits to dietary indiscretion from August up until early October.  -In reviewing CGM data, patient has been noted with fluctuating BG's and glycemic excursions. -We discussed the importance of taking glipizide  15-20 minutes before the first and last meals of the day -I will decrease Tresiba  to prevent hypoglycemia, patient has been noted with hypoglycemia  overnight -We discontinued Farxiga  due to DKA  MEDICATIONS:  Increase glipizide  10 mg, 1 tablet before breakfast and 1 tablet before supper Continue metformin  750 mg XR daily Continue Ozempic  2 mg weekly Decrease Tresiba  12 units daily  EDUCATION / INSTRUCTIONS: BG monitoring instructions: Patient is instructed to check her blood sugars 3 times a day, before meals. Call [facility] if: BG persistently < 70  I reviewed the Rule of 15 for the treatment of hypoglycemia in detail with the patient. Literature supplied.    2) Diabetic complications:  Eye: Does not have known diabetic retinopathy.  Neuro/ Feet: Does not have known diabetic peripheral neuropathy Renal: Patient does not have known baseline CKD. She  is  on an ACEI/ARB at present.    3) Microalbuminuria:   -This has been intermittently elevated since 2019.  Last checkup was 06/2023 which shows normalization -Losartan  was held due to dizziness and orthostatic hypotension - Dizziness has resolved - We have opted to remain off losartan  since her BP is normal and MA/CR's ratio is normal  F/U in 3 months     Signed electronically by: Stefano Redgie Butts, MD  [facility]  [facility] [address] [place] [place], [place] [address] Phone: [phone] FAX: [phone]   CC: Alphonsa Glendia LABOR, MD [address] [place] [address] Phone: [phone]  Fax: [phone]  Return to Endocrinology clinic as below: Future Appointments  Date Time Provider Department Center  01/30/2024  2:40 PM Arra Connaughton, Donell Redgie, MD [facility] None

## 2024-02-05 LAB — OPHTHALMOLOGY REPORT-SCANNED

## 2024-02-20 LAB — OPHTHALMOLOGY REPORT-SCANNED

## 2024-03-11 ENCOUNTER — Other Ambulatory Visit (HOSPITAL_COMMUNITY): Payer: Self-pay | Admitting: Family Medicine

## 2024-03-11 DIAGNOSIS — Z1231 Encounter for screening mammogram for malignant neoplasm of breast: Secondary | ICD-10-CM

## 2024-03-24 ENCOUNTER — Ambulatory Visit (HOSPITAL_COMMUNITY)
Admission: RE | Admit: 2024-03-24 | Discharge: 2024-03-24 | Disposition: A | Discharge status: Home / Self Care | Source: Ambulatory Visit | Attending: Family Medicine | Admitting: Family Medicine

## 2024-03-24 ENCOUNTER — Other Ambulatory Visit: Payer: Self-pay | Admitting: Family Medicine

## 2024-03-24 DIAGNOSIS — Z1231 Encounter for screening mammogram for malignant neoplasm of breast: Secondary | ICD-10-CM | POA: Diagnosis present

## 2024-03-25 ENCOUNTER — Other Ambulatory Visit (HOSPITAL_COMMUNITY): Payer: Self-pay | Admitting: Family Medicine

## 2024-03-25 DIAGNOSIS — R928 Other abnormal and inconclusive findings on diagnostic imaging of breast: Secondary | ICD-10-CM

## 2024-04-01 ENCOUNTER — Encounter (HOSPITAL_COMMUNITY): Payer: Self-pay

## 2024-04-01 ENCOUNTER — Ambulatory Visit (HOSPITAL_COMMUNITY)
Admission: RE | Admit: 2024-04-01 | Discharge: 2024-04-01 | Disposition: A | Discharge status: Home / Self Care | Source: Ambulatory Visit | Attending: Family Medicine | Admitting: Family Medicine

## 2024-04-01 ENCOUNTER — Ambulatory Visit (HOSPITAL_COMMUNITY): Admission: RE | Admit: 2024-04-01 | Discharge: 2024-04-01 | Attending: Family Medicine | Admitting: Family Medicine

## 2024-04-01 DIAGNOSIS — R928 Other abnormal and inconclusive findings on diagnostic imaging of breast: Secondary | ICD-10-CM | POA: Diagnosis present

## 2024-04-22 ENCOUNTER — Encounter: Payer: Self-pay | Admitting: Internal Medicine

## 2024-04-22 ENCOUNTER — Ambulatory Visit: Admitting: Internal Medicine

## 2024-04-22 VITALS — BP 118/78 | Ht 61.0 in | Wt 141.0 lb

## 2024-04-22 DIAGNOSIS — E1159 Type 2 diabetes mellitus with other circulatory complications: Secondary | ICD-10-CM

## 2024-04-22 DIAGNOSIS — E1165 Type 2 diabetes mellitus with hyperglycemia: Secondary | ICD-10-CM

## 2024-04-22 LAB — POCT GLYCOSYLATED HEMOGLOBIN (HGB A1C): Hemoglobin A1C: 11.5 % — AB (ref 4.0–5.6)

## 2024-04-22 MED ORDER — FREESTYLE LIBRE 3 PLUS SENSOR MISC: 1.0000 | 3 refills | Status: AC

## 2024-04-22 MED ORDER — INSULIN PEN NEEDLE 32G X 4 MM MISC: 1.0000 | Freq: Four times a day (QID) | 3 refills | Status: AC

## 2024-04-22 MED ORDER — NOVOLOG FLEXPEN 100 UNIT/ML ~~LOC~~ SOPN: 4.0000 [IU] | PEN_INJECTOR | Freq: Three times a day (TID) | SUBCUTANEOUS | 11 refills | Status: AC

## 2024-04-22 MED ORDER — METFORMIN HCL ER 750 MG PO TB24: 750.0000 mg | ORAL_TABLET | Freq: Every day | ORAL | 3 refills | Status: AC

## 2024-04-22 MED ORDER — SEMAGLUTIDE (2 MG/DOSE) 8 MG/3ML ~~LOC~~ SOPN: 2.0000 mg | PEN_INJECTOR | SUBCUTANEOUS | 3 refills | Status: AC

## 2024-07-21 ENCOUNTER — Ambulatory Visit: Admitting: Internal Medicine
